# Patient Record
Sex: Female | Born: 1955 | Race: White | Hispanic: No | Marital: Single | Smoking: Current some day smoker
Health system: Southern US, Community
[De-identification: ages and names within clinical notes are randomized; demographics above are authoritative.]

## PROBLEM LIST (undated history)

## (undated) DIAGNOSIS — N289 Disorder of kidney and ureter, unspecified: Secondary | ICD-10-CM

## (undated) DIAGNOSIS — E119 Type 2 diabetes mellitus without complications: Secondary | ICD-10-CM

## (undated) DIAGNOSIS — D751 Secondary polycythemia: Secondary | ICD-10-CM

## (undated) DIAGNOSIS — I10 Essential (primary) hypertension: Secondary | ICD-10-CM

## (undated) DIAGNOSIS — E785 Hyperlipidemia, unspecified: Secondary | ICD-10-CM

## (undated) DIAGNOSIS — S37009A Unspecified injury of unspecified kidney, initial encounter: Secondary | ICD-10-CM

## (undated) HISTORY — DX: Unspecified injury of unspecified kidney, initial encounter: S37.009A

## (undated) HISTORY — PX: BREAST CYST EXCISION: SHX579

## (undated) HISTORY — PX: JOINT REPLACEMENT: SHX530

## (undated) HISTORY — DX: Disorder of kidney and ureter, unspecified: N28.9

## (undated) HISTORY — PX: BREAST CYST ASPIRATION: SHX578

---

## 2001-03-30 DIAGNOSIS — M25569 Pain in unspecified knee: Secondary | ICD-10-CM | POA: Insufficient documentation

## 2003-04-01 DIAGNOSIS — Z9114 Patient's other noncompliance with medication regimen: Secondary | ICD-10-CM | POA: Insufficient documentation

## 2004-05-30 ENCOUNTER — Emergency Department: Payer: Self-pay | Admitting: Emergency Medicine

## 2012-05-12 DIAGNOSIS — K222 Esophageal obstruction: Secondary | ICD-10-CM | POA: Insufficient documentation

## 2012-12-07 DIAGNOSIS — Z720 Tobacco use: Secondary | ICD-10-CM | POA: Insufficient documentation

## 2012-12-07 DIAGNOSIS — Z87898 Personal history of other specified conditions: Secondary | ICD-10-CM | POA: Insufficient documentation

## 2012-12-07 DIAGNOSIS — M549 Dorsalgia, unspecified: Secondary | ICD-10-CM | POA: Insufficient documentation

## 2012-12-07 DIAGNOSIS — Z0000 Encounter for general adult medical examination without abnormal findings: Secondary | ICD-10-CM | POA: Insufficient documentation

## 2012-12-07 DIAGNOSIS — F17200 Nicotine dependence, unspecified, uncomplicated: Secondary | ICD-10-CM | POA: Insufficient documentation

## 2012-12-07 DIAGNOSIS — F419 Anxiety disorder, unspecified: Secondary | ICD-10-CM | POA: Insufficient documentation

## 2012-12-07 DIAGNOSIS — I10 Essential (primary) hypertension: Secondary | ICD-10-CM | POA: Insufficient documentation

## 2013-02-05 ENCOUNTER — Emergency Department: Payer: Self-pay | Admitting: Emergency Medicine

## 2014-01-25 ENCOUNTER — Ambulatory Visit: Payer: Self-pay | Admitting: General Surgery

## 2014-02-02 ENCOUNTER — Ambulatory Visit: Payer: Self-pay | Admitting: General Surgery

## 2014-02-03 ENCOUNTER — Encounter: Payer: Self-pay | Admitting: *Deleted

## 2015-02-15 ENCOUNTER — Other Ambulatory Visit: Payer: Self-pay | Admitting: Orthopedic Surgery

## 2015-02-15 ENCOUNTER — Ambulatory Visit: Payer: Medicaid Other

## 2015-02-15 ENCOUNTER — Other Ambulatory Visit: Payer: Self-pay | Admitting: *Deleted

## 2015-02-15 DIAGNOSIS — M25571 Pain in right ankle and joints of right foot: Secondary | ICD-10-CM

## 2015-02-15 DIAGNOSIS — M25471 Effusion, right ankle: Secondary | ICD-10-CM

## 2015-02-15 DIAGNOSIS — M79671 Pain in right foot: Secondary | ICD-10-CM

## 2015-02-27 ENCOUNTER — Ambulatory Visit
Admission: RE | Admit: 2015-02-27 | Discharge: 2015-02-27 | Disposition: A | Discharge status: Home / Self Care | Payer: Medicaid Other | Source: Ambulatory Visit | Attending: Orthopedic Surgery | Admitting: Orthopedic Surgery

## 2015-02-27 DIAGNOSIS — M65871 Other synovitis and tenosynovitis, right ankle and foot: Secondary | ICD-10-CM | POA: Diagnosis not present

## 2015-02-27 DIAGNOSIS — M94271 Chondromalacia, right ankle and joints of right foot: Secondary | ICD-10-CM | POA: Insufficient documentation

## 2015-02-27 DIAGNOSIS — M25471 Effusion, right ankle: Secondary | ICD-10-CM

## 2015-02-27 DIAGNOSIS — M25571 Pain in right ankle and joints of right foot: Secondary | ICD-10-CM | POA: Diagnosis present

## 2015-10-23 ENCOUNTER — Emergency Department: Payer: Medicaid Other

## 2015-10-23 ENCOUNTER — Encounter: Payer: Self-pay | Admitting: Emergency Medicine

## 2015-10-23 ENCOUNTER — Emergency Department
Admission: EM | Admit: 2015-10-23 | Discharge: 2015-10-23 | Disposition: A | Discharge status: Home / Self Care | Payer: Medicaid Other | Attending: Emergency Medicine | Admitting: Emergency Medicine

## 2015-10-23 DIAGNOSIS — Z79899 Other long term (current) drug therapy: Secondary | ICD-10-CM | POA: Insufficient documentation

## 2015-10-23 DIAGNOSIS — I10 Essential (primary) hypertension: Secondary | ICD-10-CM | POA: Insufficient documentation

## 2015-10-23 DIAGNOSIS — N390 Urinary tract infection, site not specified: Secondary | ICD-10-CM | POA: Insufficient documentation

## 2015-10-23 DIAGNOSIS — F17210 Nicotine dependence, cigarettes, uncomplicated: Secondary | ICD-10-CM | POA: Diagnosis not present

## 2015-10-23 DIAGNOSIS — R600 Localized edema: Secondary | ICD-10-CM | POA: Diagnosis not present

## 2015-10-23 DIAGNOSIS — R2243 Localized swelling, mass and lump, lower limb, bilateral: Secondary | ICD-10-CM | POA: Diagnosis present

## 2015-10-23 HISTORY — DX: Essential (primary) hypertension: I10

## 2015-10-23 LAB — COMPREHENSIVE METABOLIC PANEL
ALT: 25 U/L (ref 14–54)
ANION GAP: 8 (ref 5–15)
AST: 36 U/L (ref 15–41)
Albumin: 3.8 g/dL (ref 3.5–5.0)
Alkaline Phosphatase: 85 U/L (ref 38–126)
BILIRUBIN TOTAL: 1.2 mg/dL (ref 0.3–1.2)
BUN: 10 mg/dL (ref 6–20)
CO2: 31 mmol/L (ref 22–32)
Calcium: 9.6 mg/dL (ref 8.9–10.3)
Chloride: 97 mmol/L — ABNORMAL LOW (ref 101–111)
Creatinine, Ser: 0.95 mg/dL (ref 0.44–1.00)
Glucose, Bld: 130 mg/dL — ABNORMAL HIGH (ref 65–99)
POTASSIUM: 4.6 mmol/L (ref 3.5–5.1)
Sodium: 136 mmol/L (ref 135–145)
TOTAL PROTEIN: 7.3 g/dL (ref 6.5–8.1)

## 2015-10-23 LAB — URINALYSIS COMPLETE WITH MICROSCOPIC (ARMC ONLY)
BILIRUBIN URINE: NEGATIVE
Bacteria, UA: NONE SEEN
Glucose, UA: NEGATIVE mg/dL
HGB URINE DIPSTICK: NEGATIVE
KETONES UR: NEGATIVE mg/dL
NITRITE: NEGATIVE
PH: 5 (ref 5.0–8.0)
PROTEIN: NEGATIVE mg/dL
Specific Gravity, Urine: 1.013 (ref 1.005–1.030)

## 2015-10-23 LAB — CBC WITH DIFFERENTIAL/PLATELET
BASOS ABS: 0 10*3/uL (ref 0–0.1)
Basophils Relative: 0 %
EOS PCT: 4 %
Eosinophils Absolute: 0.3 10*3/uL (ref 0–0.7)
HCT: 46.1 % (ref 35.0–47.0)
HEMOGLOBIN: 15.5 g/dL (ref 12.0–16.0)
LYMPHS PCT: 30 %
Lymphs Abs: 2.4 10*3/uL (ref 1.0–3.6)
MCH: 30.7 pg (ref 26.0–34.0)
MCHC: 33.7 g/dL (ref 32.0–36.0)
MCV: 91.1 fL (ref 80.0–100.0)
Monocytes Absolute: 0.9 10*3/uL (ref 0.2–0.9)
Monocytes Relative: 11 %
NEUTROS ABS: 4.5 10*3/uL (ref 1.4–6.5)
NEUTROS PCT: 55 %
PLATELETS: 202 10*3/uL (ref 150–440)
RBC: 5.06 MIL/uL (ref 3.80–5.20)
RDW: 12.8 % (ref 11.5–14.5)
WBC: 8 10*3/uL (ref 3.6–11.0)

## 2015-10-23 MED ORDER — SULFAMETHOXAZOLE-TRIMETHOPRIM 800-160 MG PO TABS: 1.0000 | ORAL_TABLET | Freq: Two times a day (BID) | ORAL | Status: DC

## 2015-10-23 NOTE — ED Provider Notes (Signed)
[facility] Emergency Department Provider Note  ____________________________________________  Time seen: Approximately 12:48 PM  I have reviewed the triage vital signs and the nursing notes.   HISTORY  Chief Complaint Leg Swelling   HPI Helen Benson is a 59 y.o. female is here with complaint of swelling to her bilateral lower extremities for several days. Patient denies any shortness of breath or chest pain. She states she currently takes a "fluid pill". She states she had an injury to her right ankle years ago which has had some swelling in it that is unusual for her left foot and ankle to be swollen. Patient admits to eating a lot of process foods and salt including "oodles of noodles".Patient continues to smoke one pack of cigarettes per day. Patient states that she is a patient at [facility] family practice but was unable to see her primary care doctor is "she was on vacation". Currently she rates her pain as a 0/10.     Past Medical History  Diagnosis Date  . Hypertension     There are no active problems to display for this patient.   Past Surgical History  Procedure Laterality Date  . Joint replacement      Current Outpatient Rx  Name  Route  Sig  Dispense  Refill  . ALPRAZolam (XANAX) 0.5 MG tablet   Oral   Take 0.5 mg by mouth 3 (three) times daily as needed for anxiety.         Marland Kitchen atenolol (TENORMIN) 25 MG tablet   Oral   Take 25 mg by mouth daily.         . enalapril (VASOTEC) 20 MG tablet   Oral   Take 20 mg by mouth 2 (two) times daily.         . hydrochlorothiazide (HYDRODIURIL) 25 MG tablet   Oral   Take 25 mg by mouth daily.         Marland Kitchen oxyCODONE (OXYCONTIN) 10 mg 12 hr tablet   Oral   Take 10 mg by mouth every 12 (twelve) hours.         . Oxycodone HCl 10 MG TABS   Oral   Take 10 mg by mouth 3 (three) times daily.         Marland Kitchen levothyroxine (SYNTHROID, LEVOTHROID) 112 MCG tablet   Oral   Take 112 mcg by  mouth daily before breakfast.         . sulfamethoxazole-trimethoprim (BACTRIM DS,SEPTRA DS) 800-160 MG tablet   Oral   Take 1 tablet by mouth 2 (two) times daily.   20 tablet   0     Allergies Review of patient's allergies indicates no known allergies.  No family history on file.  Social History Social History  Substance Use Topics  . Smoking status: Current Every Day Smoker -- 1.00 packs/day    Types: Cigarettes  . Smokeless tobacco: None  . Alcohol Use: No    Review of Systems Constitutional: No fever/chills ENT: No sore throat. Cardiovascular: Denies chest pain. Respiratory: Denies shortness of breath. Gastrointestinal: No abdominal pain.  No nausea, no vomiting.  No diarrhea.  No constipation. Genitourinary: Negative for dysuria. Musculoskeletal: Negative for back pain. Positive bilateral leg edema. Skin: Negative for rash. Neurological: Negative for headaches, focal weakness or numbness.  10-point ROS otherwise negative.  ____________________________________________   PHYSICAL EXAM:  VITAL SIGNS: ED Triage Vitals  Enc Vitals Group     BP 10/23/15 1217 145/77 mmHg  Pulse Rate 10/23/15 1217 71     Resp 10/23/15 1217 20     Temp 10/23/15 1217 98.3 F (36.8 C)     Temp Source 10/23/15 1217 Oral     SpO2 10/23/15 1217 95 %     Weight 10/23/15 1217 208 lb (94.348 kg)     Height 10/23/15 1217 5' 3.5" (1.613 m)     Head Cir --      Peak Flow --      Pain Score 10/23/15 1218 0     Pain Loc --      Pain Edu? --      Excl. in [place]? --     Constitutional: Alert and oriented. Well appearing and in no acute distress. Eyes: Conjunctivae are normal. PERRL. EOMI. Head: Atraumatic. Nose: No congestion/rhinnorhea. Mouth/Throat: Mucous membranes are moist.  Oropharynx non-erythematous. Neck: No stridor.   Cardiovascular: Normal rate, regular rhythm. Grossly normal heart sounds.  Good peripheral circulation. Respiratory: Normal respiratory effort.  No  retractions. Lungs Rare expiratory wheeze which clears with coughing. Gastrointestinal: Soft and nontender. No distention. Bowel sounds normoactive 4 quadrants. Musculoskeletal: Bilateral lower extremities with nonpitting edema present. Neurologic:  Normal speech and language. No gross focal neurologic deficits are appreciated. No gait instability. Skin:  Skin is warm, dry and intact. No rash noted. Psychiatric: Mood and affect are normal. Speech and behavior are normal.  ____________________________________________   LABS (all labs ordered are listed, but only abnormal results are displayed)  Labs Reviewed  URINALYSIS COMPLETEWITH MICROSCOPIC ([place]) - Abnormal; Notable for the following:    Color, Urine YELLOW (*)    APPearance CLEAR (*)    Leukocytes, UA 2+ (*)    Squamous Epithelial / LPF 0-5 (*)    All other components within normal limits  COMPREHENSIVE METABOLIC PANEL - Abnormal; Notable for the following:    Chloride 97 (*)    Glucose, Bld 130 (*)    All other components within normal limits  CBC WITH DIFFERENTIAL/PLATELET     RADIOLOGY  Chest x-ray per radiologist shows no active cardiopulmonary disease. ____________________________________________   PROCEDURES  Procedure(s) performed: None  Critical Care performed: No  ____________________________________________   INITIAL IMPRESSION / ASSESSMENT AND PLAN / ED COURSE  Pertinent labs & imaging results that were available during my care of the patient were reviewed by me and considered in my medical decision making (see chart for details).  Patient was given the information about her chest x-ray and lab work. Patient appears to have a urinary tract infection with 2+ leukocytes and 6-30 WBCs. Patient was placed on Bactrim twice a day for 10 days. She is to follow-up with her primary care doctor if any continued problems. She is also encouraged to drink fluids. We also discussed the decrease in heavy sodium  items that she is eating and elevation of her legs for edema. She is continue her medications as regular until seen by her primary care doctor. ____________________________________________   FINAL CLINICAL IMPRESSION(S) / ED DIAGNOSES  Final diagnoses:  Acute UTI (urinary tract infection)  Edema of extremities      NEW MEDICATIONS STARTED DURING THIS VISIT:  Discharge Medication List as of 10/23/2015  2:14 PM    START taking these medications   Details  sulfamethoxazole-trimethoprim (BACTRIM DS,SEPTRA DS) 800-160 MG tablet Take 1 tablet by mouth 2 (two) times daily., Starting 10/23/2015, Until Discontinued, Print         Note:  This document was prepared using Dragon voice recognition  software and may include unintentional dictation errors.    Johnn Hai, PA-C 10/23/15 [place], MD 10/26/15 2039

## 2015-10-23 NOTE — Discharge Instructions (Signed)
Follow-up with her primary care doctor. Call tomorrow to make an appointment. Continue regular medication. Increase fluids. Avoid food with high sodium content. Begin taking Bactrim DS twice a day for 10 days for urinary tract infection. Have this rechecked at your primary care doctor's office to make sure your infection cleared. Elevate legs often to help reduce swelling.

## 2015-10-23 NOTE — ED Notes (Signed)
Patient presents to the ED with bilateral feet swelling.  Patient denies pain and denies shortness of breath.  Patient states she takes a fluid pill daily.  When asked about recent sodium intake patient states, "does oodles of noodles have a lot of salt?".  Patient reports fixed income and eating more processed food at the end of the month.  Patient is alert and oriented x 4.  No obvious distress at this time.  Feet appear swollen but no pitting edema noted, no discoloration and no skin break down.

## 2015-10-23 NOTE — ED Notes (Signed)
Patient transported to XR. 

## 2015-10-23 NOTE — ED Notes (Signed)
States swelling to bilateral lower extremites for several days.. Denies any SOB  Or chest pain   Pt is currently taking fluid pills w/o relief

## 2016-09-24 DIAGNOSIS — B182 Chronic viral hepatitis C: Secondary | ICD-10-CM | POA: Insufficient documentation

## 2016-11-28 ENCOUNTER — Other Ambulatory Visit: Payer: Self-pay | Admitting: Student

## 2016-11-28 DIAGNOSIS — B1920 Unspecified viral hepatitis C without hepatic coma: Secondary | ICD-10-CM

## 2016-12-06 ENCOUNTER — Ambulatory Visit
Admission: RE | Admit: 2016-12-06 | Discharge: 2016-12-06 | Disposition: A | Discharge status: Home / Self Care | Payer: Medicaid Other | Source: Ambulatory Visit | Attending: Student | Admitting: Student

## 2016-12-06 DIAGNOSIS — B1920 Unspecified viral hepatitis C without hepatic coma: Secondary | ICD-10-CM | POA: Insufficient documentation

## 2017-03-03 ENCOUNTER — Inpatient Hospital Stay: Payer: Medicaid Other | Attending: Oncology | Admitting: Oncology

## 2019-04-01 ENCOUNTER — Other Ambulatory Visit: Payer: Self-pay | Admitting: Family Medicine

## 2019-04-01 DIAGNOSIS — Z1231 Encounter for screening mammogram for malignant neoplasm of breast: Secondary | ICD-10-CM

## 2019-04-15 ENCOUNTER — Inpatient Hospital Stay
Admission: RE | Admit: 2019-04-15 | Discharge: 2019-04-15 | Disposition: A | Discharge status: Home / Self Care | Payer: Self-pay | Source: Ambulatory Visit | Attending: *Deleted | Admitting: *Deleted

## 2019-04-15 ENCOUNTER — Other Ambulatory Visit: Payer: Self-pay | Admitting: *Deleted

## 2019-04-15 DIAGNOSIS — Z1231 Encounter for screening mammogram for malignant neoplasm of breast: Secondary | ICD-10-CM

## 2019-04-27 ENCOUNTER — Other Ambulatory Visit: Payer: Self-pay | Admitting: Family Medicine

## 2019-04-27 DIAGNOSIS — N6310 Unspecified lump in the right breast, unspecified quadrant: Secondary | ICD-10-CM

## 2019-04-29 ENCOUNTER — Ambulatory Visit
Admission: RE | Admit: 2019-04-29 | Discharge: 2019-04-29 | Disposition: A | Discharge status: Home / Self Care | Payer: Medicaid Other | Source: Ambulatory Visit | Attending: Family Medicine | Admitting: Family Medicine

## 2019-04-29 DIAGNOSIS — N6310 Unspecified lump in the right breast, unspecified quadrant: Secondary | ICD-10-CM

## 2019-04-29 DIAGNOSIS — N6311 Unspecified lump in the right breast, upper outer quadrant: Secondary | ICD-10-CM | POA: Insufficient documentation

## 2019-04-29 DIAGNOSIS — N6315 Unspecified lump in the right breast, overlapping quadrants: Secondary | ICD-10-CM | POA: Insufficient documentation

## 2019-09-14 ENCOUNTER — Other Ambulatory Visit: Payer: Self-pay | Admitting: Family Medicine

## 2019-09-14 ENCOUNTER — Other Ambulatory Visit (HOSPITAL_COMMUNITY): Payer: Self-pay | Admitting: Family Medicine

## 2019-09-14 DIAGNOSIS — R1011 Right upper quadrant pain: Secondary | ICD-10-CM

## 2019-09-14 DIAGNOSIS — R109 Unspecified abdominal pain: Secondary | ICD-10-CM

## 2019-09-14 DIAGNOSIS — R110 Nausea: Secondary | ICD-10-CM

## 2019-09-27 ENCOUNTER — Encounter: Admission: RE | Admit: 2019-09-27 | Payer: Medicaid Other | Source: Ambulatory Visit

## 2019-09-30 ENCOUNTER — Encounter
Admission: RE | Admit: 2019-09-30 | Discharge: 2019-09-30 | Disposition: A | Discharge status: Home / Self Care | Payer: Medicaid Other | Source: Ambulatory Visit | Attending: Family Medicine | Admitting: Family Medicine

## 2019-09-30 ENCOUNTER — Other Ambulatory Visit: Payer: Self-pay

## 2019-09-30 DIAGNOSIS — R110 Nausea: Secondary | ICD-10-CM

## 2019-09-30 DIAGNOSIS — R1011 Right upper quadrant pain: Secondary | ICD-10-CM

## 2019-09-30 DIAGNOSIS — R109 Unspecified abdominal pain: Secondary | ICD-10-CM | POA: Diagnosis not present

## 2019-09-30 MED ORDER — TECHNETIUM TC 99M MEBROFENIN IV KIT
5.0000 | PACK | Freq: Once | INTRAVENOUS | Status: AC | PRN
Start: 2019-09-30 — End: 2019-09-30
  Administered 2019-09-30: 08:00:00 5.49 via INTRAVENOUS

## 2020-02-28 ENCOUNTER — Other Ambulatory Visit: Payer: Self-pay | Admitting: Physician Assistant

## 2020-02-28 ENCOUNTER — Ambulatory Visit
Admission: RE | Admit: 2020-02-28 | Discharge: 2020-02-28 | Disposition: A | Discharge status: Home / Self Care | Payer: Medicaid Other | Source: Ambulatory Visit | Attending: Physician Assistant | Admitting: Physician Assistant

## 2020-02-28 ENCOUNTER — Ambulatory Visit
Admission: RE | Admit: 2020-02-28 | Discharge: 2020-02-28 | Disposition: A | Discharge status: Home / Self Care | Payer: Medicaid Other | Attending: Physician Assistant | Admitting: Physician Assistant

## 2020-02-28 DIAGNOSIS — M545 Low back pain, unspecified: Secondary | ICD-10-CM

## 2020-06-06 ENCOUNTER — Other Ambulatory Visit: Payer: Self-pay | Admitting: Family Medicine

## 2020-06-06 DIAGNOSIS — Z1231 Encounter for screening mammogram for malignant neoplasm of breast: Secondary | ICD-10-CM

## 2020-09-14 IMAGING — MG DIGITAL DIAGNOSTIC BILAT W/ TOMO W/ CAD
8 of 15 series · 8 of 40 positions shown · non-contrast
Comparison: Previous exam(s).

CLINICAL DATA: The patient presents with multiple palpable lumps in
both breast.

EXAM:
DIGITAL DIAGNOSTIC BILATERAL MAMMOGRAM WITH CAD AND TOMO
ULTRASOUND BILATERAL BREAST

[R CC synth-2D]
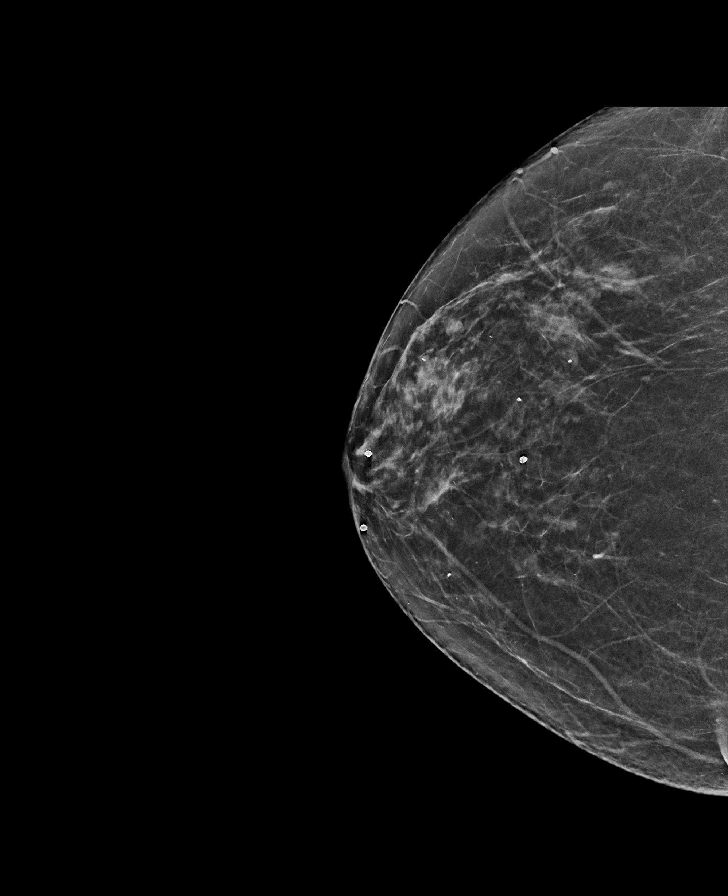

[L MLO synth-2D]
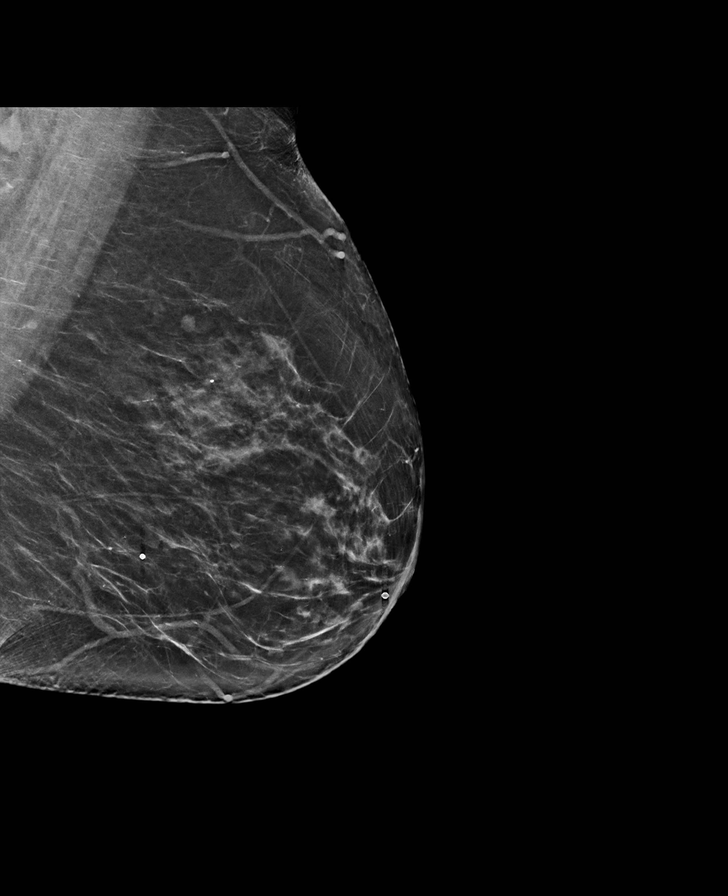

[L CC synth-2D]
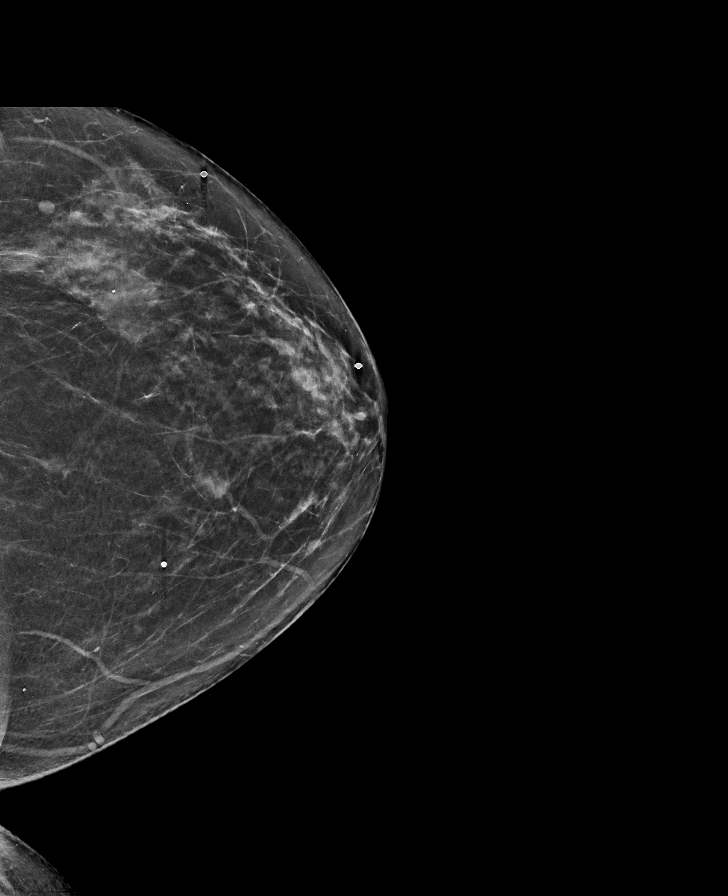

[L TAN synth-2D (1 of 2)]
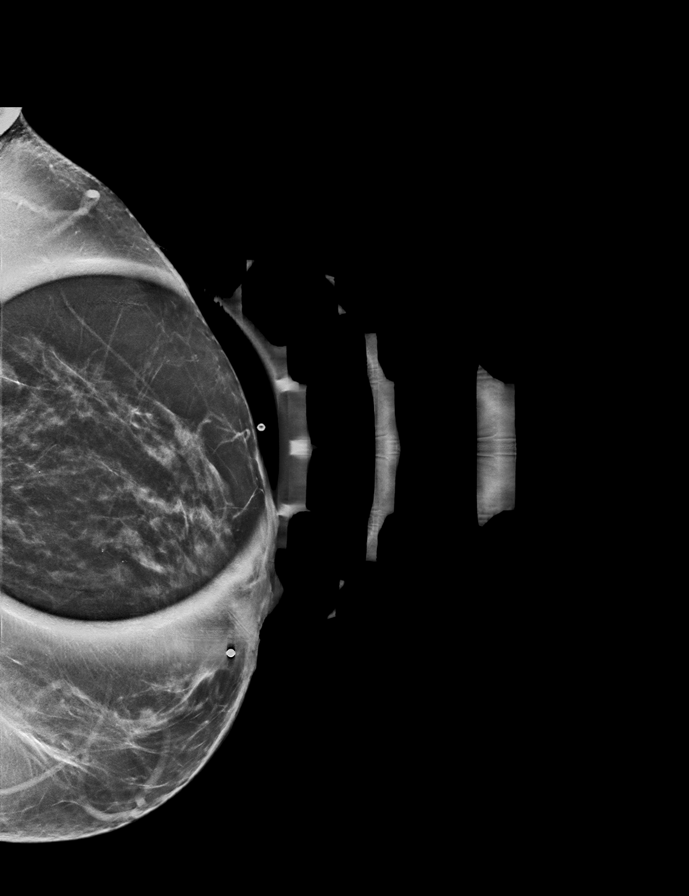

[R MLO synth-2D]
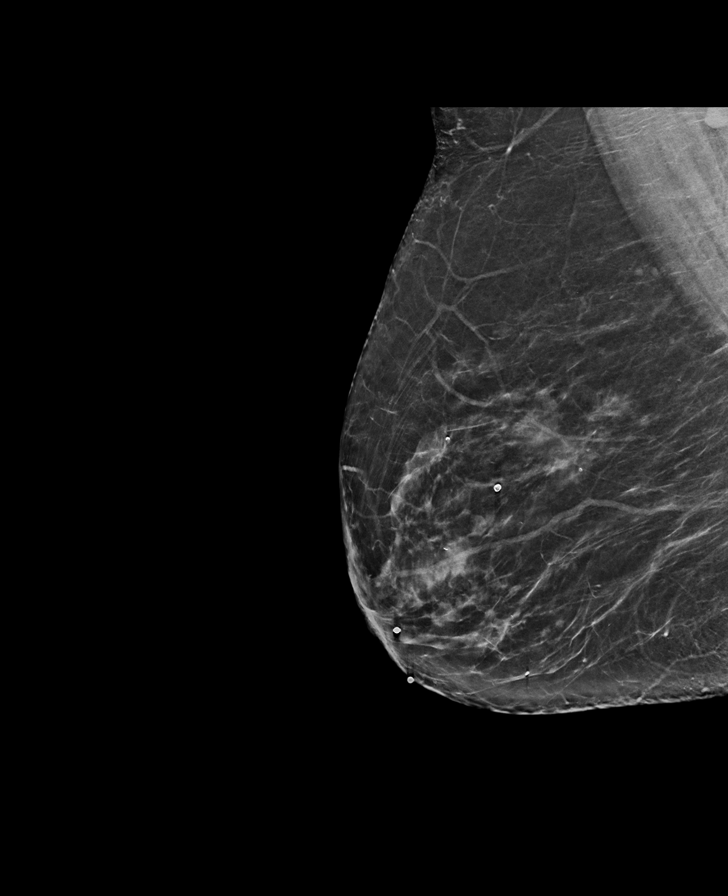

[L TAN synth-2D (2 of 2)]
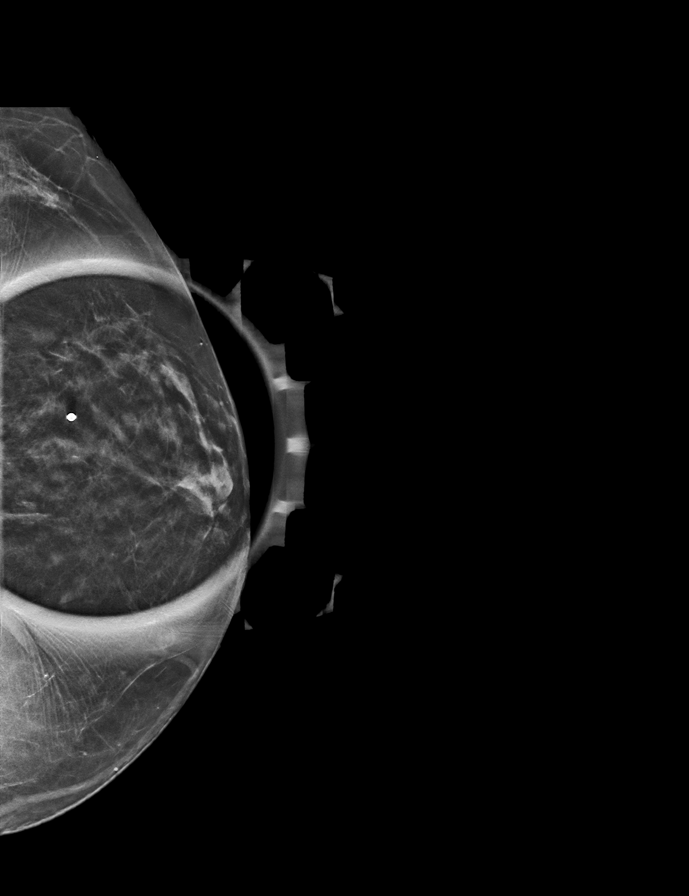

[R TAN synth-2D]
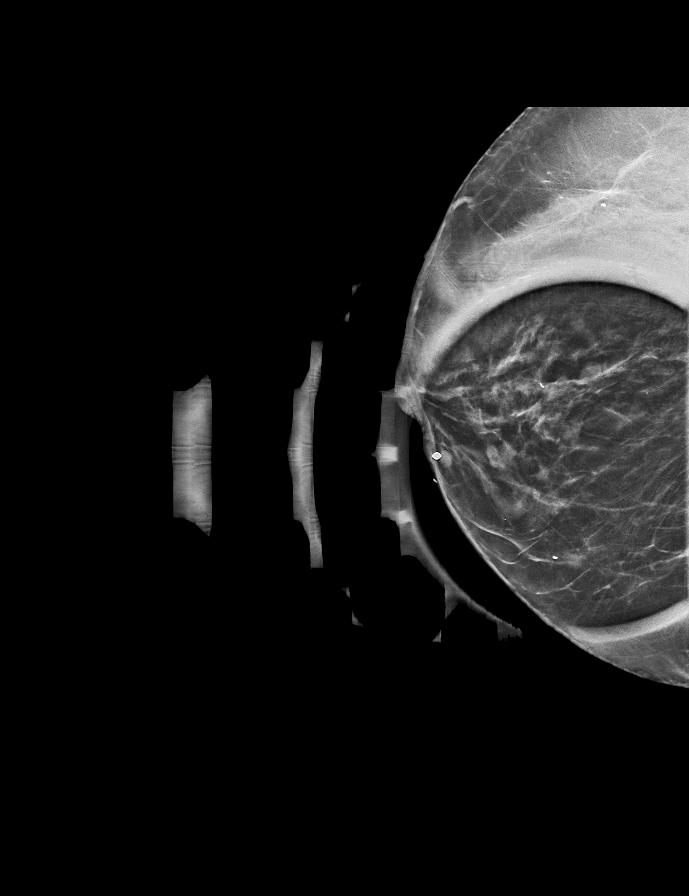

[L TAN tomo · tomo slice 28/41.0]
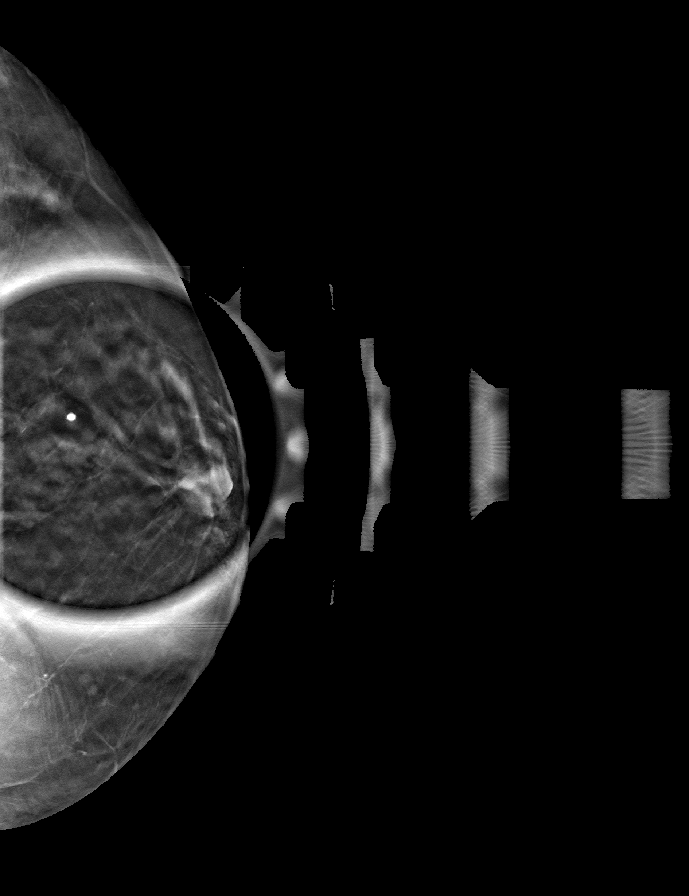

[8 of 40 positions shown; findings below may reference images not displayed]

ACR Breast Density Category c: The breast tissue is heterogeneously
dense, which may obscure small masses.
FINDINGS: There is a new mass in the lateral inferior right breast which does
not correlate with any of the palpable lumps. No other mammographic
abnormalities are identified bilaterally.

Mammographic images were processed with CAD.

On physical exam, no suspicious lumps are identified.

Targeted ultrasound is performed, showing a mildly complicated cyst
in the right breast at 8 o'clock correlating with the new mass
identified. No sonographic correlate for the patient's palpable
lumps.
IMPRESSION: Fibrocystic changes.  No evidence of malignancy.

RECOMMENDATION:
Annual screening mammography.

I have discussed the findings and recommendations with the patient.
If applicable, a reminder letter will be sent to the patient
regarding the next appointment.

BI-RADS CATEGORY  2: Benign.

## 2021-01-08 ENCOUNTER — Ambulatory Visit: Payer: Medicare Other

## 2021-01-08 ENCOUNTER — Inpatient Hospital Stay: Payer: Medicare Other | Attending: Oncology | Admitting: Oncology

## 2021-01-08 ENCOUNTER — Other Ambulatory Visit: Payer: Self-pay

## 2021-01-08 ENCOUNTER — Encounter: Payer: Self-pay | Admitting: Oncology

## 2021-01-08 ENCOUNTER — Inpatient Hospital Stay: Payer: Medicare Other | Admitting: Oncology

## 2021-01-08 ENCOUNTER — Inpatient Hospital Stay: Payer: Medicare Other

## 2021-01-08 VITALS — BP 173/78 | HR 66 | Temp 98.5°F | Resp 18 | Wt 214.0 lb

## 2021-01-08 DIAGNOSIS — I82409 Acute embolism and thrombosis of unspecified deep veins of unspecified lower extremity: Secondary | ICD-10-CM | POA: Insufficient documentation

## 2021-01-08 DIAGNOSIS — Z79899 Other long term (current) drug therapy: Secondary | ICD-10-CM | POA: Diagnosis not present

## 2021-01-08 DIAGNOSIS — Z720 Tobacco use: Secondary | ICD-10-CM

## 2021-01-08 DIAGNOSIS — D751 Secondary polycythemia: Secondary | ICD-10-CM | POA: Insufficient documentation

## 2021-01-08 DIAGNOSIS — I10 Essential (primary) hypertension: Secondary | ICD-10-CM | POA: Diagnosis not present

## 2021-01-08 DIAGNOSIS — E139 Other specified diabetes mellitus without complications: Secondary | ICD-10-CM | POA: Insufficient documentation

## 2021-01-08 DIAGNOSIS — Z803 Family history of malignant neoplasm of breast: Secondary | ICD-10-CM | POA: Insufficient documentation

## 2021-01-08 DIAGNOSIS — F17210 Nicotine dependence, cigarettes, uncomplicated: Secondary | ICD-10-CM | POA: Insufficient documentation

## 2021-01-08 LAB — COMPREHENSIVE METABOLIC PANEL
ALT: 16 U/L (ref 0–44)
AST: 14 U/L — ABNORMAL LOW (ref 15–41)
Albumin: 4 g/dL (ref 3.5–5.0)
Alkaline Phosphatase: 68 U/L (ref 38–126)
Anion gap: 11 (ref 5–15)
BUN: 22 mg/dL (ref 8–23)
CO2: 31 mmol/L (ref 22–32)
Calcium: 10 mg/dL (ref 8.9–10.3)
Chloride: 93 mmol/L — ABNORMAL LOW (ref 98–111)
Creatinine, Ser: 1.06 mg/dL — ABNORMAL HIGH (ref 0.44–1.00)
GFR, Estimated: 58 mL/min — ABNORMAL LOW (ref >60)
Glucose, Bld: 169 mg/dL — ABNORMAL HIGH (ref 70–99)
Potassium: 3 mmol/L — ABNORMAL LOW (ref 3.5–5.1)
Sodium: 135 mmol/L (ref 135–145)
Total Bilirubin: 0.4 mg/dL (ref 0.3–1.2)
Total Protein: 7.5 g/dL (ref 6.5–8.1)

## 2021-01-08 LAB — CBC WITH DIFFERENTIAL/PLATELET
Abs Immature Granulocytes: 0.02 10*3/uL (ref 0.00–0.07)
Basophils Absolute: 0.1 10*3/uL (ref 0.0–0.1)
Basophils Relative: 1 %
Eosinophils Absolute: 0.2 10*3/uL (ref 0.0–0.5)
Eosinophils Relative: 2 %
HCT: 45.5 % (ref 36.0–46.0)
Hemoglobin: 15.5 g/dL — ABNORMAL HIGH (ref 12.0–15.0)
Immature Granulocytes: 0 %
Lymphocytes Relative: 31 %
Lymphs Abs: 3.1 10*3/uL (ref 0.7–4.0)
MCH: 31.9 pg (ref 26.0–34.0)
MCHC: 34.1 g/dL (ref 30.0–36.0)
MCV: 93.6 fL (ref 80.0–100.0)
Monocytes Absolute: 0.8 10*3/uL (ref 0.1–1.0)
Monocytes Relative: 8 %
Neutro Abs: 5.7 10*3/uL (ref 1.7–7.7)
Neutrophils Relative %: 58 %
Platelets: 271 10*3/uL (ref 150–400)
RBC: 4.86 MIL/uL (ref 3.87–5.11)
RDW: 12.4 % (ref 11.5–15.5)
WBC: 10 10*3/uL (ref 4.0–10.5)
nRBC: 0 % (ref 0.0–0.2)

## 2021-01-08 NOTE — Progress Notes (Signed)
Patient here for initial oncology appointment, expresses concerns of fatigue 

## 2021-01-08 NOTE — Progress Notes (Signed)
Hematology/Oncology Consult note [facility] [phone] [phone]   Patient Care Team: Remi Haggard, FNP as PCP - General (Family Medicine)  REFERRING PROVIDER: Remi Haggard, FNP  CHIEF COMPLAINTS/REASON FOR VISIT:  Evaluation of polycytosis/erythrocytosis  HISTORY OF PRESENTING ILLNESS:  Helen Benson is a 65 y.o. female who was seen in consultation at the request of Remi Haggard, FNP for evaluation of polycytosis/erythrocytosis Reviewed patient's recent lab work which was obtain by PCP.  Labs showed elevated hemoglobin at 16s,   Denies weight loss, fever, chills, fatigue, night sweats.   Smoking history: current every day smoker, 1 pack per day.  History of blood clots: remote provoked DVT after knee surgery, anticoagulation with coumadin.   Review of Systems  Constitutional:  Negative for appetite change, chills, fatigue and fever.  HENT:   Negative for hearing loss and voice change.   Eyes:  Negative for eye problems.  Respiratory:  Negative for chest tightness and cough.   Cardiovascular:  Negative for chest pain.  Gastrointestinal:  Negative for abdominal distention, abdominal pain and blood in stool.  Endocrine: Negative for hot flashes.  Genitourinary:  Negative for difficulty urinating and frequency.   Musculoskeletal:  Negative for arthralgias.  Skin:  Negative for itching and rash.  Neurological:  Negative for extremity weakness.  Hematological:  Negative for adenopathy.  Psychiatric/Behavioral:  Negative for confusion.    MEDICAL HISTORY:  Past Medical History:  Diagnosis Date   Hypertension    Kidney damage     SURGICAL HISTORY: Past Surgical History:  Procedure Laterality Date   BREAST CYST ASPIRATION Bilateral    Bilat. many on both per pt   BREAST CYST EXCISION Left    age 34   JOINT REPLACEMENT      SOCIAL HISTORY: Social History   Socioeconomic History   Marital status: Single     Spouse name: Not on file   Number of children: Not on file   Years of education: Not on file   Highest education level: Not on file  Occupational History   Not on file  Tobacco Use   Smoking status: Every Day    Packs/day: 1.00    Types: Cigarettes   Smokeless tobacco: Not on file  Substance and Sexual Activity   Alcohol use: No   Drug use: Not on file   Sexual activity: Not on file  Other Topics Concern   Not on file  Social History Narrative   Not on file   Social Determinants of Health   Financial Resource Strain: Not on file  Food Insecurity: Not on file  Transportation Needs: Not on file  Physical Activity: Not on file  Stress: Not on file  Social Connections: Not on file  Intimate Partner Violence: Not on file    FAMILY HISTORY: Family History  Problem Relation Age of Onset   Breast cancer Sister 60   Breast cancer Other 42       ne[place]    ALLERGIES:  has No Known Allergies.  MEDICATIONS:  Current Outpatient Medications  Medication Sig Dispense Refill   ALPRAZolam (XANAX) 0.5 MG tablet Take 0.5 mg by mouth 3 (three) times daily as needed for anxiety.     atenolol (TENORMIN) 25 MG tablet Take 25 mg by mouth daily.     atorvastatin (LIPITOR) 20 MG tablet Take 20 mg by mouth daily.     chlorthalidone (HYGROTON) 50 MG tablet Take 50 mg by mouth daily.  hydrochlorothiazide (HYDRODIURIL) 25 MG tablet Take 25 mg by mouth daily.     Investigational vitamin D 600 UNITS capsule SWOG S0812 Take 600 Units by mouth daily. Take with food.     levothyroxine (SYNTHROID, LEVOTHROID) 112 MCG tablet Take 112 mcg by mouth daily before breakfast.     oxyCODONE (OXYCONTIN) 10 mg 12 hr tablet Take 10 mg by mouth every 12 (twelve) hours.     Oxycodone HCl 10 MG TABS Take 10 mg by mouth 3 (three) times daily.     enalapril (VASOTEC) 20 MG tablet Take 20 mg by mouth 2 (two) times daily. (Patient not taking: Reported on 01/08/2021)     sulfamethoxazole-trimethoprim (BACTRIM  DS,SEPTRA DS) 800-160 MG tablet Take 1 tablet by mouth 2 (two) times daily. (Patient not taking: Reported on 01/08/2021) 20 tablet 0   No current facility-administered medications for this visit.     PHYSICAL EXAMINATION: ECOG PERFORMANCE STATUS: 0 - Asymptomatic Vitals:   01/08/21 1555  BP: (!) 173/78  Pulse: 66  Resp: 18  Temp: 98.5 F (36.9 C)  SpO2: 98%   Filed Weights   01/08/21 1555  Weight: 214 lb (97.1 kg)    Physical Exam Constitutional:      General: She is not in acute distress. HENT:     Head: Normocephalic and atraumatic.  Eyes:     General: No scleral icterus. Cardiovascular:     Rate and Rhythm: Normal rate and regular rhythm.     Heart sounds: Normal heart sounds.  Pulmonary:     Effort: Pulmonary effort is normal. No respiratory distress.     Breath sounds: No wheezing.  Abdominal:     General: Bowel sounds are normal. There is no distension.     Palpations: Abdomen is soft.  Musculoskeletal:        General: No deformity. Normal range of motion.     Cervical back: Normal range of motion and neck supple.  Skin:    General: Skin is warm and dry.     Findings: No erythema or rash.  Neurological:     Mental Status: She is alert and oriented to person, place, and time. Mental status is at baseline.     Cranial Nerves: No cranial nerve deficit.     Coordination: Coordination normal.  Psychiatric:        Mood and Affect: Mood normal.    RADIOGRAPHIC STUDIES: I have personally reviewed the radiological images as listed and agreed with the findings in the report. No results found.   LABORATORY DATA:  I have reviewed the data as listed Lab Results  Component Value Date   WBC 10.0 01/08/2021   HGB 15.5 (H) 01/08/2021   HCT 45.5 01/08/2021   MCV 93.6 01/08/2021   PLT 271 01/08/2021   Recent Labs    01/08/21 1626  NA 135  K 3.0*  CL 93*  CO2 31  GLUCOSE 169*  BUN 22  CREATININE 1.06*  CALCIUM 10.0  GFRNONAA 58*  PROT 7.5  ALBUMIN 4.0   AST 14*  ALT 16  ALKPHOS 68  BILITOT 0.4   Iron/TIBC/Ferritin/ %Sat No results found for: IRON, TIBC, FERRITIN, IRONPCTSAT      ASSESSMENT & PLAN:  1. Erythrocytosis   2. Tobacco abuse   Labs are reviewed and discussed with patient. Polycythemia (erythrocytosis) is an abnormal elevation of hemoglobin (Hgb) and/or hematocrit (Hct) in peripheral blood, and this can be caused by primary etiology, ie bone marrow mutation, or secondary etiology, ie  hypoxia, chronic lung disease, sleep apnea, smoking etc.  I will obtain erythropoietin, carbo monoxide level, rule out primary etiology, JAK2 with reflex to other mutations, BCR-ABL.    Tobacco use, smoke cessation was discussed  Orders Placed This Encounter  Procedures   CBC with Differential/Platelet    Standing Status:   Future    Number of Occurrences:   1    Standing Expiration Date:   01/08/2022   Comprehensive metabolic panel    Standing Status:   Future    Number of Occurrences:   1    Standing Expiration Date:   01/08/2022   JAK2 V617F, w Reflex to CALR/E12/MPL    Standing Status:   Future    Number of Occurrences:   1    Standing Expiration Date:   01/08/2022   BCR-ABL1 FISH    Standing Status:   Future    Number of Occurrences:   1    Standing Expiration Date:   01/08/2022   Carbon monoxide, blood (performed at ref lab)    Standing Status:   Future    Number of Occurrences:   1    Standing Expiration Date:   01/08/2022   Erythropoietin    Standing Status:   Future    Number of Occurrences:   1    Standing Expiration Date:   01/08/2022    We spent sufficient time to discuss many aspect of care, questions were answered to patient's satisfaction. The patient knows to call the clinic with any problems questions or concerns.  Cc Remi Haggard, FNP  Return of visit: 2 weeks Thank you for this kind referral and the opportunity to participate in the care of this patient. A copy of today's note is routed to referring  provider    Earlie Server, MD, PhD 01/08/2021

## 2021-01-09 LAB — ERYTHROPOIETIN: Erythropoietin: 25.4 m[IU]/mL — ABNORMAL HIGH (ref 2.6–18.5)

## 2021-01-09 LAB — CARBON MONOXIDE, BLOOD (PERFORMED AT REF LAB): Carbon Monoxide, Blood: 9.7 % — ABNORMAL HIGH (ref 0.0–3.6)

## 2021-01-11 LAB — BCR-ABL1 FISH
Cells Analyzed: 200
Cells Counted: 200

## 2021-01-16 LAB — CALR + JAK2 E12-15 + MPL (REFLEXED)

## 2021-01-16 LAB — JAK2 V617F, W REFLEX TO CALR/E12/MPL

## 2021-01-22 ENCOUNTER — Inpatient Hospital Stay: Payer: Medicare Other | Admitting: Oncology

## 2021-01-24 ENCOUNTER — Inpatient Hospital Stay: Payer: Medicare Other | Admitting: Oncology

## 2021-02-13 ENCOUNTER — Encounter: Payer: Self-pay | Admitting: Oncology

## 2021-02-13 ENCOUNTER — Inpatient Hospital Stay: Payer: Medicare Other | Attending: Oncology | Admitting: Oncology

## 2021-02-13 VITALS — BP 167/84 | HR 71 | Temp 97.8°F | Resp 20 | Wt 221.5 lb

## 2021-02-13 DIAGNOSIS — Z803 Family history of malignant neoplasm of breast: Secondary | ICD-10-CM | POA: Diagnosis not present

## 2021-02-13 DIAGNOSIS — Z79899 Other long term (current) drug therapy: Secondary | ICD-10-CM | POA: Diagnosis not present

## 2021-02-13 DIAGNOSIS — I10 Essential (primary) hypertension: Secondary | ICD-10-CM | POA: Diagnosis not present

## 2021-02-13 DIAGNOSIS — D751 Secondary polycythemia: Secondary | ICD-10-CM

## 2021-02-13 DIAGNOSIS — F17210 Nicotine dependence, cigarettes, uncomplicated: Secondary | ICD-10-CM | POA: Insufficient documentation

## 2021-02-13 DIAGNOSIS — Z720 Tobacco use: Secondary | ICD-10-CM

## 2021-02-13 NOTE — Progress Notes (Signed)
Hematology/Oncology follow up note [facility] [phone] [phone]   Patient Care Team: Remi Haggard, FNP as PCP - General (Family Medicine)  REFERRING PROVIDER: Remi Haggard, FNP  CHIEF COMPLAINTS/REASON FOR VISIT:   polycytosis/erythrocytosis  HISTORY OF PRESENTING ILLNESS:  Helen Benson is a 65 y.o. female who was seen in consultation at the request of Remi Haggard, FNP for evaluation of polycytosis/erythrocytosis Reviewed patient's recent lab work which was obtain by PCP.  Labs showed elevated hemoglobin at 16s,   Denies weight loss, fever, chills, fatigue, night sweats.   Smoking history: current every day smoker, 1 pack per day.  History of blood clots: remote provoked DVT after knee surgery, anticoagulation with coumadin.  INTERVAL HISTORY Helen Benson is a 64 y.o. female who has above history reviewed by me today presents for follow up visit for erythrocytosis. She had blood work done and presents to discuss results. No new complaints.     Review of Systems  Constitutional:  Negative for appetite change, chills, fatigue and fever.  HENT:   Negative for hearing loss and voice change.   Eyes:  Negative for eye problems.  Respiratory:  Negative for chest tightness and cough.   Cardiovascular:  Negative for chest pain.  Gastrointestinal:  Negative for abdominal distention, abdominal pain and blood in stool.  Endocrine: Negative for hot flashes.  Genitourinary:  Negative for difficulty urinating and frequency.   Musculoskeletal:  Negative for arthralgias.  Skin:  Negative for itching and rash.  Neurological:  Negative for extremity weakness.  Hematological:  Negative for adenopathy.  Psychiatric/Behavioral:  Negative for confusion.    MEDICAL HISTORY:  Past Medical History:  Diagnosis Date   Hypertension    Kidney damage     SURGICAL HISTORY: Past Surgical History:  Procedure Laterality Date    BREAST CYST ASPIRATION Bilateral    Bilat. many on both per pt   BREAST CYST EXCISION Left    age 35   JOINT REPLACEMENT      SOCIAL HISTORY: Social History   Socioeconomic History   Marital status: Single    Spouse name: Not on file   Number of children: Not on file   Years of education: Not on file   Highest education level: Not on file  Occupational History   Not on file  Tobacco Use   Smoking status: Every Day    Packs/day: 1.00    Years: 40.00    Pack years: 40.00    Types: Cigarettes   Smokeless tobacco: Never  Substance and Sexual Activity   Alcohol use: No   Drug use: Not on file   Sexual activity: Not on file  Other Topics Concern   Not on file  Social History Narrative   Not on file   Social Determinants of Health   Financial Resource Strain: Not on file  Food Insecurity: Not on file  Transportation Needs: Not on file  Physical Activity: Not on file  Stress: Not on file  Social Connections: Not on file  Intimate Partner Violence: Not on file    FAMILY HISTORY: Family History  Problem Relation Age of Onset   Breast cancer Sister 59   Breast cancer Other 42       neice    ALLERGIES:  has No Known Allergies.  MEDICATIONS:  Current Outpatient Medications  Medication Sig Dispense Refill   ALPRAZolam (XANAX) 0.5 MG tablet Take 0.5 mg by mouth 3 (three) times daily as needed  for anxiety.     atenolol (TENORMIN) 25 MG tablet Take 25 mg by mouth daily.     chlorthalidone (HYGROTON) 50 MG tablet Take 50 mg by mouth daily.     Investigational vitamin D 600 UNITS capsule SWOG S0812 Take 600 Units by mouth daily. Take with food.     levothyroxine (SYNTHROID, LEVOTHROID) 112 MCG tablet Take 70 mcg by mouth daily before breakfast.     losartan (COZAAR) 50 MG tablet losartan 50 mg tablet     naloxone (NARCAN) nasal spray 4 mg/0.1 mL      oxyCODONE ER (XTAMPZA ER) 18 MG C12A Xtampza ER 18 mg capsule sprinkle     Oxycodone HCl 10 MG TABS Take 10 mg by mouth 3  (three) times daily.     atorvastatin (LIPITOR) 20 MG tablet Take 20 mg by mouth daily.     enalapril (VASOTEC) 20 MG tablet Take 20 mg by mouth 2 (two) times daily. (Patient not taking: Reported on 01/08/2021)     hydrochlorothiazide (HYDRODIURIL) 25 MG tablet Take 25 mg by mouth daily.     oxyCODONE (OXYCONTIN) 10 mg 12 hr tablet Take 10 mg by mouth every 12 (twelve) hours.     sulfamethoxazole-trimethoprim (BACTRIM DS,SEPTRA DS) 800-160 MG tablet Take 1 tablet by mouth 2 (two) times daily. (Patient not taking: Reported on 01/08/2021) 20 tablet 0   No current facility-administered medications for this visit.     PHYSICAL EXAMINATION: ECOG PERFORMANCE STATUS: 0 - Asymptomatic Vitals:   02/13/21 1409  BP: (!) 167/84  Pulse: 71  Resp: 20  Temp: 97.8 F (36.6 C)  SpO2: 100%   Filed Weights   02/13/21 1409  Weight: 221 lb 8 oz (100.5 kg)    Physical Exam Constitutional:      General: She is not in acute distress. HENT:     Head: Normocephalic and atraumatic.  Eyes:     General: No scleral icterus. Cardiovascular:     Rate and Rhythm: Normal rate and regular rhythm.     Heart sounds: Normal heart sounds.  Pulmonary:     Effort: Pulmonary effort is normal. No respiratory distress.     Breath sounds: No wheezing.  Abdominal:     General: Bowel sounds are normal. There is no distension.     Palpations: Abdomen is soft.  Musculoskeletal:        General: No deformity. Normal range of motion.     Cervical back: Normal range of motion and neck supple.  Skin:    General: Skin is warm and dry.     Findings: No erythema or rash.  Neurological:     Mental Status: She is alert and oriented to person, place, and time. Mental status is at baseline.     Cranial Nerves: No cranial nerve deficit.     Coordination: Coordination normal.  Psychiatric:        Mood and Affect: Mood normal.    RADIOGRAPHIC STUDIES: I have personally reviewed the radiological images as listed and agreed  with the findings in the report. No results found.   LABORATORY DATA:  I have reviewed the data as listed Lab Results  Component Value Date   WBC 10.0 01/08/2021   HGB 15.5 (H) 01/08/2021   HCT 45.5 01/08/2021   MCV 93.6 01/08/2021   PLT 271 01/08/2021   Recent Labs    01/08/21 1626  NA 135  K 3.0*  CL 93*  CO2 31  GLUCOSE 169*  BUN 22  CREATININE 1.06*  CALCIUM 10.0  GFRNONAA 58*  PROT 7.5  ALBUMIN 4.0  AST 14*  ALT 16  ALKPHOS 68  BILITOT 0.4    Iron/TIBC/Ferritin/ %Sat No results found for: IRON, TIBC, FERRITIN, IRONPCTSAT      ASSESSMENT & PLAN:  1. Erythrocytosis   2. Tobacco abuse    Labs are reviewed and discussed with patient. JAK2 V617F mutation negative, with reflex to other mutations CALR, MPL, JAK 2 Ex 12-15 mutations negative. 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  Smoking cessation was discussed.   Refer to lung cancer screening program.    Orders Placed This Encounter  Procedures   Ambulatory Referral for Lung Cancer Screening    Referral Priority:   Routine    Referral Type:   Consultation    Referral Reason:   Specialty Services Required    Number of Visits Requested:   1    We spent sufficient time to discuss many aspect of care, questions were answered to patient's satisfaction. The patient knows to call the clinic with any problems questions or concerns.  Cc Remi Haggard, FNP  Return of visit: 6 months   Earlie Server, MD, PhD 02/13/2021

## 2021-03-28 ENCOUNTER — Other Ambulatory Visit: Payer: Self-pay | Admitting: *Deleted

## 2021-03-28 DIAGNOSIS — F17210 Nicotine dependence, cigarettes, uncomplicated: Secondary | ICD-10-CM

## 2021-03-28 DIAGNOSIS — Z87891 Personal history of nicotine dependence: Secondary | ICD-10-CM

## 2021-04-24 ENCOUNTER — Telehealth: Payer: Medicare Other | Admitting: Acute Care

## 2021-04-24 ENCOUNTER — Ambulatory Visit: Admission: RE | Admit: 2021-04-24 | Payer: Medicare Other | Source: Ambulatory Visit

## 2021-05-27 LAB — HM DIABETES EYE EXAM

## 2021-06-21 ENCOUNTER — Other Ambulatory Visit: Payer: Self-pay | Admitting: Family Medicine

## 2021-06-21 DIAGNOSIS — Z1231 Encounter for screening mammogram for malignant neoplasm of breast: Secondary | ICD-10-CM

## 2021-07-25 ENCOUNTER — Other Ambulatory Visit: Payer: Self-pay | Admitting: Family Medicine

## 2021-07-25 DIAGNOSIS — F17200 Nicotine dependence, unspecified, uncomplicated: Secondary | ICD-10-CM

## 2021-08-06 ENCOUNTER — Other Ambulatory Visit: Payer: Self-pay | Admitting: *Deleted

## 2021-08-06 DIAGNOSIS — D751 Secondary polycythemia: Secondary | ICD-10-CM

## 2021-08-13 ENCOUNTER — Inpatient Hospital Stay: Payer: Medicare Other | Admitting: Nurse Practitioner

## 2021-08-13 ENCOUNTER — Inpatient Hospital Stay: Payer: Medicare Other

## 2021-09-03 ENCOUNTER — Encounter: Payer: Self-pay | Admitting: Oncology

## 2021-09-03 ENCOUNTER — Inpatient Hospital Stay: Payer: Medicare Other | Attending: Oncology | Admitting: Nurse Practitioner

## 2021-09-03 ENCOUNTER — Inpatient Hospital Stay: Payer: Medicare Other

## 2022-01-02 DIAGNOSIS — N1831 Chronic kidney disease, stage 3a: Secondary | ICD-10-CM | POA: Insufficient documentation

## 2022-01-02 DIAGNOSIS — E039 Hypothyroidism, unspecified: Secondary | ICD-10-CM | POA: Insufficient documentation

## 2022-01-02 DIAGNOSIS — E119 Type 2 diabetes mellitus without complications: Secondary | ICD-10-CM | POA: Insufficient documentation

## 2022-01-02 NOTE — Progress Notes (Addendum)
New Patient Office Visit  Subjective    Patient ID: Helen Benson, female    DOB: [birth year]  Age: 67 y.o. MRN: [id number]  CC:  Chief Complaint  Patient presents with   New Patient (Initial Visit)    HPI Helen Benson presents to establish care. Her previous PCP was Threasa Alpha, FNP.  She has a history of hypertension, CKD 3 a, diabetes and hypothyroidism.She She smokes 1-2 cigarettes a day. Patient has double knee replacement but still has knee pain and is follwed by the pain management at [place], [place].   Outpatient Encounter Medications as of 01/03/2022  Medication Sig   ALPRAZolam (XANAX) 0.5 MG tablet Take 0.5 mg by mouth 2 (two) times daily as needed for anxiety.   atenolol (TENORMIN) 25 MG tablet Take 25 mg by mouth daily.   atorvastatin (LIPITOR) 20 MG tablet Take 1 tablet by mouth daily.   chlorthalidone (HYGROTON) 50 MG tablet Take 25 mg by mouth daily.   FARXIGA 10 MG TABS tablet Take 10 mg by mouth daily.   Investigational vitamin D 600 UNITS capsule SWOG S0812 Take 600 Units by mouth daily. Take with food.   levothyroxine (SYNTHROID, LEVOTHROID) 112 MCG tablet Take 75 mcg by mouth daily before breakfast.   losartan (COZAAR) 50 MG tablet losartan 50 mg tablet   naloxone (NARCAN) nasal spray 4 mg/0.1 mL    oxyCODONE ER (XTAMPZA ER) 18 MG C12A Xtampza ER 18 mg capsule sprinkle   Oxycodone HCl 10 MG TABS Take 10 mg by mouth 3 (three) times daily.   [DISCONTINUED] atorvastatin (LIPITOR) 20 MG tablet Take 20 mg by mouth daily.   [DISCONTINUED] enalapril (VASOTEC) 20 MG tablet Take 20 mg by mouth 2 (two) times daily.   [DISCONTINUED] hydrochlorothiazide (HYDRODIURIL) 25 MG tablet Take 25 mg by mouth daily.   [DISCONTINUED] oxyCODONE (OXYCONTIN) 10 mg 12 hr tablet Take 10 mg by mouth every 12 (twelve) hours.   [DISCONTINUED] sulfamethoxazole-trimethoprim (BACTRIM DS,SEPTRA DS) 800-160 MG tablet Take 1 tablet by mouth 2 (two) times daily. (Patient not  taking: Reported on 01/08/2021)   No facility-administered encounter medications on file as of 01/03/2022.    Past Medical History:  Diagnosis Date   Hypertension    Kidney damage     Past Surgical History:  Procedure Laterality Date   BREAST CYST ASPIRATION Bilateral    Bilat. many on both per pt   BREAST CYST EXCISION Left    age 61   JOINT REPLACEMENT      Family History  Problem Relation Age of Onset   Breast cancer Sister 38   Breast cancer Other 58       neice    Social History   Socioeconomic History   Marital status: Single    Spouse name: Not on file   Number of children: Not on file   Years of education: Not on file   Highest education level: Not on file  Occupational History   Not on file  Tobacco Use   Smoking status: Some Days    Packs/day: 0.25    Years: 40.00    Total pack years: 10.00    Types: Cigarettes   Smokeless tobacco: Never  Substance and Sexual Activity   Alcohol use: No   Drug use: Never   Sexual activity: Not Currently  Other Topics Concern   Not on file  Social History Narrative   Not on file   Social Determinants of Health   Financial  Resource Strain: Not on file  Food Insecurity: Not on file  Transportation Needs: Not on file  Physical Activity: Not on file  Stress: Not on file  Social Connections: Not on file  Intimate Partner Violence: Not on file    Review of Systems  Constitutional:  Negative for fever and weight loss.  HENT:  Negative for ear discharge and hearing loss.   Eyes: Negative.   Respiratory:  Negative for cough, shortness of breath and wheezing.   Cardiovascular:  Negative for chest pain, orthopnea and leg swelling.  Gastrointestinal: Negative.   Genitourinary: Negative.   Musculoskeletal:  Positive for joint pain.  Skin:  Negative for rash.  Neurological:  Negative for dizziness, tingling and headaches.  Psychiatric/Behavioral:  Negative for depression, substance abuse and suicidal ideas.          Objective    BP 124/71   Pulse 65   Ht 5' 3.5" (1.613 m)   Wt 178 lb 3.2 oz (80.8 kg)   BMI 31.07 kg/m   Physical Exam Constitutional:      Appearance: She is obese.  HENT:     Head: Normocephalic and atraumatic.     Right Ear: Tympanic membrane normal.     Left Ear: Tympanic membrane normal.     Nose: Nose normal.     Mouth/Throat:     Mouth: Mucous membranes are moist.     Pharynx: Oropharynx is clear.  Eyes:     Extraocular Movements: Extraocular movements intact.     Conjunctiva/sclera: Conjunctivae normal.     Pupils: Pupils are equal, round, and reactive to light.  Cardiovascular:     Rate and Rhythm: Normal rate and regular rhythm.     Pulses: Normal pulses.     Heart sounds: Normal heart sounds.  Pulmonary:     Effort: Pulmonary effort is normal.     Breath sounds: Normal breath sounds.  Abdominal:     General: Bowel sounds are normal.     Palpations: Abdomen is soft. There is no mass.     Tenderness: There is no abdominal tenderness.     Hernia: No hernia is present.  Musculoskeletal:        General: Normal range of motion.     Cervical back: Normal range of motion.  Skin:    General: Skin is warm.     Capillary Refill: Capillary refill takes less than 2 seconds.     Findings: No bruising or erythema.  Neurological:     General: No focal deficit present.     Mental Status: She is alert and oriented to person, place, and time. Mental status is at baseline.  Psychiatric:        Mood and Affect: Mood normal.        Behavior: Behavior normal.        Thought Content: Thought content normal.        Judgment: Judgment normal.         Assessment & Plan:   Problem List Items Addressed This Visit       Cardiovascular and Mediastinum   Primary hypertension - Primary    Blood pressure 124/71 in the office today. Advised patient to continue chlorthalidone, losartan and atenolol. Advised patient to eat low-salt and heart healthy diet.       Relevant Medications   atorvastatin (LIPITOR) 20 MG tablet   Other Relevant Orders   CBC with Differential/Platelet (Completed)   COMPLETE METABOLIC PANEL WITH GFR (Completed)   Lipid  panel (Completed)     Endocrine   Diabetes ([place])    Her blood sugar 131 in the office today. Advised patient to consume low-carb diet. Continue Farxiga 10 mg. Hemoglobin A1c ordered.      Relevant Medications   atorvastatin (LIPITOR) 20 MG tablet   FARXIGA 10 MG TABS tablet   Other Relevant Orders   POCT glucose (manual entry) (Completed)   Hemoglobin A1c (Completed)   Microalbumin, urine (Completed)   Hypothyroidism    Stable. Continue levothyroxine 75 mcg daily.      Relevant Orders   TSH (Completed)   T4 (Completed)   T3 (Completed)     Genitourinary   Stage 3a chronic kidney disease ([place])    Patient is followed by the nephrologist. Labs ordered.        Other   Smoker    Smoking cessation was discussed, 5-7 minutes was spent of this topic specifically.  Patient is trying to quit and is down to 1 or 2 cigarettes/day.      Obesity (BMI 30.0-34.9)    Body mass index is 31.07 kg/m. Advised pt to lose weight. Advised patient to avoid trans fat, fatty and fried food. Follow a regular physical activity schedule. Went over the risk of chronic diseases with increased weight.            Return in about 2 months (around 03/05/2022).   Theresia Lo, NP

## 2022-01-03 ENCOUNTER — Encounter: Payer: Self-pay | Admitting: Nurse Practitioner

## 2022-01-03 ENCOUNTER — Ambulatory Visit (INDEPENDENT_AMBULATORY_CARE_PROVIDER_SITE_OTHER): Payer: Medicare Other | Admitting: Nurse Practitioner

## 2022-01-03 VITALS — BP 124/71 | HR 65 | Ht 63.5 in | Wt 178.2 lb

## 2022-01-03 DIAGNOSIS — E039 Hypothyroidism, unspecified: Secondary | ICD-10-CM

## 2022-01-03 DIAGNOSIS — E66811 Obesity, class 1: Secondary | ICD-10-CM

## 2022-01-03 DIAGNOSIS — I10 Essential (primary) hypertension: Secondary | ICD-10-CM

## 2022-01-03 DIAGNOSIS — F17200 Nicotine dependence, unspecified, uncomplicated: Secondary | ICD-10-CM

## 2022-01-03 DIAGNOSIS — N1831 Chronic kidney disease, stage 3a: Secondary | ICD-10-CM | POA: Diagnosis not present

## 2022-01-03 DIAGNOSIS — E119 Type 2 diabetes mellitus without complications: Secondary | ICD-10-CM | POA: Diagnosis not present

## 2022-01-03 DIAGNOSIS — E669 Obesity, unspecified: Secondary | ICD-10-CM

## 2022-01-03 LAB — GLUCOSE, POCT (MANUAL RESULT ENTRY): POC Glucose: 131 mg/dl — AB (ref 70–99)

## 2022-01-04 LAB — CBC WITH DIFFERENTIAL/PLATELET
Absolute Monocytes: 955 cells/uL — ABNORMAL HIGH (ref 200–950)
Basophils Absolute: 74 cells/uL (ref 0–200)
Basophils Relative: 0.6 %
Eosinophils Absolute: 174 cells/uL (ref 15–500)
Eosinophils Relative: 1.4 %
HCT: 49.2 % — ABNORMAL HIGH (ref 35.0–45.0)
Hemoglobin: 17.1 g/dL — ABNORMAL HIGH (ref 11.7–15.5)
Lymphs Abs: 2319 cells/uL (ref 850–3900)
MCH: 32.1 pg (ref 27.0–33.0)
MCHC: 34.8 g/dL (ref 32.0–36.0)
MCV: 92.3 fL (ref 80.0–100.0)
MPV: 10.8 fL (ref 7.5–12.5)
Monocytes Relative: 7.7 %
Neutro Abs: 8878 cells/uL — ABNORMAL HIGH (ref 1500–7800)
Neutrophils Relative %: 71.6 %
Platelets: 297 10*3/uL (ref 140–400)
RBC: 5.33 10*6/uL — ABNORMAL HIGH (ref 3.80–5.10)
RDW: 12.3 % (ref 11.0–15.0)
Total Lymphocyte: 18.7 %
WBC: 12.4 10*3/uL — ABNORMAL HIGH (ref 3.8–10.8)

## 2022-01-04 LAB — COMPLETE METABOLIC PANEL WITH GFR
AG Ratio: 1.6 (calc) (ref 1.0–2.5)
ALT: 17 U/L (ref 6–29)
AST: 17 U/L (ref 10–35)
Albumin: 4.4 g/dL (ref 3.6–5.1)
Alkaline phosphatase (APISO): 60 U/L (ref 37–153)
BUN/Creatinine Ratio: 30 (calc) — ABNORMAL HIGH (ref 6–22)
BUN: 31 mg/dL — ABNORMAL HIGH (ref 7–25)
CO2: 24 mmol/L (ref 20–32)
Calcium: 10.1 mg/dL (ref 8.6–10.4)
Chloride: 96 mmol/L — ABNORMAL LOW (ref 98–110)
Creat: 1.04 mg/dL (ref 0.50–1.05)
Globulin: 2.7 g/dL (calc) (ref 1.9–3.7)
Glucose, Bld: 103 mg/dL — ABNORMAL HIGH (ref 65–99)
Potassium: 3.3 mmol/L — ABNORMAL LOW (ref 3.5–5.3)
Sodium: 134 mmol/L — ABNORMAL LOW (ref 135–146)
Total Bilirubin: 0.5 mg/dL (ref 0.2–1.2)
Total Protein: 7.1 g/dL (ref 6.1–8.1)
eGFR: 59 mL/min/{1.73_m2} — ABNORMAL LOW (ref >=60)

## 2022-01-04 LAB — LIPID PANEL
Cholesterol: 169 mg/dL (ref <200)
HDL: 29 mg/dL — ABNORMAL LOW (ref >=50)
LDL Cholesterol (Calc): 110 mg/dL (calc) — ABNORMAL HIGH
Non-HDL Cholesterol (Calc): 140 mg/dL (calc) — ABNORMAL HIGH (ref <130)
Total CHOL/HDL Ratio: 5.8 (calc) — ABNORMAL HIGH (ref <5.0)
Triglycerides: 181 mg/dL — ABNORMAL HIGH (ref <150)

## 2022-01-04 LAB — MICROALBUMIN, URINE: Microalb, Ur: 0.7 mg/dL

## 2022-01-04 LAB — HEMOGLOBIN A1C
Hgb A1c MFr Bld: 6.1 % of total Hgb — ABNORMAL HIGH (ref <5.7)
Mean Plasma Glucose: 128 mg/dL
eAG (mmol/L): 7.1 mmol/L

## 2022-01-04 LAB — TSH: TSH: 2.23 mIU/L (ref 0.40–4.50)

## 2022-01-04 LAB — T3: T3, Total: 126 ng/dL (ref 76–181)

## 2022-01-04 LAB — T4: T4, Total: 11 ug/dL (ref 5.1–11.9)

## 2022-01-05 ENCOUNTER — Encounter: Payer: Self-pay | Admitting: Nurse Practitioner

## 2022-01-05 DIAGNOSIS — E669 Obesity, unspecified: Secondary | ICD-10-CM | POA: Insufficient documentation

## 2022-01-05 NOTE — Assessment & Plan Note (Signed)
Her blood sugar 131 in the office today. Advised patient to consume low-carb diet. Continue Farxiga 10 mg. Hemoglobin A1c ordered.

## 2022-01-05 NOTE — Assessment & Plan Note (Signed)
Stable. Continue levothyroxine 75 mcg daily. 

## 2022-01-05 NOTE — Assessment & Plan Note (Signed)
Body mass index is 31.07 kg/m. Advised pt to lose weight. Advised patient to avoid trans fat, fatty and fried food. Follow a regular physical activity schedule. Went over the risk of chronic diseases with increased weight.

## 2022-01-05 NOTE — Assessment & Plan Note (Signed)
Patient is followed by the nephrologist. Labs ordered.

## 2022-01-05 NOTE — Assessment & Plan Note (Signed)
Smoking cessation was discussed, 5-7 minutes was spent of this topic specifically.  Patient is trying to quit and is down to 1 or 2 cigarettes/day.

## 2022-01-05 NOTE — Assessment & Plan Note (Signed)
Blood pressure 124/71 in the office today. Advised patient to continue chlorthalidone, losartan and atenolol. Advised patient to eat low-salt and heart healthy diet.

## 2022-01-10 ENCOUNTER — Ambulatory Visit (INDEPENDENT_AMBULATORY_CARE_PROVIDER_SITE_OTHER): Payer: Medicare Other | Admitting: Nurse Practitioner

## 2022-01-10 ENCOUNTER — Encounter: Payer: Self-pay | Admitting: Nurse Practitioner

## 2022-01-10 VITALS — BP 129/79 | HR 59 | Ht 63.5 in | Wt 178.2 lb

## 2022-01-10 DIAGNOSIS — Z1211 Encounter for screening for malignant neoplasm of colon: Secondary | ICD-10-CM

## 2022-01-10 DIAGNOSIS — N1831 Chronic kidney disease, stage 3a: Secondary | ICD-10-CM | POA: Diagnosis not present

## 2022-01-10 DIAGNOSIS — E785 Hyperlipidemia, unspecified: Secondary | ICD-10-CM

## 2022-01-10 DIAGNOSIS — E876 Hypokalemia: Secondary | ICD-10-CM

## 2022-01-10 DIAGNOSIS — Z716 Tobacco abuse counseling: Secondary | ICD-10-CM

## 2022-01-10 DIAGNOSIS — R799 Abnormal finding of blood chemistry, unspecified: Secondary | ICD-10-CM

## 2022-01-10 NOTE — Progress Notes (Signed)
Established Patient Office Visit  Subjective    Patient ID: RAYNE COWDREY, female    DOB: August 23, 1955  Age: 66 y.o. MRN: [id number]  CC:  Chief Complaint  Patient presents with   lab results    HPI SUMAYYAH CUSTODIO presents to establish care. Losartan 75 mg,    Outpatient Encounter Medications as of 01/10/2022  Medication Sig   atenolol (TENORMIN) 25 MG tablet Take 25 mg by mouth daily.   chlorthalidone (HYGROTON) 50 MG tablet Take 25 mg by mouth daily.   Cholecalciferol (VITAMIN D-3 PO) Take 1 capsule by mouth daily at 2 PM.   FARXIGA 10 MG TABS tablet Take 10 mg by mouth daily.   levothyroxine (SYNTHROID) 75 MCG tablet Take 75 mcg by mouth daily before breakfast.   Loratadine (ALLERGY RELIEF 24-HR PO) Take 1 tablet by mouth daily at 2 PM.   losartan (COZAAR) 50 MG tablet losartan 50 mg tablet   naloxone (NARCAN) nasal spray 4 mg/0.1 mL    oxyCODONE ER (XTAMPZA ER) 18 MG C12A Xtampza ER 18 mg capsule sprinkle   Oxycodone HCl 10 MG TABS Take 10 mg by mouth 3 (three) times daily.   rosuvastatin (CRESTOR) 20 MG tablet Take 20 mg by mouth daily.   [DISCONTINUED] ALPRAZolam (XANAX) 0.5 MG tablet Take 0.5 mg by mouth 2 (two) times daily as needed for anxiety.   ALPRAZolam (XANAX) 0.5 MG tablet Take 1 tablet (0.5 mg total) by mouth 2 (two) times daily as needed for anxiety.   [DISCONTINUED] atorvastatin (LIPITOR) 20 MG tablet Take 1 tablet by mouth daily.   [DISCONTINUED] Investigational vitamin D 600 UNITS capsule SWOG S0812 Take 600 Units by mouth daily. Take with food.   [DISCONTINUED] levothyroxine (SYNTHROID, LEVOTHROID) 112 MCG tablet Take 75 mcg by mouth daily before breakfast.   No facility-administered encounter medications on file as of 01/10/2022.    Past Medical History:  Diagnosis Date   Hypertension    Kidney damage     Past Surgical History:  Procedure Laterality Date   BREAST CYST ASPIRATION Bilateral    Bilat.  many on both per pt   BREAST CYST EXCISION Left    age 89   JOINT REPLACEMENT      Family History  Problem Relation Age of Onset   Breast cancer Sister 42   Breast cancer Other 69       neice    Social History   Socioeconomic History   Marital status: Single    Spouse name: Not on file   Number of children: Not on file   Years of education: Not on file   Highest education level: Not on file  Occupational History   Not on file  Tobacco Use   Smoking status: Some Days    Packs/day: 0.25    Years: 40.00    Total pack years: 10.00    Types: Cigarettes   Smokeless tobacco: Never  Substance and Sexual Activity   Alcohol use: No   Drug use: Never   Sexual activity: Not Currently  Other Topics Concern   Not on file  Social History Narrative   Not on file   Social Determinants of Health   Financial  Resource Strain: Not on file  Food Insecurity: Not on file  Transportation Needs: Not on file  Physical Activity: Not on file  Stress: Not on file  Social Connections: Not on file  Intimate Partner Violence: Not on file    Review of Systems  Constitutional:  Negative for fever and weight loss.  HENT:  Negative for ear discharge and hearing loss.   Eyes: Negative.   Respiratory:  Negative for cough, shortness of breath and wheezing.   Cardiovascular:  Negative for chest pain, orthopnea and leg swelling.  Gastrointestinal: Negative.   Genitourinary: Negative.   Musculoskeletal:  Positive for joint pain.  Skin:  Negative for rash.  Neurological:  Negative for dizziness, tingling and headaches.  Psychiatric/Behavioral:  Negative for depression, substance abuse and suicidal ideas.         Objective    BP 129/79   Pulse (!) 59   Ht 5' 3.5" (1.613 m)   Wt 178 lb 3.2 oz (80.8 kg)   BMI 31.07 kg/m   Physical Exam Constitutional:      Appearance: She is obese.  HENT:     Head: Normocephalic and atraumatic.     Right Ear: Tympanic membrane normal.     Left Ear:  Tympanic membrane normal.     Nose: Nose normal.     Mouth/Throat:     Mouth: Mucous membranes are moist.     Pharynx: Oropharynx is clear.  Eyes:     Extraocular Movements: Extraocular movements intact.     Conjunctiva/sclera: Conjunctivae normal.     Pupils: Pupils are equal, round, and reactive to light.  Cardiovascular:     Rate and Rhythm: Normal rate and regular rhythm.     Pulses: Normal pulses.     Heart sounds: Normal heart sounds.  Pulmonary:     Effort: Pulmonary effort is normal.     Breath sounds: Normal breath sounds.  Abdominal:     General: Bowel sounds are normal.     Palpations: Abdomen is soft. There is no mass.     Tenderness: There is no abdominal tenderness.     Hernia: No hernia is present.  Musculoskeletal:        General: Normal range of motion.     Cervical back: Normal range of motion.  Skin:    General: Skin is warm.     Capillary Refill: Capillary refill takes less than 2 seconds.     Findings: No bruising or erythema.  Neurological:     General: No focal deficit present.     Mental Status: She is alert and oriented to person, place, and time. Mental status is at baseline.  Psychiatric:        Mood and Affect: Mood normal.        Behavior: Behavior normal.        Thought Content: Thought content normal.        Judgment: Judgment normal.         Assessment & Plan:   Problem List Items Addressed This Visit       Genitourinary   Stage 3a chronic kidney disease ([place]) - Primary    Her BUN 31 and EGFR 59 on 01/03/2022. Patient is on statin therapy for cardiovascular risk reduction. Patient is followed by nephrologist.        Other   Low serum potassium level    Potassium 3.3 on 01/03/2022. Advised patient to eat potassium rich diet including  dry fruits, spinach, broccoli, beans, potatoes,  cantaloupe, avocado, coconut water and salmon.       Dyslipidemia    Patient has elevated LDL, triglycerides and lower level of HDL. Advised  patient to consume diet high in fruits, veggies, whole grains, low-fat diary, nuts and lower intake of red meat trans fat sweets and sugary beverages. Follow regular exercise routine.      Relevant Medications   rosuvastatin (CRESTOR) 20 MG tablet   Abnormal blood cell count    Abnormal blood count.  Will repeat CBC 6 to 8 weeks.      Other Visit Diagnoses     Screening for colon cancer       Relevant Orders   Ambulatory referral to Gastroenterology      No follow-ups on file.   Theresia Lo, NP

## 2022-01-11 MED ORDER — ALPRAZOLAM 0.5 MG PO TABS
0.5000 mg | ORAL_TABLET | Freq: Two times a day (BID) | ORAL | 0 refills | Status: DC | PRN
Start: 2022-01-11 — End: 2022-02-07

## 2022-01-17 DIAGNOSIS — R799 Abnormal finding of blood chemistry, unspecified: Secondary | ICD-10-CM | POA: Insufficient documentation

## 2022-01-17 DIAGNOSIS — E876 Hypokalemia: Secondary | ICD-10-CM | POA: Insufficient documentation

## 2022-01-17 DIAGNOSIS — E785 Hyperlipidemia, unspecified: Secondary | ICD-10-CM | POA: Insufficient documentation

## 2022-01-17 NOTE — Assessment & Plan Note (Signed)
Potassium 3.3 on 01/03/2022. Advised patient to eat potassium rich diet including  dry fruits, spinach, broccoli, beans, potatoes, cantaloupe, avocado, coconut water and salmon.

## 2022-01-17 NOTE — Assessment & Plan Note (Addendum)
Her BUN 31 and EGFR 59 on 01/03/2022. Patient is on statin therapy for cardiovascular risk reduction. Patient is followed by nephrologist.

## 2022-01-17 NOTE — Progress Notes (Signed)
Established Patient Office Visit  Subjective:  Patient ID: Helen Benson, female    DOB: 11/07/1955  Age: 66 y.o. MRN: [id number]  CC:  Chief Complaint  Patient presents with   lab results     HPI  Helen Benson presents for lab review.  HPI   Past Medical History:  Diagnosis Date   Hypertension    Kidney damage     Past Surgical History:  Procedure Laterality Date   BREAST CYST ASPIRATION Bilateral    Bilat. many on both per pt   BREAST CYST EXCISION Left    age 21   JOINT REPLACEMENT      Family History  Problem Relation Age of Onset   Breast cancer Sister 66   Breast cancer Other 60       neice    Social History   Socioeconomic History   Marital status: Single    Spouse name: Not on file   Number of children: Not on file   Years of education: Not on file   Highest education level: Not on file  Occupational History   Not on file  Tobacco Use   Smoking status: Some Days    Packs/day: 0.25    Years: 40.00    Total pack years: 10.00    Types: Cigarettes   Smokeless tobacco: Never  Substance and Sexual Activity   Alcohol use: No   Drug use: Never   Sexual activity: Not Currently  Other Topics Concern   Not on file  Social History Narrative   Not on file   Social Determinants of Health   Financial Resource Strain: Not on file  Food Insecurity: Not on file  Transportation Needs: Not on file  Physical Activity: Not on file  Stress: Not on file  Social Connections: Not on file  Intimate Partner Violence: Not on file     Outpatient Medications Prior to Visit  Medication Sig Dispense Refill   atenolol (TENORMIN) 25 MG tablet Take 25 mg by mouth daily.     chlorthalidone (HYGROTON) 50 MG tablet Take 25 mg by mouth daily.     Cholecalciferol (VITAMIN D-3 PO) Take 1 capsule by mouth daily at 2 PM.     FARXIGA 10 MG TABS tablet Take 10 mg by mouth daily.     levothyroxine (SYNTHROID) 75 MCG tablet Take 75 mcg by mouth daily before  breakfast.     Loratadine (ALLERGY RELIEF 24-HR PO) Take 1 tablet by mouth daily at 2 PM.     losartan (COZAAR) 50 MG tablet losartan 50 mg tablet     naloxone (NARCAN) nasal spray 4 mg/0.1 mL      oxyCODONE ER (XTAMPZA ER) 18 MG C12A Xtampza ER 18 mg capsule sprinkle     Oxycodone HCl 10 MG TABS Take 10 mg by mouth 3 (three) times daily.     rosuvastatin (CRESTOR) 20 MG tablet Take 20 mg by mouth daily.     ALPRAZolam (XANAX) 0.5 MG tablet Take 0.5 mg by mouth 2 (two) times daily as needed for anxiety.     atorvastatin (LIPITOR) 20 MG tablet Take 1 tablet by mouth daily.     Investigational vitamin D 600 UNITS capsule SWOG S0812 Take 600 Units by mouth daily. Take with food.     levothyroxine (SYNTHROID, LEVOTHROID) 112 MCG tablet Take 75 mcg by mouth daily before breakfast.     No facility-administered medications prior to visit.    No Known Allergies  ROS  Review of Systems  Constitutional: Negative.   HENT: Negative.    Eyes: Negative.   Respiratory: Negative.    Cardiovascular: Negative.   Gastrointestinal: Negative.   Musculoskeletal:  Positive for arthralgias.  Skin: Negative.   Neurological:  Negative for dizziness, facial asymmetry and headaches.  Hematological: Negative.   Psychiatric/Behavioral:  Negative for agitation, behavioral problems and confusion.       Objective:    Physical Exam Constitutional:      Appearance: Normal appearance. She is obese.  HENT:     Head: Normocephalic.     Right Ear: Tympanic membrane normal.     Left Ear: Tympanic membrane normal.     Nose: Nose normal.     Mouth/Throat:     Mouth: Mucous membranes are moist.     Pharynx: Oropharynx is clear.  Eyes:     Extraocular Movements: Extraocular movements intact.     Conjunctiva/sclera: Conjunctivae normal.     Pupils: Pupils are equal, round, and reactive to light.  Cardiovascular:     Rate and Rhythm: Normal rate and regular rhythm.     Pulses: Normal pulses.     Heart sounds:  Normal heart sounds.  Pulmonary:     Effort: Pulmonary effort is normal. No respiratory distress.     Breath sounds: Normal breath sounds. No rhonchi.  Abdominal:     General: Bowel sounds are normal.     Palpations: Abdomen is soft. There is no mass.     Tenderness: There is no abdominal tenderness.     Hernia: No hernia is present.  Musculoskeletal:        General: Normal range of motion.     Cervical back: Neck supple. No tenderness.  Skin:    General: Skin is warm.     Capillary Refill: Capillary refill takes less than 2 seconds.  Neurological:     General: No focal deficit present.     Mental Status: She is alert and oriented to person, place, and time. Mental status is at baseline.  Psychiatric:        Mood and Affect: Mood normal.        Behavior: Behavior normal.        Thought Content: Thought content normal.        Judgment: Judgment normal.     BP 129/79   Pulse (!) 59   Ht 5' 3.5" (1.613 m)   Wt 178 lb 3.2 oz (80.8 kg)   BMI 31.07 kg/m  Wt Readings from Last 3 Encounters:  01/10/22 178 lb 3.2 oz (80.8 kg)  01/03/22 178 lb 3.2 oz (80.8 kg)  02/13/21 221 lb 8 oz (100.5 kg)     Health Maintenance  Topic Date Due   COVID-19 Vaccine (1) Never done   FOOT EXAM  Never done   COLONOSCOPY (Pts 45-20yr Insurance coverage will need to be confirmed)  Never done   Zoster Vaccines- Shingrix (1 of 2) Never done   TETANUS/TDAP  11/25/2020   MAMMOGRAM  04/28/2021   INFLUENZA VACCINE  12/25/2021   Pneumonia Vaccine 66 Years old (1 - PCV) 01/04/2023 (Originally 08/19/1961)   OPHTHALMOLOGY EXAM  05/27/2022   HEMOGLOBIN A1C  07/06/2022   DEXA SCAN  Completed   Hepatitis C Screening  Completed   HPV VACCINES  Aged Out    There are no preventive care reminders to display for this patient.  Lab Results  Component Value Date   TSH 2.23 01/03/2022   Lab Results  Component  Value Date   WBC 12.4 (H) 01/03/2022   HGB 17.1 (H) 01/03/2022   HCT 49.2 (H) 01/03/2022    MCV 92.3 01/03/2022   PLT 297 01/03/2022   Lab Results  Component Value Date   NA 134 (L) 01/03/2022   K 3.3 (L) 01/03/2022   CO2 24 01/03/2022   GLUCOSE 103 (H) 01/03/2022   BUN 31 (H) 01/03/2022   CREATININE 1.04 01/03/2022   BILITOT 0.5 01/03/2022   ALKPHOS 68 01/08/2021   AST 17 01/03/2022   ALT 17 01/03/2022   PROT 7.1 01/03/2022   ALBUMIN 4.0 01/08/2021   CALCIUM 10.1 01/03/2022   ANIONGAP 11 01/08/2021   EGFR 59 (L) 01/03/2022   Lab Results  Component Value Date   CHOL 169 01/03/2022   Lab Results  Component Value Date   HDL 29 (L) 01/03/2022   Lab Results  Component Value Date   LDLCALC 110 (H) 01/03/2022   Lab Results  Component Value Date   TRIG 181 (H) 01/03/2022   Lab Results  Component Value Date   CHOLHDL 5.8 (H) 01/03/2022   Lab Results  Component Value Date   HGBA1C 6.1 (H) 01/03/2022      Assessment & Plan:   Problem List Items Addressed This Visit       Genitourinary   Stage 3a chronic kidney disease ([place]) - Primary    Her BUN 31 and EGFR 59 on 01/03/2022. Patient is on statin therapy for cardiovascular risk reduction. Patient is followed by nephrologist.        Other   Low serum potassium level    Potassium 3.3 on 01/03/2022. Advised patient to eat potassium rich diet including  dry fruits, spinach, broccoli, beans, potatoes, cantaloupe, avocado, coconut water and salmon.       Dyslipidemia    Patient has elevated LDL, triglycerides and lower level of HDL. Advised patient to consume diet high in fruits, veggies, whole grains, low-fat diary, nuts and lower intake of red meat trans fat sweets and sugary beverages. Follow regular exercise routine.      Relevant Medications   rosuvastatin (CRESTOR) 20 MG tablet   Abnormal blood cell count    Abnormal blood count.  Will repeat CBC 6 to 8 weeks.      Other Visit Diagnoses     Screening for colon cancer       Relevant Orders   Ambulatory referral to Gastroenterology         Meds ordered this encounter  Medications   ALPRAZolam (XANAX) 0.5 MG tablet    Sig: Take 1 tablet (0.5 mg total) by mouth 2 (two) times daily as needed for anxiety.    Dispense:  60 tablet    Refill:  0     Follow-up: No follow-ups on file.    Theresia Lo, NP

## 2022-01-23 NOTE — Assessment & Plan Note (Signed)
Abnormal blood count.  Will repeat CBC 6 to 8 weeks.

## 2022-01-23 NOTE — Assessment & Plan Note (Signed)
Patient has elevated LDL, triglycerides and lower level of HDL. Advised patient to consume diet high in fruits, veggies, whole grains, low-fat diary, nuts and lower intake of red meat trans fat sweets and sugary beverages. Follow regular exercise routine.

## 2022-02-04 ENCOUNTER — Other Ambulatory Visit: Payer: Self-pay | Admitting: *Deleted

## 2022-02-04 MED ORDER — ATENOLOL 25 MG PO TABS: 25.0000 mg | ORAL_TABLET | Freq: Every day | ORAL | 1 refills | Status: DC

## 2022-02-04 MED ORDER — LOSARTAN POTASSIUM 50 MG PO TABS: ORAL_TABLET | ORAL | 1 refills | Status: DC

## 2022-02-04 MED ORDER — LEVOTHYROXINE SODIUM 75 MCG PO TABS: 75.0000 ug | ORAL_TABLET | Freq: Every day | ORAL | 1 refills | Status: DC

## 2022-02-07 ENCOUNTER — Other Ambulatory Visit: Payer: Self-pay | Admitting: *Deleted

## 2022-02-11 ENCOUNTER — Other Ambulatory Visit: Payer: Self-pay | Admitting: Nurse Practitioner

## 2022-02-12 MED ORDER — ALPRAZOLAM 0.5 MG PO TABS: 0.5000 mg | ORAL_TABLET | Freq: Two times a day (BID) | ORAL | 0 refills | Status: DC | PRN

## 2022-02-18 ENCOUNTER — Ambulatory Visit: Payer: Medicare Other | Admitting: Internal Medicine

## 2022-02-18 NOTE — Progress Notes (Deleted)
Yeah!!!!

## 2022-02-26 ENCOUNTER — Emergency Department: Payer: Medicare Other

## 2022-02-26 ENCOUNTER — Encounter: Payer: Self-pay | Admitting: Internal Medicine

## 2022-02-26 ENCOUNTER — Inpatient Hospital Stay
Admission: EM | Admit: 2022-02-26 | Discharge: 2022-03-05 | DRG: 189 | Disposition: A | Discharge status: Home Health | Payer: Medicare Other | Attending: Internal Medicine | Admitting: Internal Medicine

## 2022-02-26 ENCOUNTER — Other Ambulatory Visit: Payer: Self-pay

## 2022-02-26 DIAGNOSIS — Z716 Tobacco abuse counseling: Secondary | ICD-10-CM | POA: Diagnosis not present

## 2022-02-26 DIAGNOSIS — E039 Hypothyroidism, unspecified: Secondary | ICD-10-CM | POA: Diagnosis present

## 2022-02-26 DIAGNOSIS — I2510 Atherosclerotic heart disease of native coronary artery without angina pectoris: Secondary | ICD-10-CM | POA: Diagnosis present

## 2022-02-26 DIAGNOSIS — Z79890 Hormone replacement therapy (postmenopausal): Secondary | ICD-10-CM

## 2022-02-26 DIAGNOSIS — F419 Anxiety disorder, unspecified: Secondary | ICD-10-CM | POA: Diagnosis present

## 2022-02-26 DIAGNOSIS — J206 Acute bronchitis due to rhinovirus: Secondary | ICD-10-CM | POA: Diagnosis present

## 2022-02-26 DIAGNOSIS — I129 Hypertensive chronic kidney disease with stage 1 through stage 4 chronic kidney disease, or unspecified chronic kidney disease: Secondary | ICD-10-CM | POA: Diagnosis present

## 2022-02-26 DIAGNOSIS — J9601 Acute respiratory failure with hypoxia: Principal | ICD-10-CM

## 2022-02-26 DIAGNOSIS — E139 Other specified diabetes mellitus without complications: Secondary | ICD-10-CM | POA: Diagnosis not present

## 2022-02-26 DIAGNOSIS — Z6828 Body mass index (BMI) 28.0-28.9, adult: Secondary | ICD-10-CM

## 2022-02-26 DIAGNOSIS — E785 Hyperlipidemia, unspecified: Secondary | ICD-10-CM | POA: Diagnosis present

## 2022-02-26 DIAGNOSIS — Z635 Disruption of family by separation and divorce: Secondary | ICD-10-CM

## 2022-02-26 DIAGNOSIS — Z7984 Long term (current) use of oral hypoglycemic drugs: Secondary | ICD-10-CM

## 2022-02-26 DIAGNOSIS — F17200 Nicotine dependence, unspecified, uncomplicated: Secondary | ICD-10-CM | POA: Diagnosis not present

## 2022-02-26 DIAGNOSIS — I2489 Other forms of acute ischemic heart disease: Secondary | ICD-10-CM | POA: Diagnosis present

## 2022-02-26 DIAGNOSIS — E1365 Other specified diabetes mellitus with hyperglycemia: Secondary | ICD-10-CM | POA: Diagnosis present

## 2022-02-26 DIAGNOSIS — R778 Other specified abnormalities of plasma proteins: Secondary | ICD-10-CM | POA: Diagnosis not present

## 2022-02-26 DIAGNOSIS — R7989 Other specified abnormal findings of blood chemistry: Secondary | ICD-10-CM

## 2022-02-26 DIAGNOSIS — E1169 Type 2 diabetes mellitus with other specified complication: Secondary | ICD-10-CM

## 2022-02-26 DIAGNOSIS — Z79899 Other long term (current) drug therapy: Secondary | ICD-10-CM

## 2022-02-26 DIAGNOSIS — N1831 Chronic kidney disease, stage 3a: Secondary | ICD-10-CM | POA: Diagnosis present

## 2022-02-26 DIAGNOSIS — E1322 Other specified diabetes mellitus with diabetic chronic kidney disease: Secondary | ICD-10-CM | POA: Diagnosis present

## 2022-02-26 DIAGNOSIS — E876 Hypokalemia: Secondary | ICD-10-CM | POA: Diagnosis present

## 2022-02-26 DIAGNOSIS — E669 Obesity, unspecified: Secondary | ICD-10-CM | POA: Diagnosis present

## 2022-02-26 DIAGNOSIS — Z1152 Encounter for screening for COVID-19: Secondary | ICD-10-CM | POA: Diagnosis not present

## 2022-02-26 DIAGNOSIS — J208 Acute bronchitis due to other specified organisms: Secondary | ICD-10-CM | POA: Diagnosis present

## 2022-02-26 DIAGNOSIS — B348 Other viral infections of unspecified site: Secondary | ICD-10-CM | POA: Diagnosis not present

## 2022-02-26 DIAGNOSIS — F17210 Nicotine dependence, cigarettes, uncomplicated: Secondary | ICD-10-CM | POA: Diagnosis present

## 2022-02-26 DIAGNOSIS — I10 Essential (primary) hypertension: Secondary | ICD-10-CM | POA: Diagnosis present

## 2022-02-26 DIAGNOSIS — D751 Secondary polycythemia: Secondary | ICD-10-CM | POA: Diagnosis not present

## 2022-02-26 HISTORY — DX: Hyperlipidemia, unspecified: E78.5

## 2022-02-26 HISTORY — DX: Secondary polycythemia: D75.1

## 2022-02-26 HISTORY — DX: Type 2 diabetes mellitus without complications: E11.9

## 2022-02-26 LAB — RESP PANEL BY RT-PCR (FLU A&B, COVID) ARPGX2
Influenza A by PCR: NEGATIVE
Influenza B by PCR: NEGATIVE
SARS Coronavirus 2 by RT PCR: NEGATIVE

## 2022-02-26 LAB — COMPREHENSIVE METABOLIC PANEL
ALT: 19 U/L (ref 0–44)
AST: 30 U/L (ref 15–41)
Albumin: 4.2 g/dL (ref 3.5–5.0)
Alkaline Phosphatase: 57 U/L (ref 38–126)
Anion gap: 12 (ref 5–15)
BUN: 33 mg/dL — ABNORMAL HIGH (ref 8–23)
CO2: 28 mmol/L (ref 22–32)
Calcium: 9.7 mg/dL (ref 8.9–10.3)
Chloride: 96 mmol/L — ABNORMAL LOW (ref 98–111)
Creatinine, Ser: 1.4 mg/dL — ABNORMAL HIGH (ref 0.44–1.00)
GFR, Estimated: 41 mL/min — ABNORMAL LOW (ref >60)
Glucose, Bld: 140 mg/dL — ABNORMAL HIGH (ref 70–99)
Potassium: 3.5 mmol/L (ref 3.5–5.1)
Sodium: 136 mmol/L (ref 135–145)
Total Bilirubin: 1.3 mg/dL — ABNORMAL HIGH (ref 0.3–1.2)
Total Protein: 7.8 g/dL (ref 6.5–8.1)

## 2022-02-26 LAB — CBC WITH DIFFERENTIAL/PLATELET
Abs Immature Granulocytes: 0.06 10*3/uL (ref 0.00–0.07)
Basophils Absolute: 0.1 10*3/uL (ref 0.0–0.1)
Basophils Relative: 1 %
Eosinophils Absolute: 0.2 10*3/uL (ref 0.0–0.5)
Eosinophils Relative: 1 %
HCT: 47.8 % — ABNORMAL HIGH (ref 36.0–46.0)
Hemoglobin: 15.4 g/dL — ABNORMAL HIGH (ref 12.0–15.0)
Immature Granulocytes: 0 %
Lymphocytes Relative: 15 %
Lymphs Abs: 2.2 10*3/uL (ref 0.7–4.0)
MCH: 30.4 pg (ref 26.0–34.0)
MCHC: 32.2 g/dL (ref 30.0–36.0)
MCV: 94.5 fL (ref 80.0–100.0)
Monocytes Absolute: 1.7 10*3/uL — ABNORMAL HIGH (ref 0.1–1.0)
Monocytes Relative: 11 %
Neutro Abs: 10.5 10*3/uL — ABNORMAL HIGH (ref 1.7–7.7)
Neutrophils Relative %: 72 %
Platelets: 246 10*3/uL (ref 150–400)
RBC: 5.06 MIL/uL (ref 3.87–5.11)
RDW: 13.1 % (ref 11.5–15.5)
WBC: 14.7 10*3/uL — ABNORMAL HIGH (ref 4.0–10.5)
nRBC: 0 % (ref 0.0–0.2)

## 2022-02-26 LAB — TROPONIN I (HIGH SENSITIVITY)
Troponin I (High Sensitivity): 68 ng/L — ABNORMAL HIGH (ref <18)
Troponin I (High Sensitivity): 114 ng/L (ref <18)
Troponin I (High Sensitivity): 59 ng/L — ABNORMAL HIGH (ref <18)

## 2022-02-26 LAB — APTT: aPTT: 35 seconds (ref 24–36)

## 2022-02-26 LAB — TSH: TSH: 1.115 u[IU]/mL (ref 0.350–4.500)

## 2022-02-26 LAB — PROTIME-INR
INR: 1.1 (ref 0.8–1.2)
Prothrombin Time: 14.2 seconds (ref 11.4–15.2)

## 2022-02-26 LAB — LACTIC ACID, PLASMA
Lactic Acid, Venous: 2 mmol/L (ref 0.5–1.9)
Lactic Acid, Venous: 2.8 mmol/L (ref 0.5–1.9)
Lactic Acid, Venous: 2 mmol/L (ref 0.5–1.9)

## 2022-02-26 LAB — GLUCOSE, CAPILLARY: Glucose-Capillary: 228 mg/dL — ABNORMAL HIGH (ref 70–99)

## 2022-02-26 LAB — CBG MONITORING, ED: Glucose-Capillary: 188 mg/dL — ABNORMAL HIGH (ref 70–99)

## 2022-02-26 LAB — BRAIN NATRIURETIC PEPTIDE: B Natriuretic Peptide: 255.2 pg/mL — ABNORMAL HIGH (ref 0.0–100.0)

## 2022-02-26 MED ORDER — CHLORTHALIDONE 25 MG PO TABS
25.0000 mg | ORAL_TABLET | Freq: Every day | ORAL | Status: DC
  Administered 2022-02-27 – 2022-03-02 (×4): 25 mg via ORAL
  Filled 2022-02-26 (×4): qty 1

## 2022-02-26 MED ORDER — SODIUM CHLORIDE 0.9 % IV SOLN
1.0000 g | Freq: Once | INTRAVENOUS | Status: AC
  Administered 2022-02-26: 1 g via INTRAVENOUS
  Filled 2022-02-26: qty 10

## 2022-02-26 MED ORDER — ALPRAZOLAM 0.5 MG PO TABS
0.5000 mg | ORAL_TABLET | Freq: Two times a day (BID) | ORAL | Status: DC | PRN
  Administered 2022-02-26 – 2022-03-04 (×8): 0.5 mg via ORAL
  Filled 2022-02-26 (×8): qty 1

## 2022-02-26 MED ORDER — ROSUVASTATIN CALCIUM 10 MG PO TABS
20.0000 mg | ORAL_TABLET | Freq: Every day | ORAL | Status: DC
  Administered 2022-02-26 – 2022-03-04 (×7): 20 mg via ORAL
  Filled 2022-02-26 (×2): qty 2
  Filled 2022-02-26: qty 1
  Filled 2022-02-26 (×2): qty 2
  Filled 2022-02-26: qty 1
  Filled 2022-02-26: qty 2

## 2022-02-26 MED ORDER — ONDANSETRON HCL 4 MG/2ML IJ SOLN: 4.0000 mg | Freq: Four times a day (QID) | INTRAMUSCULAR | Status: AC | PRN

## 2022-02-26 MED ORDER — IPRATROPIUM-ALBUTEROL 0.5-2.5 (3) MG/3ML IN SOLN
3.0000 mL | Freq: Three times a day (TID) | RESPIRATORY_TRACT | Status: AC
  Administered 2022-02-26 – 2022-02-27 (×3): 3 mL via RESPIRATORY_TRACT
  Filled 2022-02-26 (×3): qty 3

## 2022-02-26 MED ORDER — LEVOTHYROXINE SODIUM 50 MCG PO TABS
75.0000 ug | ORAL_TABLET | Freq: Every day | ORAL | Status: DC
  Administered 2022-02-27 – 2022-03-05 (×7): 75 ug via ORAL
  Filled 2022-02-26 (×3): qty 1
  Filled 2022-02-26: qty 2
  Filled 2022-02-26 (×3): qty 1

## 2022-02-26 MED ORDER — SODIUM CHLORIDE 0.9 % IV SOLN
500.0000 mg | INTRAVENOUS | Status: DC
  Administered 2022-02-26 – 2022-03-02 (×5): 500 mg via INTRAVENOUS
  Filled 2022-02-26: qty 5
  Filled 2022-02-26: qty 500
  Filled 2022-02-26 (×5): qty 5

## 2022-02-26 MED ORDER — ASPIRIN 81 MG PO CHEW
324.0000 mg | CHEWABLE_TABLET | Freq: Once | ORAL | Status: AC
  Administered 2022-02-26: 324 mg via ORAL
  Filled 2022-02-26: qty 4

## 2022-02-26 MED ORDER — METHYLPREDNISOLONE SODIUM SUCC 125 MG IJ SOLR
125.0000 mg | Freq: Once | INTRAMUSCULAR | Status: AC
  Administered 2022-02-26: 125 mg via INTRAVENOUS
  Filled 2022-02-26: qty 2

## 2022-02-26 MED ORDER — METHYLPREDNISOLONE SODIUM SUCC 40 MG IJ SOLR
40.0000 mg | Freq: Two times a day (BID) | INTRAMUSCULAR | Status: AC
  Administered 2022-02-26 – 2022-02-27 (×2): 40 mg via INTRAVENOUS
  Filled 2022-02-26 (×2): qty 1

## 2022-02-26 MED ORDER — ATENOLOL 25 MG PO TABS
25.0000 mg | ORAL_TABLET | Freq: Every day | ORAL | Status: DC
  Administered 2022-02-27 – 2022-03-01 (×3): 25 mg via ORAL
  Filled 2022-02-26 (×3): qty 1

## 2022-02-26 MED ORDER — OXYCODONE HCL 5 MG PO TABS
10.0000 mg | ORAL_TABLET | Freq: Three times a day (TID) | ORAL | Status: DC
  Administered 2022-02-27 – 2022-03-05 (×19): 10 mg via ORAL
  Filled 2022-02-26 (×19): qty 2

## 2022-02-26 MED ORDER — IOHEXOL 350 MG/ML SOLN
60.0000 mL | Freq: Once | INTRAVENOUS | Status: AC | PRN
  Administered 2022-02-26: 60 mL via INTRAVENOUS

## 2022-02-26 MED ORDER — HEPARIN (PORCINE) 25000 UT/250ML-% IV SOLN
1000.0000 [IU]/h | INTRAVENOUS | Status: AC
  Administered 2022-02-26: 800 [IU]/h via INTRAVENOUS
  Administered 2022-02-27: 1000 [IU]/h via INTRAVENOUS
  Filled 2022-02-26 (×3): qty 250

## 2022-02-26 MED ORDER — ACETAMINOPHEN 325 MG PO TABS
650.0000 mg | ORAL_TABLET | Freq: Four times a day (QID) | ORAL | Status: AC | PRN
  Administered 2022-02-27: 650 mg via ORAL
  Filled 2022-02-26 (×2): qty 2

## 2022-02-26 MED ORDER — LORAZEPAM 2 MG/ML IJ SOLN: 0.5000 mg | Freq: Four times a day (QID) | INTRAMUSCULAR | Status: DC | PRN

## 2022-02-26 MED ORDER — SENNOSIDES-DOCUSATE SODIUM 8.6-50 MG PO TABS: 1.0000 | ORAL_TABLET | Freq: Every evening | ORAL | Status: DC | PRN

## 2022-02-26 MED ORDER — IPRATROPIUM-ALBUTEROL 0.5-2.5 (3) MG/3ML IN SOLN
3.0000 mL | Freq: Once | RESPIRATORY_TRACT | Status: AC
  Administered 2022-02-26: 3 mL via RESPIRATORY_TRACT
  Filled 2022-02-26 (×2): qty 9

## 2022-02-26 MED ORDER — HEPARIN BOLUS VIA INFUSION
4000.0000 [IU] | Freq: Once | INTRAVENOUS | Status: AC
  Administered 2022-02-26: 4000 [IU] via INTRAVENOUS
  Filled 2022-02-26: qty 4000

## 2022-02-26 MED ORDER — INSULIN ASPART 100 UNIT/ML IJ SOLN: 0.0000 [IU] | Freq: Every day | INTRAMUSCULAR | Status: DC

## 2022-02-26 MED ORDER — INSULIN ASPART 100 UNIT/ML IJ SOLN
0.0000 [IU] | Freq: Three times a day (TID) | INTRAMUSCULAR | Status: DC
  Administered 2022-02-27: 2 [IU] via SUBCUTANEOUS
  Administered 2022-02-27 – 2022-02-28 (×3): 3 [IU] via SUBCUTANEOUS
  Administered 2022-02-28: 2 [IU] via SUBCUTANEOUS
  Administered 2022-02-28: 5 [IU] via SUBCUTANEOUS
  Administered 2022-03-01: 2 [IU] via SUBCUTANEOUS
  Administered 2022-03-01: 3 [IU] via SUBCUTANEOUS
  Administered 2022-03-01: 2 [IU] via SUBCUTANEOUS
  Administered 2022-03-02 – 2022-03-04 (×6): 3 [IU] via SUBCUTANEOUS
  Administered 2022-03-04: 2 [IU] via SUBCUTANEOUS
  Administered 2022-03-04: 3 [IU] via SUBCUTANEOUS
  Filled 2022-02-26 (×18): qty 1

## 2022-02-26 MED ORDER — LOSARTAN POTASSIUM 50 MG PO TABS
50.0000 mg | ORAL_TABLET | Freq: Every day | ORAL | Status: DC
  Administered 2022-02-27 – 2022-02-28 (×2): 50 mg via ORAL
  Filled 2022-02-26 (×2): qty 1

## 2022-02-26 MED ORDER — NICOTINE 21 MG/24HR TD PT24: 21.0000 mg | MEDICATED_PATCH | Freq: Every day | TRANSDERMAL | Status: DC | PRN

## 2022-02-26 MED ORDER — ACETAMINOPHEN 650 MG RE SUPP: 650.0000 mg | Freq: Four times a day (QID) | RECTAL | Status: AC | PRN

## 2022-02-26 MED ORDER — ONDANSETRON HCL 4 MG PO TABS: 4.0000 mg | ORAL_TABLET | Freq: Four times a day (QID) | ORAL | Status: AC | PRN

## 2022-02-26 MED ORDER — DAPAGLIFLOZIN PROPANEDIOL 10 MG PO TABS
10.0000 mg | ORAL_TABLET | Freq: Every day | ORAL | Status: DC
  Administered 2022-02-28 – 2022-03-05 (×6): 10 mg via ORAL
  Filled 2022-02-26 (×6): qty 1

## 2022-02-26 NOTE — Hospital Course (Addendum)
[name] is a 66 year old female with history of hypertension, hyperlipidemia, erythrocytosis, anxiety, hypothyroid, CKD stage IIIa, obesity, tobacco use, non-insulin-dependent diabetes mellitus, who presents to the emergency department for chief concerns of shortness of breath.  Of note, patient was reported to have SPO2 of 62% on room air.  Initial vitals in the emergency department showed temperature of 98.9, respiration rate 21, heart rate of 82, blood pressure 123/70, SPO2 of 100% on 15 L nonrebreather.  Serum sodium is 136, potassium 2.5, chloride 96, bicarb 28, BUN of 33, serum creatinine of 1.40, GFR 41, nonfasting blood glucose 140, WBC 14.7, hemoglobin 15.4, platelets of 246.  Lactic acid was 2.0 and increased to 2.8.  High sensitive troponin was 68 and increased to 114.  BNP was elevated at 255.2.  COVID/influenza A/influenza B PCR were negative.  ED treatment: Aspirin 324 mg p.o. one-time dose, heparin bolus and gtt., Solu-Medrol 125 mg IV, DuoNebs one-time dose, ceftriaxone 1 g IV.

## 2022-02-26 NOTE — ED Notes (Signed)
Dessie RN aware of assigned bed

## 2022-02-26 NOTE — Assessment & Plan Note (Addendum)
-   Per documentation on 01/08/2021, patient was prescribed paroxetine 10 mg daily as patient was recently separated from her husband and now living with her sister and daughter-in-law - Resumed home alprazolam 0.5 mg p.o. twice daily as needed for anxiety

## 2022-02-26 NOTE — Consult Note (Signed)
[place] for IV Heparin Indication: chest pain/ACS  No Known Allergies  Patient Measurements: Height: 5\' 3"  (160 cm) Weight: 72.6 kg (160 lb) IBW/kg (Calculated) : 52.4 Heparin Dosing Weight: 67.6 kg  Vital Signs: Temp: 98.9 F (37.2 C) (10/03 1221) BP: 107/59 (10/03 1500) Pulse Rate: 90 (10/03 1500)  Labs: Recent Labs    02/26/22 1225 02/26/22 1423  HGB 15.4*  --   HCT 47.8*  --   PLT 246  --   CREATININE 1.40*  --   TROPONINIHS 68* 114*    Estimated Creatinine Clearance: 37.8 mL/min (A) (by C-G formula based on SCr of 1.4 mg/dL (H)).   Medical History: Past Medical History:  Diagnosis Date   Hypertension    Kidney damage     Medications:  No anticoagulants per pharmacist review  Assessment: 66yo female presents with respiratory distress. Elevated troponin in ED.  Pharmacy consulted to initiate and manage heparin infusion.  Baseline aPTT and INR are pending.  Baseline CBC acceptable.  Goal of Therapy:  Heparin level 0.3-0.7 units/ml Monitor platelets by anticoagulation protocol: Yes   Plan:  Give heparin 4000 units IV x 1 Bolus Start heparin drip a 800 units/hr Check heparin level 6 hours after initiation of drip Check daily CBC  Lorin Picket 02/26/2022,3:29 PM

## 2022-02-26 NOTE — Assessment & Plan Note (Addendum)
Hyperglycemia - Insulin SSI with at bedtime coverage ordered - Goal inpatient blood glucose levels 140-180

## 2022-02-26 NOTE — Assessment & Plan Note (Addendum)
-   Continue heparin GGT - Differentials for elevated troponin includes demand ischemia in setting of hypoxia - Echo has been ordered, trend high sensitive troponin - Cardiologist has been consulted by EDP, we appreciate further recommendation

## 2022-02-26 NOTE — ED Triage Notes (Signed)
Pt arrives in resp distress. Pt with cyanotic nailbeds, ra pox of 62%. Pt placed on oxygen at 15lpm nrb with rebound pox after approx 3 minutes to mid 90s. Pt arrives not able to speak in full sentences that has improved with oxygen. Pt states has had a fever and some diarrhea.

## 2022-02-26 NOTE — ED Provider Notes (Signed)
[facility] Provider Note    Event Date/Time   First MD Initiated Contact with Patient 02/26/22 1232     (approximate)   History   Respiratory Distress   HPI  Helen Benson is a 66 y.o. female with past medical history significant for tobacco use, who presents to the emergency department with shortness of breath.  Patient endorses a 2-day history of shortness of breath with cough and congestion.  Does endorse sputum production.  States that today she had significantly worsening shortness of breath.  When EMS arrived patient was hypoxic and cyanotic in the 60s.  No prior hospitalization.  No prior need for albuterol inhalers in the past.  Denies history of PE or DVT.  Denies any chest pain.      Physical Exam   Triage Vital Signs: ED Triage Vitals  Enc Vitals Group     BP 02/26/22 1221 (!) 171/69     Pulse Rate 02/26/22 1221 87     Resp 02/26/22 1221 (!) 24     Temp 02/26/22 1221 98.9 F (37.2 C)     Temp src --      SpO2 02/26/22 1221 100 %     Weight 02/26/22 1222 160 lb (72.6 kg)     Height 02/26/22 1222 5\' 3"  (1.6 m)     Head Circumference --      Peak Flow --      Pain Score --      Pain Loc --      Pain Edu? --      Excl. in [place]? --     Most recent vital signs: Vitals:   02/26/22 1400 02/26/22 1500  BP:  (!) 107/59  Pulse: 95 90  Resp:  20  Temp:    SpO2:  94%     General: Awake, daughter at bedside CV:  Good peripheral perfusion.  No lower extremity edema Resp:  Hypoxic with diffuse inspiratory and expiratory wheezing throughout all lung fields.  Hypoxic in the 70s requiring 6 L nasal cannula Abd:  No distention.  Nontender to palpation Other:  No JVD   ED Results / Procedures / Treatments   Labs (all labs ordered are listed, but only abnormal results are displayed) Labs Reviewed  LACTIC ACID, PLASMA - Abnormal; Notable for the following components:      Result Value   Lactic Acid, Venous 2.0 (*)    All other  components within normal limits  LACTIC ACID, PLASMA - Abnormal; Notable for the following components:   Lactic Acid, Venous 2.8 (*)    All other components within normal limits  COMPREHENSIVE METABOLIC PANEL - Abnormal; Notable for the following components:   Chloride 96 (*)    Glucose, Bld 140 (*)    BUN 33 (*)    Creatinine, Ser 1.40 (*)    Total Bilirubin 1.3 (*)    GFR, Estimated 41 (*)    All other components within normal limits  CBC WITH DIFFERENTIAL/PLATELET - Abnormal; Notable for the following components:   WBC 14.7 (*)    Hemoglobin 15.4 (*)    HCT 47.8 (*)    Neutro Abs 10.5 (*)    Monocytes Absolute 1.7 (*)    All other components within normal limits  BRAIN NATRIURETIC PEPTIDE - Abnormal; Notable for the following components:   B Natriuretic Peptide 255.2 (*)    All other components within normal limits  TROPONIN I (HIGH SENSITIVITY) - Abnormal; Notable for the following  components:   Troponin I (High Sensitivity) 68 (*)    All other components within normal limits  TROPONIN I (HIGH SENSITIVITY) - Abnormal; Notable for the following components:   Troponin I (High Sensitivity) 114 (*)    All other components within normal limits  RESP PANEL BY RT-PCR (FLU A&B, COVID) ARPGX2  RESPIRATORY PANEL BY PCR  APTT  PROTIME-INR  HEPARIN LEVEL (UNFRACTIONATED)  HIV ANTIBODY (ROUTINE TESTING W REFLEX)  TSH  HIV ANTIBODY (ROUTINE TESTING W REFLEX)  TSH     EKG  My interpretation of the EKG -sinus tachycardia.  PR 225.  QTc 435.  Mild ST depression to the lateral leads.  No significant ST elevation.  No signs of acute ischemia or dysrhythmia.  No tachycardic or bradycardic dysrhythmias while on cardiac telemetry.   RADIOLOGY Chest x-ray with no focal findings consistent with pneumonia.  CT angiography with mild interstitial edema.  No findings consistent with pulmonary embolism.  No focal findings consistent with pneumonia.  CT scan was read as no acute pulmonary  embolism.  Pulmonary nodules.  Discussed incidental findings with the patient and her daughter at bedside.    PROCEDURES:  Critical Care performed: Yes, see critical care procedure note(s)  Procedures CRITICAL CARE Performed by: Nathaniel Man   Total critical care time: 65 minutes  Critical care time was exclusive of separately billable procedures and treating other patients.  Critical care was necessary to treat or prevent imminent or life-threatening deterioration.  Critical care was time spent personally by me on the following activities: development of treatment plan with patient and/or surrogate as well as nursing, discussions with consultants, evaluation of patient's response to treatment, examination of patient, obtaining history from patient or surrogate, ordering and performing treatments and interventions, ordering and review of laboratory studies, ordering and review of radiographic studies, pulse oximetry and re-evaluation of patient's condition.    MEDICATIONS ORDERED IN ED: Medications  heparin bolus via infusion 4,000 Units (4,000 Units Intravenous Bolus from Bag 02/26/22 1628)    Followed by  heparin ADULT infusion 100 units/mL (25000 units/260mL) (800 Units/hr Intravenous New Bag/Given 02/26/22 1628)  levothyroxine (SYNTHROID) tablet 75 mcg (has no administration in time range)  acetaminophen (TYLENOL) tablet 650 mg (has no administration in time range)    Or  acetaminophen (TYLENOL) suppository 650 mg (has no administration in time range)  ondansetron (ZOFRAN) tablet 4 mg (has no administration in time range)    Or  ondansetron (ZOFRAN) injection 4 mg (has no administration in time range)  senna-docusate (Senokot-S) tablet 1 tablet (has no administration in time range)  LORazepam (ATIVAN) injection 0.5 mg (has no administration in time range)  insulin aspart (novoLOG) injection 0-15 Units (has no administration in time range)  insulin aspart (novoLOG) injection  0-5 Units (has no administration in time range)  ipratropium-albuterol (DUONEB) 0.5-2.5 (3) MG/3ML nebulizer solution 3 mL (3 mLs Nebulization Given 02/26/22 1255)  methylPREDNISolone sodium succinate (SOLU-MEDROL) 125 mg/2 mL injection 125 mg (125 mg Intravenous Given 02/26/22 1259)  aspirin chewable tablet 324 mg (324 mg Oral Given 02/26/22 1427)  cefTRIAXone (ROCEPHIN) 1 g in sodium chloride 0.9 % 100 mL IVPB (0 g Intravenous Stopped 02/26/22 1506)  iohexol (OMNIPAQUE) 350 MG/ML injection 60 mL (60 mLs Intravenous Contrast Given 02/26/22 1524)     IMPRESSION / MDM / ASSESSMENT AND PLAN / ED COURSE  I reviewed the triage vital signs and the nursing notes.   Differential diagnosis includes, but is not limited to, COPD exacerbation, CHF,  ACS, pulmonary embolism, dissection  Chest x-ray without focal findings consistent with pneumonia.  Patient given treatment for concern of a COPD exacerbation with DuoNebs, IV Solu-Medrol.  Lab work concerning for an elevated troponin likely Mandt ischemia in the setting of severe hypoxia.  However repeat troponin continued to be elevated in the 100s.  Consulted cardiology and discussed with Dr. Saunders Revel, recommended gentle diuresis and heparin infusion.  After being evaluated in the emergency department by Dr. Saunders Revel also recommended a viral panel.  Patient continues to be chest pain-free.  Consulted the hospitalist and admitted the patient for acute hypoxic respiratory failure  Patient's presentation is most consistent with acute presentation with potential threat to life or bodily function.       FINAL CLINICAL IMPRESSION(S) / ED DIAGNOSES   Final diagnoses:  Acute respiratory failure with hypoxia (HCC)  Elevated troponin     Rx / DC Orders   ED Discharge Orders     None        Note:  This document was prepared using Dragon voice recognition software and may include unintentional dictation errors.   Nathaniel Man, MD 02/26/22 1646

## 2022-02-26 NOTE — Consult Note (Signed)
Cardiology Consultation   Patient ID: Helen Benson MRN: [id number]; DOB: 06-04-1955  Admit date: 02/26/2022 Date of Consult: 02/26/2022  PCP:  Helen Lo, NP   Helen Benson Providers Cardiologist:  New - Helen Benson     Patient Profile:   Helen Benson is a 66 y.o. 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 Jori Moll.  History of Present Illness:   [name] began having cold symptoms 3 days ago.  She initially had loose stools and subsequently developed a cough and sore throat.  This morning, she became severely short of breath and wheezy.  It was reminiscent of a prior infection around 2019 or 2020 that she attributes to COVID-19, though she was not tested at that time.  She presented to the emergency department, where he was found to be quite hypoxic and is currently on 6 L of supplemental oxygen via nasal cannula with oxygen saturations remaining in the upper 80s and low 90s.  She denies chest pain, palpitations, lightheadedness, and edema.  Her shortness of breath does not worsen when she lays flat.  She notes that a friend was recently sick with URI symptoms.   Past Medical History:  Diagnosis Date   Erythrocytosis    Hyperlipidemia    Hypertension    Kidney damage    Type 2 diabetes mellitus ([place])     Past Surgical History:  Procedure Laterality Date   BREAST CYST ASPIRATION Bilateral    Bilat. many on both per pt   BREAST CYST EXCISION Left    age 79   JOINT REPLACEMENT       Home Medications:  Prior to Admission medications   Medication Sig Start Date Helen Benson Date Taking? Authorizing Provider  ALPRAZolam Duanne Moron) 0.5 MG tablet Take 1 tablet (0.5 mg total) by mouth 2 (two) times daily as needed for anxiety. 02/12/22  Yes  Masoud, Viann Shove, MD  atenolol (TENORMIN) 25 MG tablet Take 1 tablet (25 mg total) by mouth daily. 02/04/22  Yes Masoud, Viann Shove, MD  chlorthalidone (HYGROTON) 50 MG tablet Take 25 mg by mouth daily. 10/24/20  Yes [provider]  Cholecalciferol (VITAMIN D-3 PO) Take 1 capsule by mouth daily at 2 PM.   Yes [provider]  FARXIGA 10 MG TABS tablet Take 10 mg by mouth daily. 12/26/21  Yes [provider]  levothyroxine (SYNTHROID) 75 MCG tablet Take 1 tablet (75 mcg total) by mouth daily before breakfast. 02/04/22  Yes Masoud, Viann Shove, MD  losartan (COZAAR) 25 MG tablet Take 25 mg by mouth daily. 02/19/22  Yes [provider]  losartan (COZAAR) 50 MG tablet losartan 50 mg tablet 02/04/22  Yes Masoud, Viann Shove, MD  oxyCODONE ER (XTAMPZA ER) 18 MG C12A Xtampza ER 18 mg capsule sprinkle 12/04/20  Yes [provider]  Oxycodone HCl 10 MG TABS Take 10 mg by mouth 3 (three) times daily.   Yes [provider]  rosuvastatin (CRESTOR) 20 MG tablet Take 20 mg by mouth daily.   Yes [provider]  Loratadine (ALLERGY RELIEF 24-HR PO) Take 1 tablet by mouth daily at 2 PM.    [provider]  naloxone [facility]) nasal spray 4 mg/0.1 mL  05/18/20   [provider]    Inpatient Medications: Scheduled Meds:  insulin aspart  0-15  Units Subcutaneous TID WC   insulin aspart  0-5 Units Subcutaneous QHS   [START ON 02/27/2022] levothyroxine  75 mcg Oral QAC breakfast   Continuous Infusions:  heparin 800 Units/hr (02/26/22 1628)   PRN Meds: acetaminophen **OR** acetaminophen, LORazepam, nicotine, ondansetron **OR** ondansetron (ZOFRAN) IV, senna-docusate  Allergies:   No Known Allergies  Social History:   Social History   Socioeconomic History   Marital status: Single    Spouse name: Not on file   Number of children: Not on file   Years of education: Not on file   Highest education level: Not on file  Occupational History   Not on file   Tobacco Use   Smoking status: Every Day    Packs/day: 1.50    Years: 35.00    Total pack years: 52.50    Types: Cigarettes   Smokeless tobacco: Never  Substance and Sexual Activity   Alcohol use: No   Drug use: Never   Sexual activity: Not Currently  Other Topics Concern   Not on file  Social History Narrative   Not on file   Social Determinants of Health   Financial Resource Strain: Not on file  Food Insecurity: Not on file  Transportation Needs: Not on file  Physical Activity: Not on file  Stress: Not on file  Social Connections: Not on file  Intimate Partner Violence: Not on file    Family History:   Family History  Problem Relation Age of Onset   Breast cancer Sister 47   Breast cancer Other 42       neice     ROS:  Please see the history of present illness.   All other ROS reviewed and negative.     Physical Exam/Data:   Vitals:   02/26/22 1330 02/26/22 1400 02/26/22 1500 02/26/22 1630  BP: 120/61  (!) 107/59 (!) 115/50  Pulse:  95 90 81  Resp: 15  20 20   Temp:    98.8 F (37.1 C)  TempSrc:    Oral  SpO2: 95%  94% 92%  Weight:      Height:       No intake or output data in the 24 hours ending 02/26/22 1651    02/26/2022   12:22 PM 01/10/2022   11:26 AM 01/03/2022    9:29 AM  Last 3 Weights  Weight (lbs) 160 lb 178 lb 3.2 oz 178 lb 3.2 oz  Weight (kg) 72.576 kg 80.831 kg 80.831 kg     Body mass index is 28.34 kg/m.  General:  Well nourished, well developed, in no acute distress.  She is accompanied by her daughter. HEENT: normal Neck: no JVD Vascular: No carotid bruits; Distal pulses 2+ bilaterally Cardiac: Heart sounds obscured by rhonchi and wheezes.  Regular rate and rhythm without murmurs. Lungs: Diffuse rhonchi and wheezes with moderately diminished air movement.  No crackles. Abd: soft, nontender, no hepatomegaly  Ext: no edema Musculoskeletal:  No deformities, BUE and BLE strength normal and equal Skin: warm and dry  Neuro:  CNs 2-12  intact, no focal abnormalities noted Psych:  Normal affect   EKG:  The EKG was personally reviewed and demonstrates: Normal sinus rhythm with first-degree AV block, lateral infarct, anterior infarct, and inferolateral ST/T changes.  No prior tracing available for comparison. Telemetry:  Telemetry was personally reviewed and demonstrates: Normal sinus rhythm.  Relevant CV Studies: None  Laboratory Data:  High Sensitivity Troponin:   Recent Labs  Lab 02/26/22 1225 02/26/22 1423  TROPONINIHS 68* 114*     Chemistry Recent Labs  Lab 02/26/22 1225  NA 136  K 3.5  CL 96*  CO2 28  GLUCOSE 140*  BUN 33*  CREATININE 1.40*  CALCIUM 9.7  GFRNONAA 41*  ANIONGAP 12    Recent Labs  Lab 02/26/22 1225  PROT 7.8  ALBUMIN 4.2  AST 30  ALT 19  ALKPHOS 57  BILITOT 1.3*   Lipids No results for input(s): "CHOL", "TRIG", "HDL", "LABVLDL", "LDLCALC", "CHOLHDL" in the last 168 hours.  Hematology Recent Labs  Lab 02/26/22 1225  WBC 14.7*  RBC 5.06  HGB 15.4*  HCT 47.8*  MCV 94.5  MCH 30.4  MCHC 32.2  RDW 13.1  PLT 246   Thyroid No results for input(s): "TSH", "FREET4" in the last 168 hours.  BNP Recent Labs  Lab 02/26/22 1225  BNP 255.2*    DDimer No results for input(s): "DDIMER" in the last 168 hours.   Radiology/Studies:  CT Angio Chest Pulmonary Embolism (PE) W or WO Contrast  Result Date: 02/26/2022 CLINICAL DATA:  Respiratory distress. EXAM: CT ANGIOGRAPHY CHEST WITH CONTRAST TECHNIQUE: Multidetector CT imaging of the chest was performed using the standard protocol during bolus administration of intravenous contrast. Multiplanar CT image reconstructions and MIPs were obtained to evaluate the vascular anatomy. RADIATION DOSE REDUCTION: This exam was performed according to the departmental dose-optimization program which includes automated exposure control, adjustment of the mA and/or kV according to patient size and/or use of iterative reconstruction technique.  CONTRAST:  41mL OMNIPAQUE IOHEXOL 350 MG/ML SOLN COMPARISON:  None Available. FINDINGS: Cardiovascular: Satisfactory opacification of the pulmonary arteries to the segmental level. No evidence of pulmonary embolism. Normal heart size. No pericardial effusion. Mediastinum/Nodes: No enlarged mediastinal, hilar, or axillary lymph nodes. Thyroid gland, trachea, and esophagus demonstrate no significant findings. Lungs/Pleura: No pneumothorax or pleural effusion is noted. 3 mm nodule is noted anteriorly in right upper lobe best seen on image number 31 of series 6. 6 mm subpleural nodule is noted posteriorly in the left lower lobe best seen on image number 52 of series 6. Mild left basilar subsegmental atelectasis or scarring is noted. Upper Abdomen: No acute abnormality. Musculoskeletal: No chest wall abnormality. No acute or significant osseous findings. Review of the MIP images confirms the above findings. IMPRESSION: No definite evidence of pulmonary embolus. Bilateral pulmonary nodules are noted, the largest measuring 6 mm in the left lower lobe. Non-contrast chest CT at 3-6 months is recommended. If the nodules are stable at time of repeat CT, then future CT at 18-24 months (from today's scan) is considered optional for low-risk patients, but is recommended for high-risk patients. This recommendation follows the consensus statement: Guidelines for Management of Incidental Pulmonary Nodules Detected on CT Images: From the Fleischner Society 2017; Radiology 2017; 284:228-243. Electronically Signed   By: Marijo Conception M.D.   On: 02/26/2022 15:43   DG Chest 1 View  Result Date: 02/26/2022 CLINICAL DATA:  Shortness of breath EXAM: CHEST  1 VIEW COMPARISON:  10/23/2015 FINDINGS: Cardiac size is within normal limits. Lung fields are clear of any infiltrates or pulmonary edema. There is no pleural effusion or pneumothorax. Degenerative changes are noted in both shoulders. IMPRESSION: No active disease. Electronically  Signed   By: Elmer Picker M.D.   On: 02/26/2022 12:52     Assessment and Plan:   Elevated troponin: The patient's constellation of symptoms are most consistent with again acute respiratory infection leading to acute hypoxic respiratory failure and  supply-demand mismatch.  Acute heart failure is a consideration, though the patient does not appear significantly volume overloaded on exam.  However, her BNP was elevated.  CTA of the chest also shows possible interstitial edema. -Continue heparin infusion and aspirin. -Trend high-sensitivity troponin I until it has peaked, then stop. -Obtain echocardiogram. -Based on work-up, will need to consider cardiac catheterization versus noninvasive ischemia testing. -Continue rosuvastatin. -Check lipid panel with morning labs.  Acute respiratory failure with hypoxia: Degree of hypoxia is quite profound.  Based on her 3, exam findings, and lab abnormalities that include leukocytosis, I am most concerned for a respiratory infection.  CTA chest did not demonstrate obvious consolidation though faint nodular and groundglass opacities are evident and may represent a developing viral pneumonitis.  Of note, influenza and COVID-19 assays were negative.  However, I am concerned that some other viral pathogen may be contributing. -Continue supplemental oxygen and supportive care per IM.  Low threshold for high flow nasal cannula or BiPAP. -Obtain complete viral respiratory panel. -Gentle diuresis.  I begin with furosemide 20 mg IV x1 with close monitoring of urine output in order to avoid overdiuresis in the setting of chronic kidney disease and contrast administration for CTA.  Hypertension: Blood pressure labile. -Hold losartan and chlorthalidone for the time being.  Type 2 diabetes mellitus: -Per internal medicine.  Hyperlipidemia: -Check lipid panel with morning labs. -Continue rosuvastatin 20 mg daily.  For questions or updates, please contact [place] Please consult [email or web address] for contact info under [facility] Cardiology.  Signed, Nelva Bush, MD  02/26/2022 4:51 PM

## 2022-02-26 NOTE — Assessment & Plan Note (Signed)
-   Rosuvastatin 20 mg nightly resumed 

## 2022-02-26 NOTE — Assessment & Plan Note (Signed)
-   Atenolol 25 mg daily, chlorthalidone 25 mg daily, losartan 50 mg daily resumed

## 2022-02-26 NOTE — Assessment & Plan Note (Signed)
At baseline 

## 2022-02-26 NOTE — Assessment & Plan Note (Addendum)
-   Patient endorses readiness to stop tobacco use. - Greater than 5 minutes spent counseling patient and her daughter at bedside regarding tobacco cessation - Patient and daughter at bedside endorses understanding and states they will try the below - Tobacco cessation counseling:  1. Week one after discharge, smoke 19 cigarettes per day. Week two, smoke 18 cigarettes per day. Week three, smoke 17 cigarettes per day, continue until smoking half the amount of cigarettes per day, continue this until she is down to approximately 10 cigarettes/day 2. Discuss with PCP for pharmacologic assistance with smoking cessation 3. Clean all indoor clothing, sheets, blankets, and freshen textile furniture to rid the smell of cigarettes 4. Only smoke outside and wear outer covering 5. Leave cigarettes and lighters outside in separate places 6. During in-between cigarettes, if you feel the urge to smoke, use the following: stress squeezing devices/phone a trusted friend to talk you through the urge 7. Avoid prolong interactions with individuals actively smoking cigarettes 8. If you smoke in social setting, avoid social setting where cigarette smoking is expected or considered acceptable  9. If missing the feeling of holding a cigarette, cut a sipping straw to the length of a cigarette and hold it between your fingers 10. Call [facility] if in need of nicotine patches to help with cessation

## 2022-02-26 NOTE — H&P (Addendum)
History and Physical   Helen Benson XKG:[id number] DOB: [birth year] DOA: 02/26/2022  PCP: Theresia Lo, NP  Outpatient Specialists: Dr. Tasia Catchings, hematology Patient coming from: Home  I have personally briefly reviewed patient's old medical records in [place].  Chief Concern: Shortness of breath  HPI: [name] is a 66 year old female with history of hypertension, hyperlipidemia, erythrocytosis, anxiety, hypothyroid, CKD stage IIIa, obesity, tobacco use, non-insulin-dependent diabetes mellitus, who presents to the emergency department for chief concerns of shortness of breath.  Of note, patient was reported to have SPO2 of 62% on room air.  Initial vitals in the emergency department showed temperature of 98.9, respiration rate 21, heart rate of 82, blood pressure 123/70, SPO2 of 100% on 15 L nonrebreather.  Serum sodium is 136, potassium 2.5, chloride 96, bicarb 28, BUN of 33, serum creatinine of 1.40, GFR 41, nonfasting blood glucose 140, WBC 14.7, hemoglobin 15.4, platelets of 246.  Lactic acid was 2.0 and increased to 2.8.  High sensitive troponin was 68 and increased to 114.  BNP was elevated at 255.2.  COVID/influenza A/influenza B PCR were negative.  ED treatment: Aspirin 324 mg p.o. one-time dose, heparin bolus and gtt., Solu-Medrol 125 mg IV, DuoNebs one-time dose, ceftriaxone 1 g IV. -------------------------------------- At bedside, she is able to tell me her name, age, current calendar year, and the current location.  There is a sensory respiratory muscle use.  High flow nasal cannula at 15 L in place.  She reports she has been feeling short of breath since Sunday. She endorses sick contacts as one of her girlfriends was sick with influenza recently. She endorses a cough, that is productive of light yellow sputum.   She denies trauma to her person. She denies nausea, vomiting. She endorses diarrhea on Sunday, 2-3x times, brown, loose. She denies recent  antibiotics use. She denies recent hospitalization. She denies diarrhea yesterday and today.  She denies fever, chest pain, abdominal pain. She denies dysuria, hematuria.   Social history: She lives by herself. She smokes, 1.5 ppd, she started at age 3. She denies etoh and recreational drug use. She is retired and worked in a [employer].  ROS: Constitutional: no weight change, no fever ENT/Mouth: no sore throat, no rhinorrhea Eyes: no eye pain, no vision changes Cardiovascular: no chest pain, + dyspnea,  no edema, no palpitations Respiratory: + cough, no sputum, no wheezing Gastrointestinal: no nausea, no vomiting, no diarrhea, no constipation Genitourinary: no urinary incontinence, no dysuria, no hematuria Musculoskeletal: no arthralgias, no myalgias Skin: no skin lesions, no pruritus, Neuro: + weakness, no loss of consciousness, no syncope Psych: no anxiety, no depression, + decrease appetite Heme/Lymph: no bruising, no bleeding  ED Course: Discussed with emergency medicine provider, patient requiring hospitalization for chief concerns of acute hypoxic respiratory failure and elevated high sensitive troponin  Assessment/Plan  Principal Problem:   Acute hypoxemic respiratory failure (HCC) Active Problems:   Anxiety   Diabetes 1.5, managed as type 2 (HCC)   Primary hypertension   Smoker   Hypothyroidism   Stage 3a chronic kidney disease (HCC)   Dyslipidemia   Elevated troponin   Assessment and Plan:  * Acute hypoxemic respiratory failure (HCC) - Patient was documented to be hypoxic, at 62% on room air with no prior history of oxygen supplementation requirement - Continue high flow nasal cannula to maintain SPO2 greater than 92%, admit to stepdown - Etiology work-up in progress - I suspect this is secondary to COPD exacerbation versus  acute bronchitis with likely upper respiratory/viral infection in a patient who smokes approximately 1.5 packs/day and she has  been smoking since her 52s - Patient reports no prior diagnosis of COPD - Check procalcitonin, if elevated we will initiate antibiotics - Azithromycin 500 mg IV for anti-inflammatory benefits - 20 pathogen respiratory panel by PCR ordered - DuoNebs 3 times daily, 3 additional doses ordered - Status post Solu-Medrol 125 mg IV per EDP - I have ordered Solu-Medrol 40 mg IV twice daily, 2 doses ordered  Elevated troponin - Continue heparin GGT - Differentials for elevated troponin includes demand ischemia in setting of hypoxia - Echo has been ordered, trend high sensitive troponin - Cardiologist has been consulted by EDP, we appreciate further recommendation  Dyslipidemia - Rosuvastatin 20 mg nightly resumed  Stage 3a chronic kidney disease ([place]) - At baseline  Hypothyroidism - Levothyroxine 75 mcg daily before breakfast  Smoker - Patient endorses readiness to stop tobacco use. - Greater than 5 minutes spent counseling patient and her daughter at bedside regarding tobacco cessation - Patient and daughter at bedside endorses understanding and states they will try the below - Tobacco cessation counseling:  Week one after discharge, smoke 19 cigarettes per day. Week two, smoke 18 cigarettes per day. Week three, smoke 17 cigarettes per day, continue until smoking half the amount of cigarettes per day, continue this until she is down to approximately 10 cigarettes/day Discuss with PCP for pharmacologic assistance with smoking cessation Clean all indoor clothing, sheets, blankets, and freshen textile furniture to rid the smell of cigarettes Only smoke outside and wear outer covering Leave cigarettes and lighters outside in separate places During in-between cigarettes, if you feel the urge to smoke, use the following: stress squeezing devices/phone a trusted friend to talk you through the urge Avoid prolong interactions with individuals actively smoking cigarettes If you smoke in social  setting, avoid social setting where cigarette smoking is expected or considered acceptable  If missing the feeling of holding a cigarette, cut a sipping straw to the length of a cigarette and hold it between your fingers Call [facility] if in need of nicotine patches to help with cessation  Primary hypertension - Atenolol 25 mg daily, chlorthalidone 25 mg daily, losartan 50 mg daily resumed  Diabetes 1.5, managed as type 2 ([place]) Hyperglycemia - Insulin SSI with at bedtime coverage ordered - Goal inpatient blood glucose levels 140-180  Anxiety - Per documentation on 01/08/2021, patient was prescribed paroxetine 10 mg daily as patient was recently separated from her husband and now living with her sister and daughter-in-law - Resumed home alprazolam 0.5 mg p.o. twice daily as needed for anxiety  Chart reviewed.   DVT prophylaxis: Heparin GGT Code Status: Full code Diet: Heart healthy Family Communication: Updated daughter at bedside with patient's permission Disposition Plan: Pending clinical course Consults called: Cardiology Admission status: Inpatient, stepdown  Past Medical History:  Diagnosis Date   Erythrocytosis    Hyperlipidemia    Hypertension    Kidney damage    Type 2 diabetes mellitus ([place])    Past Surgical History:  Procedure Laterality Date   BREAST CYST ASPIRATION Bilateral    Bilat. many on both per pt   BREAST CYST EXCISION Left    age 56   JOINT REPLACEMENT     Social History:  reports that she has been smoking cigarettes. She has a 52.50 pack-year smoking history. She has never used smokeless tobacco. She reports that she does not drink alcohol  and does not use drugs.  No Known Allergies Family History  Problem Relation Age of Onset   Breast cancer Sister 29   Breast cancer Other 67       [place]   Family history: Family history reviewed and not pertinent.  Prior to Admission medications   Medication Sig Start Date End Date Taking? Authorizing  Provider  ALPRAZolam Duanne Moron) 0.5 MG tablet Take 1 tablet (0.5 mg total) by mouth 2 (two) times daily as needed for anxiety. 02/12/22   Cletis Athens, MD  atenolol (TENORMIN) 25 MG tablet Take 1 tablet (25 mg total) by mouth daily. 02/04/22   Cletis Athens, MD  chlorthalidone (HYGROTON) 50 MG tablet Take 25 mg by mouth daily. 10/24/20   [provider]  Cholecalciferol (VITAMIN D-3 PO) Take 1 capsule by mouth daily at 2 PM.    [provider]  FARXIGA 10 MG TABS tablet Take 10 mg by mouth daily. 12/26/21   [provider]  levothyroxine (SYNTHROID) 75 MCG tablet Take 1 tablet (75 mcg total) by mouth daily before breakfast. 02/04/22   Cletis Athens, MD  Loratadine (ALLERGY RELIEF 24-HR PO) Take 1 tablet by mouth daily at 2 PM.    [provider]  losartan (COZAAR) 50 MG tablet losartan 50 mg tablet 02/04/22   Cletis Athens, MD  naloxone [facility]) nasal spray 4 mg/0.1 mL  05/18/20   [provider]  oxyCODONE ER (XTAMPZA ER) 18 MG C12A Xtampza ER 18 mg capsule sprinkle 12/04/20   [provider]  Oxycodone HCl 10 MG TABS Take 10 mg by mouth 3 (three) times daily.    [provider]  rosuvastatin (CRESTOR) 20 MG tablet Take 20 mg by mouth daily.    [provider]    Physical Exam: Vitals:   02/26/22 1330 02/26/22 1400 02/26/22 1500 02/26/22 1630  BP: 120/61  (!) 107/59 (!) 115/50  Pulse:  95 90 81  Resp: 15  20 20   Temp:    98.8 F (37.1 C)  TempSrc:    Oral  SpO2: 95%  94% 92%  Weight:      Height:       Constitutional: appears age-appropriate, frail, NAD, calm, comfortable Eyes: PERRL, lids and conjunctivae normal ENMT: Mucous membranes are moist. Posterior pharynx clear of any exudate or lesions. Age-appropriate dentition. Hearing appropriate Neck: normal, supple, no masses, no thyromegaly Respiratory: Diffuse expiratory wheezing on auscultation, no crackles.  Increased respiratory effort.  Increased accessory muscle use.   Cardiovascular: Regular rate and rhythm, no murmurs / rubs / gallops. No extremity edema. 2+ pedal pulses. No carotid bruits.  Abdomen: no tenderness, no masses palpated, no hepatosplenomegaly. Bowel sounds positive.  Musculoskeletal: no clubbing / cyanosis. No joint deformity upper and lower extremities. Good ROM, no contractures, no atrophy. Normal muscle tone.  Skin: no rashes, lesions, ulcers. No induration Neurologic: Sensation intact. Strength 5/5 in all 4.  Psychiatric: Normal judgment and insight. Alert and oriented x 3. Normal mood.   EKG: independently reviewed, showing sinus tachycardia with rate of 101, QTc 435  Chest x-ray on Admission: I personally reviewed and I agree with radiologist reading as below.  CT Angio Chest Pulmonary Embolism (PE) W or WO Contrast  Result Date: 02/26/2022 CLINICAL DATA:  Respiratory distress. EXAM: CT ANGIOGRAPHY CHEST WITH CONTRAST TECHNIQUE: Multidetector CT imaging of the chest was performed using the standard protocol during bolus administration of intravenous contrast. Multiplanar CT image reconstructions and MIPs were obtained to evaluate the vascular anatomy.  RADIATION DOSE REDUCTION: This exam was performed according to the departmental dose-optimization program which includes automated exposure control, adjustment of the mA and/or kV according to patient size and/or use of iterative reconstruction technique. CONTRAST:  46mL OMNIPAQUE IOHEXOL 350 MG/ML SOLN COMPARISON:  None Available. FINDINGS: Cardiovascular: Satisfactory opacification of the pulmonary arteries to the segmental level. No evidence of pulmonary embolism. Normal heart size. No pericardial effusion. Mediastinum/Nodes: No enlarged mediastinal, hilar, or axillary lymph nodes. Thyroid gland, trachea, and esophagus demonstrate no significant findings. Lungs/Pleura: No pneumothorax or pleural effusion is noted. 3 mm nodule is noted anteriorly in right upper lobe best seen on image number 31  of series 6. 6 mm subpleural nodule is noted posteriorly in the left lower lobe best seen on image number 52 of series 6. Mild left basilar subsegmental atelectasis or scarring is noted. Upper Abdomen: No acute abnormality. Musculoskeletal: No chest wall abnormality. No acute or significant osseous findings. Review of the MIP images confirms the above findings. IMPRESSION: No definite evidence of pulmonary embolus. Bilateral pulmonary nodules are noted, the largest measuring 6 mm in the left lower lobe. Non-contrast chest CT at 3-6 months is recommended. If the nodules are stable at time of repeat CT, then future CT at 18-24 months (from today's scan) is considered optional for low-risk patients, but is recommended for high-risk patients. This recommendation follows the consensus statement: Guidelines for Management of Incidental Pulmonary Nodules Detected on CT Images: From the Fleischner Society 2017; Radiology 2017; 284:228-243. Electronically Signed   By: Marijo Conception M.D.   On: 02/26/2022 15:43   DG Chest 1 View  Result Date: 02/26/2022 CLINICAL DATA:  Shortness of breath EXAM: CHEST  1 VIEW COMPARISON:  10/23/2015 FINDINGS: Cardiac size is within normal limits. Lung fields are clear of any infiltrates or pulmonary edema. There is no pleural effusion or pneumothorax. Degenerative changes are noted in both shoulders. IMPRESSION: No active disease. Electronically Signed   By: Elmer Picker M.D.   On: 02/26/2022 12:52    Labs on Admission: I have personally reviewed following labs CBC: Recent Labs  Lab 02/26/22 1225  WBC 14.7*  NEUTROABS 10.5*  HGB 15.4*  HCT 47.8*  MCV 94.5  PLT 124   Basic Metabolic Panel: Recent Labs  Lab 02/26/22 1225  NA 136  K 3.5  CL 96*  CO2 28  GLUCOSE 140*  BUN 33*  CREATININE 1.40*  CALCIUM 9.7   GFR: Estimated Creatinine Clearance: 37.8 mL/min (A) (by C-G formula based on SCr of 1.4 mg/dL (H)).  Liver Function Tests: Recent Labs  Lab  02/26/22 1225  AST 30  ALT 19  ALKPHOS 57  BILITOT 1.3*  PROT 7.8  ALBUMIN 4.2   Urine analysis:    Component Value Date/Time   COLORURINE YELLOW (A) 10/23/2015 1302   APPEARANCEUR CLEAR (A) 10/23/2015 1302   LABSPEC 1.013 10/23/2015 1302   PHURINE 5.0 10/23/2015 1302   GLUCOSEU NEGATIVE 10/23/2015 1302   HGBUR NEGATIVE 10/23/2015 1302   BILIRUBINUR NEGATIVE 10/23/2015 1302   KETONESUR NEGATIVE 10/23/2015 1302   PROTEINUR NEGATIVE 10/23/2015 1302   NITRITE NEGATIVE 10/23/2015 1302   LEUKOCYTESUR 2+ (A) 10/23/2015 1302   CRITICAL CARE Performed by: Dr. Tobie Poet  Total critical care time: 35 minutes  Critical care time was exclusive of separately billable procedures and treating other patients.  Critical care was necessary to treat or prevent imminent or life-threatening deterioration.  Critical care was time spent personally by me on the following activities: development of  treatment plan with patient and/or surrogate as well as nursing, discussions with consultants, evaluation of patient's response to treatment, examination of patient, obtaining history from patient or surrogate, ordering and performing treatments and interventions, ordering and review of laboratory studies, ordering and review of radiographic studies, pulse oximetry and re-evaluation of patient's condition.  Dr. Tobie Poet [facility]  If 7PM-7AM, please contact overnight-coverage provider If 7AM-7PM, please contact day coverage provider [email or web address]  02/26/2022, 7:03 PM

## 2022-02-26 NOTE — Assessment & Plan Note (Signed)
-   Levothyroxine 75 mcg daily before breakfast 

## 2022-02-26 NOTE — Assessment & Plan Note (Addendum)
-   Patient was documented to be hypoxic, at 62% on room air with no prior history of oxygen supplementation requirement - Continue high flow nasal cannula to maintain SPO2 greater than 92%, admit to stepdown - Etiology work-up in progress - I suspect this is secondary to COPD exacerbation versus acute bronchitis with likely upper respiratory/viral infection in a patient who smokes approximately 1.5 packs/day and she has been smoking since her 61s - Patient reports no prior diagnosis of COPD - Check procalcitonin, if elevated we will initiate antibiotics - Azithromycin 500 mg IV for anti-inflammatory benefits - 20 pathogen respiratory panel by PCR ordered - DuoNebs 3 times daily, 3 additional doses ordered - Status post Solu-Medrol 125 mg IV per EDP - I have ordered Solu-Medrol 40 mg IV twice daily, 2 doses ordered

## 2022-02-27 ENCOUNTER — Inpatient Hospital Stay (HOSPITAL_COMMUNITY)
Admit: 2022-02-27 | Discharge: 2022-02-27 | Disposition: A | Discharge status: Home / Self Care | Payer: Medicare Other | Attending: Internal Medicine | Admitting: Internal Medicine

## 2022-02-27 DIAGNOSIS — R778 Other specified abnormalities of plasma proteins: Secondary | ICD-10-CM

## 2022-02-27 DIAGNOSIS — R7989 Other specified abnormal findings of blood chemistry: Secondary | ICD-10-CM | POA: Diagnosis not present

## 2022-02-27 DIAGNOSIS — J9601 Acute respiratory failure with hypoxia: Secondary | ICD-10-CM | POA: Diagnosis not present

## 2022-02-27 DIAGNOSIS — E876 Hypokalemia: Secondary | ICD-10-CM | POA: Diagnosis not present

## 2022-02-27 DIAGNOSIS — F17200 Nicotine dependence, unspecified, uncomplicated: Secondary | ICD-10-CM | POA: Diagnosis not present

## 2022-02-27 DIAGNOSIS — I10 Essential (primary) hypertension: Secondary | ICD-10-CM | POA: Diagnosis not present

## 2022-02-27 LAB — HIV ANTIBODY (ROUTINE TESTING W REFLEX): HIV Screen 4th Generation wRfx: NONREACTIVE

## 2022-02-27 LAB — ECHOCARDIOGRAM COMPLETE
AR max vel: 1.98 cm2
AV Area VTI: 2.63 cm2
AV Area mean vel: 1.89 cm2
AV Mean grad: 8.5 mmHg
AV Peak grad: 15.8 mmHg
Ao pk vel: 1.99 m/s
Area-P 1/2: 2.93 cm2
Height: 63 in
MV VTI: 2.09 cm2
S' Lateral: 2 cm
Weight: 2560 oz

## 2022-02-27 LAB — RESPIRATORY PANEL BY PCR

## 2022-02-27 LAB — BASIC METABOLIC PANEL
Anion gap: 6 (ref 5–15)
BUN: 32 mg/dL — ABNORMAL HIGH (ref 8–23)
CO2: 30 mmol/L (ref 22–32)
Calcium: 9.3 mg/dL (ref 8.9–10.3)
Chloride: 104 mmol/L (ref 98–111)
Creatinine, Ser: 1.04 mg/dL — ABNORMAL HIGH (ref 0.44–1.00)
GFR, Estimated: 59 mL/min — ABNORMAL LOW (ref >60)
Glucose, Bld: 177 mg/dL — ABNORMAL HIGH (ref 70–99)
Potassium: 2.8 mmol/L — ABNORMAL LOW (ref 3.5–5.1)
Sodium: 140 mmol/L (ref 135–145)

## 2022-02-27 LAB — GLUCOSE, CAPILLARY
Glucose-Capillary: 152 mg/dL — ABNORMAL HIGH (ref 70–99)
Glucose-Capillary: 183 mg/dL — ABNORMAL HIGH (ref 70–99)
Glucose-Capillary: 134 mg/dL — ABNORMAL HIGH (ref 70–99)
Glucose-Capillary: 155 mg/dL — ABNORMAL HIGH (ref 70–99)

## 2022-02-27 LAB — LACTIC ACID, PLASMA: Lactic Acid, Venous: 2 mmol/L (ref 0.5–1.9)

## 2022-02-27 LAB — HEPARIN LEVEL (UNFRACTIONATED)
Heparin Unfractionated: <0.1 IU/mL — ABNORMAL LOW (ref 0.30–0.70)
Heparin Unfractionated: 0.45 IU/mL (ref 0.30–0.70)
Heparin Unfractionated: 0.39 IU/mL (ref 0.30–0.70)

## 2022-02-27 LAB — LIPID PANEL
Cholesterol: 82 mg/dL (ref 0–200)
HDL: 29 mg/dL — ABNORMAL LOW (ref >40)
LDL Cholesterol: 39 mg/dL (ref 0–99)
Total CHOL/HDL Ratio: 2.8 RATIO
Triglycerides: 69 mg/dL (ref <150)
VLDL: 14 mg/dL (ref 0–40)

## 2022-02-27 LAB — CBC
HCT: 43.4 % (ref 36.0–46.0)
Hemoglobin: 14.4 g/dL (ref 12.0–15.0)
MCH: 31 pg (ref 26.0–34.0)
MCHC: 33.2 g/dL (ref 30.0–36.0)
MCV: 93.3 fL (ref 80.0–100.0)
Platelets: 189 10*3/uL (ref 150–400)
RBC: 4.65 MIL/uL (ref 3.87–5.11)
RDW: 13 % (ref 11.5–15.5)
WBC: 7.9 10*3/uL (ref 4.0–10.5)
nRBC: 0 % (ref 0.0–0.2)

## 2022-02-27 LAB — TROPONIN I (HIGH SENSITIVITY): Troponin I (High Sensitivity): 43 ng/L — ABNORMAL HIGH (ref <18)

## 2022-02-27 MED ORDER — ASPIRIN 81 MG PO TBEC
81.0000 mg | DELAYED_RELEASE_TABLET | Freq: Every day | ORAL | Status: DC
  Administered 2022-02-28 – 2022-03-05 (×6): 81 mg via ORAL
  Filled 2022-02-27 (×6): qty 1

## 2022-02-27 MED ORDER — HEPARIN BOLUS VIA INFUSION
2000.0000 [IU] | Freq: Once | INTRAVENOUS | Status: AC
  Administered 2022-02-27: 2000 [IU] via INTRAVENOUS
  Filled 2022-02-27: qty 2000

## 2022-02-27 MED ORDER — ORAL CARE MOUTH RINSE: 15.0000 mL | OROMUCOSAL | Status: DC | PRN

## 2022-02-27 MED ORDER — LEVALBUTEROL HCL 0.63 MG/3ML IN NEBU
0.6300 mg | INHALATION_SOLUTION | Freq: Four times a day (QID) | RESPIRATORY_TRACT | Status: DC | PRN
  Administered 2022-02-27 – 2022-02-28 (×3): 0.63 mg via RESPIRATORY_TRACT
  Filled 2022-02-27 (×3): qty 3

## 2022-02-27 MED ORDER — POTASSIUM CHLORIDE CRYS ER 20 MEQ PO TBCR
80.0000 meq | EXTENDED_RELEASE_TABLET | Freq: Once | ORAL | Status: AC
  Administered 2022-02-27: 80 meq via ORAL
  Filled 2022-02-27: qty 4

## 2022-02-27 MED ORDER — IPRATROPIUM-ALBUTEROL 0.5-2.5 (3) MG/3ML IN SOLN: 3.0000 mL | RESPIRATORY_TRACT | Status: DC | PRN

## 2022-02-27 MED ORDER — CHLORHEXIDINE GLUCONATE CLOTH 2 % EX PADS: 6.0000 | MEDICATED_PAD | Freq: Every day | CUTANEOUS | Status: DC

## 2022-02-27 NOTE — Progress Notes (Signed)
Report called to RN Jess on 1c, pt and family aware of the transfer.

## 2022-02-27 NOTE — Progress Notes (Signed)
PROGRESS NOTE    Helen Benson  WUG:[id number] DOB: Oct 11, 1955 DOA: 02/26/2022 PCP: Theresia Lo, NP   Assessment & Plan:   Principal Problem:   Acute hypoxemic respiratory failure ([place]) Active Problems:   Anxiety   Diabetes 1.5, managed as type 2 ([place])   Primary hypertension   Smoker   Hypothyroidism   Stage 3a chronic kidney disease ([place])   Dyslipidemia   Elevated troponin  Assessment and Plan: Acute hypoxic respiratory failure: etiology unclear. 62% on RA. Continue on supplemental oxygen and wean as tolerated, currently on 4L [place]. CXR shows no active disease. CTA chest neg for PE but b/l pulmonary nodules noted. Will need repeat CT in 3-6 months. Continue on azithromycin for anti-inflammatory benefits. Viral respiratory panel ordered. Continue on bronchodilators & encourage incentive spirometry  Elevated troponins: likely secondary to demand ischemia. Continue on IV heparin x 48 hrs as per cardio. Cardio recs apprec   Hypokalemia: potassium given   HLD: continue on statin  CKDIIIa: Cr is at baseline. Avoid nephrotoxic meds   Hypothyroidism: continue on levothyroxine  Smoker: received smoking cessation counseling > 5 mins as per admitting physician. Pt declines nicotine patch   HTN: continue on atenolol, chlorthalidone, losartan  DM2: likely poorly controlled. Continue on SSI w/ accuchecks  Anxiety: severity unknown. Continue on home dose of paroxetine & xanax          DVT prophylaxis: IV heparin  Code Status: full  Family Communication:  Disposition Plan: likely d/c back home   Level of care: Stepdown  Status is: Inpatient Remains inpatient appropriate because: severity of illness    Consultants:  cardio  Procedures:   Antimicrobials: azithromycin    Subjective: Pt c/o fatigue   Objective: Vitals:   02/27/22 0600 02/27/22 0700 02/27/22 0733 02/27/22 0800  BP: (!) 107/57 126/61  (!) 147/119  Pulse: 61 62  82  Resp: 17 18  17   Temp:     98.3 F (36.8 C)  TempSrc:    Oral  SpO2: 94% 96% 95% 96%  Weight:      Height:       No intake or output data in the 24 hours ending 02/27/22 0931 Filed Weights   02/26/22 1222  Weight: 72.6 kg    Examination:  General exam: Appears calm and comfortable  Respiratory system: diminished breath sounds b/l Cardiovascular system: S1 & S2 +. No rubs, gallops or clicks.  Gastrointestinal system: Abdomen is nondistended, soft and nontender.  Normal bowel sounds heard. Central nervous system: Alert and oriented. Moves all extremities  Psychiatry: Judgement and insight appear normal. Mood & affect appropriate.     Data Reviewed: I have personally reviewed following labs and imaging studies  CBC: Recent Labs  Lab 02/26/22 1225 02/27/22 0432  WBC 14.7* 7.9  NEUTROABS 10.5*  --   HGB 15.4* 14.4  HCT 47.8* 43.4  MCV 94.5 93.3  PLT 246 888   Basic Metabolic Panel: Recent Labs  Lab 02/26/22 1225 02/27/22 0432  NA 136 140  K 3.5 2.8*  CL 96* 104  CO2 28 30  GLUCOSE 140* 177*  BUN 33* 32*  CREATININE 1.40* 1.04*  CALCIUM 9.7 9.3   GFR: Estimated Creatinine Clearance: 50.8 mL/min (A) (by C-G formula based on SCr of 1.04 mg/dL (H)). Liver Function Tests: Recent Labs  Lab 02/26/22 1225  AST 30  ALT 19  ALKPHOS 57  BILITOT 1.3*  PROT 7.8  ALBUMIN 4.2   No results for input(s): "LIPASE", "AMYLASE" in  the last 168 hours. No results for input(s): "AMMONIA" in the last 168 hours. Coagulation Profile: Recent Labs  Lab 02/26/22 1630  INR 1.1   Cardiac Enzymes: No results for input(s): "CKTOTAL", "CKMB", "CKMBINDEX", "TROPONINI" in the last 168 hours. BNP (last 3 results) No results for input(s): "PROBNP" in the last 8760 hours. HbA1C: No results for input(s): "HGBA1C" in the last 72 hours. CBG: Recent Labs  Lab 02/26/22 1800 02/26/22 2323 02/27/22 0717  GLUCAP 188* 228* 152*   Lipid Profile: Recent Labs    02/27/22 0432  CHOL 82  HDL 29*  LDLCALC 39   TRIG 69  CHOLHDL 2.8   Thyroid Function Tests: Recent Labs    02/26/22 1420  TSH 1.115   Anemia Panel: No results for input(s): "VITAMINB12", "FOLATE", "FERRITIN", "TIBC", "IRON", "RETICCTPCT" in the last 72 hours. Sepsis Labs: Recent Labs  Lab 02/26/22 1225 02/26/22 1425 02/26/22 2255 02/27/22 0002  LATICACIDVEN 2.0* 2.8* 2.0* 2.0*    Recent Results (from the past 240 hour(s))  Resp Panel by RT-PCR (Flu A&B, Covid) Anterior Nasal Swab     Status: None   Collection Time: 02/26/22 12:25 PM   Specimen: Anterior Nasal Swab  Result Value Ref Range Status   SARS Coronavirus 2 by RT PCR NEGATIVE NEGATIVE Final    Comment: (NOTE) SARS-CoV-2 target nucleic acids are NOT DETECTED.  The SARS-CoV-2 RNA is generally detectable in upper respiratory specimens during the acute phase of infection. The lowest concentration of SARS-CoV-2 viral copies this assay can detect is 138 copies/mL. A negative result does not preclude SARS-Cov-2 infection and should not be used as the sole basis for treatment or other patient management decisions. A negative result may occur with  improper specimen collection/handling, submission of specimen other than nasopharyngeal swab, presence of viral mutation(s) within the areas targeted by this assay, and inadequate number of viral copies(<138 copies/mL). A negative result must be combined with clinical observations, patient history, and epidemiological information. The expected result is Negative.  Fact Sheet for Patients:  EntrepreneurPulse.com.au  Fact Sheet for Healthcare Providers:  IncredibleEmployment.be  This test is no t yet approved or cleared by the [place] FDA and  has been authorized for detection and/or diagnosis of SARS-CoV-2 by FDA under an Emergency Use Authorization (EUA). This EUA will remain  in effect (meaning this test can be used) for the duration of the COVID-19 declaration under  Section 564(b)(1) of the Act, 21 U.S.C.section 360bbb-3(b)(1), unless the authorization is terminated  or revoked sooner.       Influenza A by PCR NEGATIVE NEGATIVE Final   Influenza B by PCR NEGATIVE NEGATIVE Final    Comment: (NOTE) The Xpert Xpress SARS-CoV-2/FLU/RSV plus assay is intended as an aid in the diagnosis of influenza from Nasopharyngeal swab specimens and should not be used as a sole basis for treatment. Nasal washings and aspirates are unacceptable for Xpert Xpress SARS-CoV-2/FLU/RSV testing.  Fact Sheet for Patients: EntrepreneurPulse.com.au  Fact Sheet for Healthcare Providers: IncredibleEmployment.be  This test is not yet approved or cleared by the [place] FDA and has been authorized for detection and/or diagnosis of SARS-CoV-2 by FDA under an Emergency Use Authorization (EUA). This EUA will remain in effect (meaning this test can be used) for the duration of the COVID-19 declaration under Section 564(b)(1) of the Act, 21 U.S.C. section 360bbb-3(b)(1), unless the authorization is terminated or revoked.  Performed at [facility], [address], [place], [place] [address]  Radiology Studies: CT Angio Chest Pulmonary Embolism (PE) W or WO Contrast  Result Date: 02/26/2022 CLINICAL DATA:  Respiratory distress. EXAM: CT ANGIOGRAPHY CHEST WITH CONTRAST TECHNIQUE: Multidetector CT imaging of the chest was performed using the standard protocol during bolus administration of intravenous contrast. Multiplanar CT image reconstructions and MIPs were obtained to evaluate the vascular anatomy. RADIATION DOSE REDUCTION: This exam was performed according to the departmental dose-optimization program which includes automated exposure control, adjustment of the mA and/or kV according to patient size and/or use of iterative reconstruction technique. CONTRAST:  32mL OMNIPAQUE IOHEXOL 350 MG/ML SOLN COMPARISON:  None  Available. FINDINGS: Cardiovascular: Satisfactory opacification of the pulmonary arteries to the segmental level. No evidence of pulmonary embolism. Normal heart size. No pericardial effusion. Mediastinum/Nodes: No enlarged mediastinal, hilar, or axillary lymph nodes. Thyroid gland, trachea, and esophagus demonstrate no significant findings. Lungs/Pleura: No pneumothorax or pleural effusion is noted. 3 mm nodule is noted anteriorly in right upper lobe best seen on image number 31 of series 6. 6 mm subpleural nodule is noted posteriorly in the left lower lobe best seen on image number 52 of series 6. Mild left basilar subsegmental atelectasis or scarring is noted. Upper Abdomen: No acute abnormality. Musculoskeletal: No chest wall abnormality. No acute or significant osseous findings. Review of the MIP images confirms the above findings. IMPRESSION: No definite evidence of pulmonary embolus. Bilateral pulmonary nodules are noted, the largest measuring 6 mm in the left lower lobe. Non-contrast chest CT at 3-6 months is recommended. If the nodules are stable at time of repeat CT, then future CT at 18-24 months (from today's scan) is considered optional for low-risk patients, but is recommended for high-risk patients. This recommendation follows the consensus statement: Guidelines for Management of Incidental Pulmonary Nodules Detected on CT Images: From the Fleischner Society 2017; Radiology 2017; 284:228-243. Electronically Signed   By: Marijo Conception M.D.   On: 02/26/2022 15:43   DG Chest 1 View  Result Date: 02/26/2022 CLINICAL DATA:  Shortness of breath EXAM: CHEST  1 VIEW COMPARISON:  10/23/2015 FINDINGS: Cardiac size is within normal limits. Lung fields are clear of any infiltrates or pulmonary edema. There is no pleural effusion or pneumothorax. Degenerative changes are noted in both shoulders. IMPRESSION: No active disease. Electronically Signed   By: Elmer Picker M.D.   On: 02/26/2022 12:52         Scheduled Meds:  atenolol  25 mg Oral Daily   Chlorhexidine Gluconate Cloth  6 each Topical Daily   chlorthalidone  25 mg Oral Daily   [START ON 02/28/2022] dapagliflozin propanediol  10 mg Oral Daily   insulin aspart  0-15 Units Subcutaneous TID WC   insulin aspart  0-5 Units Subcutaneous QHS   ipratropium-albuterol  3 mL Nebulization TID   levothyroxine  75 mcg Oral QAC breakfast   losartan  50 mg Oral Daily   oxyCODONE  10 mg Oral TID   potassium chloride  80 mEq Oral Once   rosuvastatin  20 mg Oral QHS   Continuous Infusions:  azithromycin Stopped (02/26/22 2209)   heparin 1,000 Units/hr (02/27/22 0149)     LOS: 1 day    Time spent: 35 mins    Wyvonnia Dusky, MD [facility] Pager [phone]  If 7PM-7AM, please contact night-coverage [email or web address] 02/27/2022, 9:31 AM

## 2022-02-27 NOTE — Progress Notes (Addendum)
       CROSS COVER NOTE  NAME: Helen Benson MRN: [id number] DOB : Dec 26, 1955    Date of Service   02/27/2022   HPI/Events of Note   Message received from nursing reporting M(r)s Benson has inspiratory and expiratory wheezes on assessment. Nebs expired today, will reorder.  0400: Notified by nursing patient experiencing dyspnea with shallow respirations requiring increase in supplemental O2 from 4L to 6L for SPO2 of 92%. BP  177/77.  On bedside evaluation M(r)s Benson has wheezing on auscultation of lungs with shallow breathing, she is visibly anxious. Patient reports breathing has improved since nebulizer and PRN Xanax and she no longer feels dyspneic. She does have some objective increased work of breathing.  BP 177/77 HR 77 RR 24 SPO2 99% on 6L [place]   0655: Notified of elevated BP 202/78 after hydralazine and no change in respiratory status since Solu-Medrol  Interventions   Assessment/Plan:  Acute Hypoxic Respiratory Failure PRN Xopenex, patient does not like jittery feeling from Duoneb Solu-Medrol 40 mg IV CXR --> concerning for acute bronchitis Magnesium for ongoing wheezing Bipap  Hypertension Hydralazine 5 mg--> BP 202/78 after hydralazine Nitro paste  Anxiety Continue Paroxetine and PRN Xanax      This document was prepared using Dragon voice recognition software and may include unintentional dictation errors.  Neomia Glass DNP, MHA, FNP-BC Nurse Practitioner [facility] Pager [phone]

## 2022-02-27 NOTE — Plan of Care (Signed)
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
## 2022-02-27 NOTE — Consult Note (Signed)
[facility] for IV Heparin Indication: chest pain/ACS  No Known Allergies  Patient Measurements: Height: 5\' 3"  (160 cm) Weight: 72.6 kg (160 lb) IBW/kg (Calculated) : 52.4 Heparin Dosing Weight: 67.6 kg  Vital Signs: Temp: 97.9 F (36.6 C) (10/03 2341) Temp Source: Oral (10/03 2341) BP: 102/52 (10/04 0100) Pulse Rate: 69 (10/04 0100)  Labs: Recent Labs    02/26/22 1225 02/26/22 1423 02/26/22 1630 02/26/22 2255  HGB 15.4*  --   --   --   HCT 47.8*  --   --   --   PLT 246  --   --   --   APTT  --   --  35  --   LABPROT  --   --  14.2  --   INR  --   --  1.1  --   HEPARINUNFRC  --   --   --  <0.10*  CREATININE 1.40*  --   --   --   TROPONINIHS 68* 114*  --  59*     Estimated Creatinine Clearance: 37.8 mL/min (A) (by C-G formula based on SCr of 1.4 mg/dL (H)).   Medical History: Past Medical History:  Diagnosis Date   Erythrocytosis    Hyperlipidemia    Hypertension    Kidney damage    Type 2 diabetes mellitus (HCC)     Medications:  No anticoagulants per pharmacist review  Assessment: 66yo female presents with respiratory distress. Elevated troponin in ED.  Pharmacy consulted to initiate and manage heparin infusion.  Baseline aPTT and INR are pending.  Baseline CBC acceptable.  Goal of Therapy:  Heparin level 0.3-0.7 units/ml Monitor platelets by anticoagulation protocol: Yes   10/3 2255 HL <0.10, subtherapeutic  Plan:  Give heparin 2000 units bolus x 1 Increase heparin drip to 1000 units/hr Recheck HL 6 hours after rate change Check daily CBC  Renda Rolls, PharmD, MBA 02/27/2022 1:40 AM

## 2022-02-27 NOTE — Progress Notes (Signed)
Rounding Note    Patient Name: Helen Benson Date of Encounter: 02/27/2022  [facility] Cardiologist: None   Subjective   Patient requiring 4L O2. She denies chest pain, breathing is slightly better.   Inpatient Medications    Scheduled Meds:  atenolol  25 mg Oral Daily   Chlorhexidine Gluconate Cloth  6 each Topical Daily   chlorthalidone  25 mg Oral Daily   [START ON 02/28/2022] dapagliflozin propanediol  10 mg Oral Daily   insulin aspart  0-15 Units Subcutaneous TID WC   insulin aspart  0-5 Units Subcutaneous QHS   ipratropium-albuterol  3 mL Nebulization TID   levothyroxine  75 mcg Oral QAC breakfast   losartan  50 mg Oral Daily   oxyCODONE  10 mg Oral TID   rosuvastatin  20 mg Oral QHS   Continuous Infusions:  azithromycin Stopped (02/26/22 2209)   heparin 1,000 Units/hr (02/27/22 0149)   PRN Meds: acetaminophen **OR** acetaminophen, ALPRAZolam, nicotine, ondansetron **OR** ondansetron (ZOFRAN) IV, senna-docusate   Vital Signs    Vitals:   02/27/22 0400 02/27/22 0500 02/27/22 0600 02/27/22 0733  BP: 117/60 (!) 105/51 (!) 107/57   Pulse: 68 64 61   Resp: 17 15 17    Temp: 98.5 F (36.9 C)     TempSrc:      SpO2: 94% 93% 94% 95%  Weight:      Height:       No intake or output data in the 24 hours ending 02/27/22 0754    02/26/2022   12:22 PM 01/10/2022   11:26 AM 01/03/2022    9:29 AM  Last 3 Weights  Weight (lbs) 160 lb 178 lb 3.2 oz 178 lb 3.2 oz  Weight (kg) 72.576 kg 80.831 kg 80.831 kg      Telemetry    NSR PVCs, HR 60s - Personally Reviewed  ECG    No new - Personally Reviewed  Physical Exam   GEN: No acute distress.   Neck: No JVD Cardiac: RRR, no murmurs, rubs, or gallops.  Respiratory: diffuse wheezing. GI: Soft, nontender, non-distended  MS: No edema; No deformity. Neuro:  Nonfocal  Psych: Normal affect   Labs    High Sensitivity Troponin:   Recent Labs  Lab 02/26/22 1225 02/26/22 1423 02/26/22 2255  02/27/22 0432  TROPONINIHS 68* 114* 59* 43*     Chemistry Recent Labs  Lab 02/26/22 1225 02/27/22 0432  NA 136 140  K 3.5 2.8*  CL 96* 104  CO2 28 30  GLUCOSE 140* 177*  BUN 33* 32*  CREATININE 1.40* 1.04*  CALCIUM 9.7 9.3  PROT 7.8  --   ALBUMIN 4.2  --   AST 30  --   ALT 19  --   ALKPHOS 57  --   BILITOT 1.3*  --   GFRNONAA 41* 59*  ANIONGAP 12 6    Lipids  Recent Labs  Lab 02/27/22 0432  CHOL 82  TRIG 69  HDL 29*  LDLCALC 39  CHOLHDL 2.8    Hematology Recent Labs  Lab 02/26/22 1225 02/27/22 0432  WBC 14.7* 7.9  RBC 5.06 4.65  HGB 15.4* 14.4  HCT 47.8* 43.4  MCV 94.5 93.3  MCH 30.4 31.0  MCHC 32.2 33.2  RDW 13.1 13.0  PLT 246 189   Thyroid  Recent Labs  Lab 02/26/22 1420  TSH 1.115    BNP Recent Labs  Lab 02/26/22 1225  BNP 255.2*    DDimer No results for input(s): "  DDIMER" in the last 168 hours.   Radiology    CT Angio Chest Pulmonary Embolism (PE) W or WO Contrast  Result Date: 02/26/2022 CLINICAL DATA:  Respiratory distress. EXAM: CT ANGIOGRAPHY CHEST WITH CONTRAST TECHNIQUE: Multidetector CT imaging of the chest was performed using the standard protocol during bolus administration of intravenous contrast. Multiplanar CT image reconstructions and MIPs were obtained to evaluate the vascular anatomy. RADIATION DOSE REDUCTION: This exam was performed according to the departmental dose-optimization program which includes automated exposure control, adjustment of the mA and/or kV according to patient size and/or use of iterative reconstruction technique. CONTRAST:  74mL OMNIPAQUE IOHEXOL 350 MG/ML SOLN COMPARISON:  None Available. FINDINGS: Cardiovascular: Satisfactory opacification of the pulmonary arteries to the segmental level. No evidence of pulmonary embolism. Normal heart size. No pericardial effusion. Mediastinum/Nodes: No enlarged mediastinal, hilar, or axillary lymph nodes. Thyroid gland, trachea, and esophagus demonstrate no significant  findings. Lungs/Pleura: No pneumothorax or pleural effusion is noted. 3 mm nodule is noted anteriorly in right upper lobe best seen on image number 31 of series 6. 6 mm subpleural nodule is noted posteriorly in the left lower lobe best seen on image number 52 of series 6. Mild left basilar subsegmental atelectasis or scarring is noted. Upper Abdomen: No acute abnormality. Musculoskeletal: No chest wall abnormality. No acute or significant osseous findings. Review of the MIP images confirms the above findings. IMPRESSION: No definite evidence of pulmonary embolus. Bilateral pulmonary nodules are noted, the largest measuring 6 mm in the left lower lobe. Non-contrast chest CT at 3-6 months is recommended. If the nodules are stable at time of repeat CT, then future CT at 18-24 months (from today's scan) is considered optional for low-risk patients, but is recommended for high-risk patients. This recommendation follows the consensus statement: Guidelines for Management of Incidental Pulmonary Nodules Detected on CT Images: From the Fleischner Society 2017; Radiology 2017; 284:228-243. Electronically Signed   By: Marijo Conception M.D.   On: 02/26/2022 15:43   DG Chest 1 View  Result Date: 02/26/2022 CLINICAL DATA:  Shortness of breath EXAM: CHEST  1 VIEW COMPARISON:  10/23/2015 FINDINGS: Cardiac size is within normal limits. Lung fields are clear of any infiltrates or pulmonary edema. There is no pleural effusion or pneumothorax. Degenerative changes are noted in both shoulders. IMPRESSION: No active disease. Electronically Signed   By: Elmer Picker M.D.   On: 02/26/2022 12:52    Cardiac Studies   Echo ordered  Patient Profile     66 y.o. 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  Assessment & Plan    Elevated troponin - patient presented with SOB/acute hypoxic respiratory failure - no chest pain reported - HS troponin elevated 68>114>59 - BNP 255 - CTA chest showed interstitial edema - started on IV heparin - echo ordered - continue Aspirin, statin and BB - further work-up pending echo  Acute respiratory failure with hypoxia Tobacco use - COVID and Influenza were negative - she reported she had a cold PTA - CTA chest negative for PE - requiring 4L supplemental O2 - steroids and duonebs and abx per IM  HTN - PTA atenolol, Losartan and chlorthalidone continued  - BP good  Hypokalemia - K 2.4 - supplement  DM2 - per IM  HLD - LDL 39 - continue Crestor 20mg  daily  For questions or updates, please contact [place] [place] Please consult [email or web address] for contact info under        Signed, Caryl Manas Ninfa Meeker, PA-C  02/27/2022, 7:54 AM

## 2022-02-27 NOTE — Consult Note (Signed)
[place] for IV Heparin Indication: chest pain/ACS  No Known Allergies  Patient Measurements: Height: 5\' 3"  (160 cm) Weight: 72.6 kg (160 lb) IBW/kg (Calculated) : 52.4 Heparin Dosing Weight: 67.6 kg  Vital Signs: Temp: 98.5 F (36.9 C) (10/04 0400) Temp Source: Oral (10/03 2341) BP: 107/57 (10/04 0600) Pulse Rate: 61 (10/04 0600)  Labs: Recent Labs    02/26/22 1225 02/26/22 1423 02/26/22 1630 02/26/22 2255 02/27/22 0432  HGB 15.4*  --   --   --  14.4  HCT 47.8*  --   --   --  43.4  PLT 246  --   --   --  189  APTT  --   --  35  --   --   LABPROT  --   --  14.2  --   --   INR  --   --  1.1  --   --   HEPARINUNFRC  --   --   --  <0.10*  --   CREATININE 1.40*  --   --   --  1.04*  TROPONINIHS 68* 114*  --  59* 43*     Estimated Creatinine Clearance: 50.8 mL/min (A) (by C-G formula based on SCr of 1.04 mg/dL (H)).   Medical History: Past Medical History:  Diagnosis Date   Erythrocytosis    Hyperlipidemia    Hypertension    Kidney damage    Type 2 diabetes mellitus (HCC)    Assessment: 66yo female presents with respiratory distress. Elevated troponin in ED.  Pharmacy consulted to initiate and monitor heparin infusion.No chronic anticoagulation identifiable on medication review  Baseline aPTT and INR are wnl.  Baseline CBC acceptable.  Goal of Therapy:  Heparin level 0.3-0.7 units/ml Monitor platelets by anticoagulation protocol: Yes   Plan: heparin level therapeutic following recent rate change Continue heparin infusion at 1000 units/hr Recheck heparin level in 6 hours to confirm Check daily CBC  Vallery Sa, PharmD, BCPS 02/27/2022 7:10 AM

## 2022-02-27 NOTE — Consult Note (Signed)
ANTICOAGULATION CONSULT NOTE  Pharmacy Consult for IV Heparin Indication: chest pain/ACS  No Known Allergies  Patient Measurements: Height: 5\' 3"  (160 cm) Weight: 72.6 kg (160 lb) IBW/kg (Calculated) : 52.4 Heparin Dosing Weight: 67.6 kg  Vital Signs: Temp: 98.4 F (36.9 C) (10/04 1300) Temp Source: Oral (10/04 1300) BP: 107/75 (10/04 1100) Pulse Rate: 71 (10/04 1432)  Labs: Recent Labs    02/26/22 1225 02/26/22 1423 02/26/22 1630 02/26/22 2255 02/27/22 0432 02/27/22 0801 02/27/22 1815  HGB 15.4*  --   --   --  14.4  --   --   HCT 47.8*  --   --   --  43.4  --   --   PLT 246  --   --   --  189  --   --   APTT  --   --  35  --   --   --   --   LABPROT  --   --  14.2  --   --   --   --   INR  --   --  1.1  --   --   --   --   HEPARINUNFRC  --   --   --  <0.10*  --  0.45 0.39  CREATININE 1.40*  --   --   --  1.04*  --   --   TROPONINIHS 68* 114*  --  59* 43*  --   --      Estimated Creatinine Clearance: 50.8 mL/min (A) (by C-G formula based on SCr of 1.04 mg/dL (H)).   Medical History: Past Medical History:  Diagnosis Date   Erythrocytosis    Hyperlipidemia    Hypertension    Kidney damage    Type 2 diabetes mellitus (HCC)    Assessment: 66yo female presents with respiratory distress. Elevated troponin in ED.  Pharmacy consulted to initiate and monitor heparin infusion.No chronic anticoagulation identifiable on medication review  Baseline aPTT and INR are wnl.  Baseline CBC acceptable.  10/4 @1815  HL= 0.39  therapeuticx2  Goal of Therapy:  Heparin level 0.3-0.7 units/ml Monitor platelets by anticoagulation protocol: Yes   Plan:  10/4 @1815  HL= 0.39  therapeuticx2 Continue heparin infusion at 1000 units/hr F/u heparin level with am labs Check daily CBC  Chinita Greenland PharmD Clinical Pharmacist 02/27/2022

## 2022-02-27 NOTE — Progress Notes (Signed)
*  PRELIMINARY RESULTS* Echocardiogram 2D Echocardiogram has been performed.  Sherrie Sport 02/27/2022, 9:46 AM

## 2022-02-28 ENCOUNTER — Inpatient Hospital Stay: Payer: Medicare Other

## 2022-02-28 DIAGNOSIS — B348 Other viral infections of unspecified site: Secondary | ICD-10-CM | POA: Diagnosis not present

## 2022-02-28 DIAGNOSIS — E139 Other specified diabetes mellitus without complications: Secondary | ICD-10-CM | POA: Diagnosis not present

## 2022-02-28 DIAGNOSIS — E785 Hyperlipidemia, unspecified: Secondary | ICD-10-CM | POA: Diagnosis not present

## 2022-02-28 DIAGNOSIS — J206 Acute bronchitis due to rhinovirus: Secondary | ICD-10-CM

## 2022-02-28 DIAGNOSIS — J9601 Acute respiratory failure with hypoxia: Secondary | ICD-10-CM | POA: Diagnosis not present

## 2022-02-28 DIAGNOSIS — F17200 Nicotine dependence, unspecified, uncomplicated: Secondary | ICD-10-CM | POA: Diagnosis not present

## 2022-02-28 DIAGNOSIS — R7989 Other specified abnormal findings of blood chemistry: Secondary | ICD-10-CM | POA: Diagnosis not present

## 2022-02-28 DIAGNOSIS — N1831 Chronic kidney disease, stage 3a: Secondary | ICD-10-CM

## 2022-02-28 LAB — CBC
HCT: 47.2 % — ABNORMAL HIGH (ref 36.0–46.0)
Hemoglobin: 15.1 g/dL — ABNORMAL HIGH (ref 12.0–15.0)
MCH: 30.5 pg (ref 26.0–34.0)
MCHC: 32 g/dL (ref 30.0–36.0)
MCV: 95.4 fL (ref 80.0–100.0)
Platelets: 243 10*3/uL (ref 150–400)
RBC: 4.95 MIL/uL (ref 3.87–5.11)
RDW: 13.2 % (ref 11.5–15.5)
WBC: 18.8 10*3/uL — ABNORMAL HIGH (ref 4.0–10.5)
nRBC: 0 % (ref 0.0–0.2)

## 2022-02-28 LAB — GLUCOSE, CAPILLARY
Glucose-Capillary: 202 mg/dL — ABNORMAL HIGH (ref 70–99)
Glucose-Capillary: 144 mg/dL — ABNORMAL HIGH (ref 70–99)
Glucose-Capillary: 162 mg/dL — ABNORMAL HIGH (ref 70–99)
Glucose-Capillary: 159 mg/dL — ABNORMAL HIGH (ref 70–99)
Glucose-Capillary: 115 mg/dL — ABNORMAL HIGH (ref 70–99)

## 2022-02-28 LAB — BASIC METABOLIC PANEL
Anion gap: 7 (ref 5–15)
BUN: 33 mg/dL — ABNORMAL HIGH (ref 8–23)
CO2: 32 mmol/L (ref 22–32)
Calcium: 9.5 mg/dL (ref 8.9–10.3)
Chloride: 105 mmol/L (ref 98–111)
Creatinine, Ser: 1.07 mg/dL — ABNORMAL HIGH (ref 0.44–1.00)
GFR, Estimated: 57 mL/min — ABNORMAL LOW (ref >60)
Glucose, Bld: 129 mg/dL — ABNORMAL HIGH (ref 70–99)
Potassium: 3.6 mmol/L (ref 3.5–5.1)
Sodium: 144 mmol/L (ref 135–145)

## 2022-02-28 LAB — MAGNESIUM: Magnesium: 2 mg/dL (ref 1.7–2.4)

## 2022-02-28 LAB — HEPARIN LEVEL (UNFRACTIONATED): Heparin Unfractionated: 0.41 IU/mL (ref 0.30–0.70)

## 2022-02-28 MED ORDER — MAGNESIUM SULFATE 2 GM/50ML IV SOLN
2.0000 g | Freq: Once | INTRAVENOUS | Status: AC
  Administered 2022-02-28: 2 g via INTRAVENOUS
  Filled 2022-02-28: qty 50

## 2022-02-28 MED ORDER — LOSARTAN POTASSIUM 50 MG PO TABS
50.0000 mg | ORAL_TABLET | Freq: Once | ORAL | Status: AC
  Administered 2022-02-28: 50 mg via ORAL
  Filled 2022-02-28: qty 1

## 2022-02-28 MED ORDER — IPRATROPIUM-ALBUTEROL 0.5-2.5 (3) MG/3ML IN SOLN
3.0000 mL | RESPIRATORY_TRACT | Status: DC
  Administered 2022-02-28 (×3): 3 mL via RESPIRATORY_TRACT
  Filled 2022-02-28 (×3): qty 3

## 2022-02-28 MED ORDER — IPRATROPIUM-ALBUTEROL 0.5-2.5 (3) MG/3ML IN SOLN
3.0000 mL | Freq: Four times a day (QID) | RESPIRATORY_TRACT | Status: DC
  Administered 2022-03-01 (×2): 3 mL via RESPIRATORY_TRACT
  Filled 2022-02-28 (×3): qty 3

## 2022-02-28 MED ORDER — METHYLPREDNISOLONE SODIUM SUCC 40 MG IJ SOLR
40.0000 mg | Freq: Once | INTRAMUSCULAR | Status: AC
  Administered 2022-02-28: 40 mg via INTRAVENOUS
  Filled 2022-02-28: qty 1

## 2022-02-28 MED ORDER — FUROSEMIDE 10 MG/ML IJ SOLN: 40.0000 mg | Freq: Once | INTRAMUSCULAR | Status: DC

## 2022-02-28 MED ORDER — LOSARTAN POTASSIUM 50 MG PO TABS
100.0000 mg | ORAL_TABLET | Freq: Every day | ORAL | Status: DC
  Administered 2022-03-01 – 2022-03-05 (×5): 100 mg via ORAL
  Filled 2022-02-28 (×5): qty 2

## 2022-02-28 MED ORDER — HYDRALAZINE HCL 20 MG/ML IJ SOLN
5.0000 mg | Freq: Once | INTRAMUSCULAR | Status: AC
  Administered 2022-02-28: 5 mg via INTRAVENOUS
  Filled 2022-02-28: qty 1

## 2022-02-28 MED ORDER — NITROGLYCERIN 2 % TD OINT
0.5000 [in_us] | TOPICAL_OINTMENT | Freq: Once | TRANSDERMAL | Status: AC
  Administered 2022-02-28: 0.5 [in_us] via TOPICAL
  Filled 2022-02-28: qty 1

## 2022-02-28 MED ORDER — METHYLPREDNISOLONE SODIUM SUCC 125 MG IJ SOLR
80.0000 mg | INTRAMUSCULAR | Status: DC
  Administered 2022-02-28 – 2022-03-04 (×5): 80 mg via INTRAVENOUS
  Filled 2022-02-28 (×5): qty 2

## 2022-02-28 MED ORDER — FUROSEMIDE 10 MG/ML IJ SOLN
40.0000 mg | INTRAMUSCULAR | Status: AC
  Administered 2022-02-28: 40 mg via INTRAVENOUS
  Filled 2022-02-28: qty 4

## 2022-02-28 MED ORDER — OXYCODONE HCL 5 MG PO TABS: 5.0000 mg | ORAL_TABLET | Freq: Four times a day (QID) | ORAL | Status: DC | PRN

## 2022-02-28 MED ORDER — HYDRALAZINE HCL 20 MG/ML IJ SOLN
20.0000 mg | Freq: Four times a day (QID) | INTRAMUSCULAR | Status: DC | PRN
  Administered 2022-03-01 – 2022-03-04 (×8): 20 mg via INTRAVENOUS
  Filled 2022-02-28 (×9): qty 1

## 2022-02-28 MED ORDER — MORPHINE SULFATE (PF) 2 MG/ML IV SOLN
1.0000 mg | INTRAVENOUS | Status: DC | PRN
  Administered 2022-02-28: 1 mg via INTRAVENOUS
  Filled 2022-02-28: qty 1

## 2022-02-28 NOTE — Progress Notes (Signed)
   02/28/22 0650  Assess: MEWS Score  Temp 98.5 F (36.9 C)  BP (!) 202/78  Pulse Rate 78  Resp (!) 30  Level of Consciousness Alert  SpO2 98 %  O2 Device Nasal Cannula  O2 Flow Rate (L/min) 6 L/min  Assess: MEWS Score  MEWS Temp 0  MEWS Systolic 2  MEWS Pulse 0  MEWS RR 2  MEWS LOC 0  MEWS Score 4  MEWS Score Color Red  Assess: if the MEWS score is Yellow or Red  Were vital signs taken at a resting state? Yes  Focused Assessment No change from prior assessment  Does the patient meet 2 or more of the SIRS criteria? Yes  Does the patient have a confirmed or suspected source of infection? Yes  Provider and Rapid Response Notified? Yes  MEWS guidelines implemented *See Row Information* Yes (previously yellow, increase VS since RED MEWS)  Treat  MEWS Interventions Escalated (See documentation below) (notified hospitalist on call)  Take Vital Signs  Increase Vital Sign Frequency  Red: Q 1hr X 4 then Q 4hr X 4, if remains red, continue Q 4hrs  Escalate  MEWS: Escalate Red: discuss with charge nurse/RN and provider, consider discussing with RRT  Notify: Charge Nurse/RN  Name of Charge Nurse/RN Notified Lora Paula, RN  Date Charge Nurse/RN Notified 02/28/22  Time Charge Nurse/RN Notified [id number]  Notify: Provider  Provider Name/Title Neomia Glass, NP  Date Provider Notified 02/28/22  Time Provider Notified [phone]  Method of Notification Page  Notification Reason Change in status  Assess: SIRS CRITERIA  SIRS Temperature  0  SIRS Pulse 0  SIRS Respirations  1  SIRS WBC 1  SIRS Score Sum  2

## 2022-02-28 NOTE — Progress Notes (Signed)
PHARMACIST - PHYSICIAN COMMUNICATION   CONCERNING: Methylprednisolone IV    Current order: Methylprednisolone IV 40 mg q12h     DESCRIPTION: Per [place] Protocol:   IV methylprednisolone will be converted to either a q12h or q24h frequency with the same total daily dose (TDD).  Ordered Dose: 1 to 125 mg TDD; convert to: TDD q24h.  Ordered Dose: 126 to 250 mg TDD; convert to: TDD div q12h.  Ordered Dose: >250 mg TDD; DAW.  Order has been adjusted to: Methylprednisolone IV 80mg  q24h   Noralee Space , PharmD Clinical Pharmacist  02/28/2022 7:48 AM

## 2022-02-28 NOTE — Progress Notes (Signed)
   02/28/22 0356  Assess: MEWS Score  Temp 97.8 F (36.6 C)  BP (!) 195/55  MAP (mmHg) 97  Pulse Rate 71  Resp (!) 32  Level of Consciousness Alert  SpO2 96 %  O2 Device Nasal Cannula  O2 Flow Rate (L/min) 6 L/min  Assess: MEWS Score  MEWS Temp 0  MEWS Systolic 0  MEWS Pulse 0  MEWS RR 2  MEWS LOC 0  MEWS Score 2  MEWS Score Color Yellow  Assess: if the MEWS score is Yellow or Red  Were vital signs taken at a resting state? Yes  Focused Assessment Change from prior assessment (see assessment flowsheet)  Does the patient meet 2 or more of the SIRS criteria? Yes  Does the patient have a confirmed or suspected source of infection? Yes  Provider and Rapid Response Notified? (S)  No (PROVIDER ONLY)  MEWS guidelines implemented *See Row Information* Yes  Treat  MEWS Interventions Administered prn meds/treatments;Escalated (See documentation below)  Pain Scale 0-10  Pain Score 0  Take Vital Signs  Increase Vital Sign Frequency  Yellow: Q 2hr X 2 then Q 4hr X 2, if remains yellow, continue Q 4hrs  Escalate  MEWS: Escalate Yellow: discuss with charge nurse/RN and consider discussing with provider and RRT  Notify: Charge Nurse/RN  Name of Charge Nurse/RN Notified Lora Paula, RN  Date Charge Nurse/RN Notified 02/28/22  Time Charge Nurse/RN Notified 2951  Notify: Provider  Provider Name/Title Neomia Glass, NP  Date Provider Notified 02/28/22  Time Provider Notified 0401  Method of Notification Page  Notification Reason Change in status  Provider response No new orders (PRN TX GIVEN)  Date of Provider Response 02/28/22  Time of Provider Response 0403  Assess: SIRS CRITERIA  SIRS Temperature  0  SIRS Pulse 0  SIRS Respirations  1  SIRS WBC 1  SIRS Score Sum  2

## 2022-02-28 NOTE — Consult Note (Signed)
[place] for IV Heparin Indication: chest pain/ACS  No Known Allergies  Patient Measurements: Height: 5\' 3"  (160 cm) Weight: 72.6 kg (160 lb) IBW/kg (Calculated) : 52.4 Heparin Dosing Weight: 67.6 kg  Vital Signs: Temp: 97.8 F (36.6 C) (10/05 0356) Temp Source: Oral (10/05 0356) BP: 177/77 (10/05 0505) Pulse Rate: 77 (10/05 0505)  Labs: Recent Labs    02/26/22 1225 02/26/22 1225 02/26/22 1423 02/26/22 1630 02/26/22 2255 02/27/22 0432 02/27/22 0801 02/27/22 1815 02/28/22 0456  HGB 15.4*  --   --   --   --  14.4  --   --  15.1*  HCT 47.8*  --   --   --   --  43.4  --   --  47.2*  PLT 246  --   --   --   --  189  --   --  243  APTT  --   --   --  35  --   --   --   --   --   LABPROT  --   --   --  14.2  --   --   --   --   --   INR  --   --   --  1.1  --   --   --   --   --   HEPARINUNFRC  --    < >  --   --  <0.10*  --  0.45 0.39 0.41  CREATININE 1.40*  --   --   --   --  1.04*  --   --  1.07*  TROPONINIHS 68*  --  114*  --  59* 43*  --   --   --    < > = values in this interval not displayed.     Estimated Creatinine Clearance: 49.4 mL/min (A) (by C-G formula based on SCr of 1.07 mg/dL (H)).   Medical History: Past Medical History:  Diagnosis Date   Erythrocytosis    Hyperlipidemia    Hypertension    Kidney damage    Type 2 diabetes mellitus (HCC)    Assessment: 66yo female presents with respiratory distress. Elevated troponin in ED.  Pharmacy consulted to initiate and monitor heparin infusion.No chronic anticoagulation identifiable on medication review  Baseline aPTT and INR are wnl.  Baseline CBC acceptable.  10/4 @1815  HL= 0.39  therapeuticx2 10/5 0456 HL 0.41, therapeutic x 3  Goal of Therapy:  Heparin level 0.3-0.7 units/ml Monitor platelets by anticoagulation protocol: Yes  Plan:  Continue heparin infusion at 1000 units/hr Recheck HL daily w/ AM labs while therapeutic Check daily CBC  Renda Rolls, PharmD, [facility] 02/28/2022 6:14 AM

## 2022-02-28 NOTE — Care Management Important Message (Signed)
Important Message  Patient Details  Name: Helen Benson MRN: [id number] Date of Birth: 01/02/1956   Medicare Important Message Given:  N/A - LOS <3 / Initial given by admissions     Dannette Barbara 02/28/2022, 12:55 PM

## 2022-02-28 NOTE — Progress Notes (Signed)
Patient's PRN nebulizer complete. Patient placed back on 6L O2 via nasal cannula. Patient states that she "feels better" but requesting something for anxiety. Hospitalist contacted for one time order for anxiolytic.

## 2022-02-28 NOTE — Progress Notes (Addendum)
Pt c/o "feeling like I can't breathe." This RN into patient's room at this time. Patient seen sitting upright in bed, shallow respirations 32 per minute. Patient currently on 4L O2 via nasal cannula, sats 92%. Increased to 6L O2 via nasal cannula, sats at 96%. Patient also with accessory muscle use with respirations. Administered PRN Xopenex nebulizer and hospitalist made aware.

## 2022-02-28 NOTE — Progress Notes (Signed)
Progress Note  Patient Name: Helen Benson Date of Encounter: 02/28/2022  Primary Cardiologist: New - Consult by End  Subjective   No chest pain.  Dyspnea improving.  Continues to require significant supplemental oxygen, currently at 7 L.  Echo with preserved LV systolic function and normal wall motion.  Inpatient Medications    Scheduled Meds:  aspirin EC  81 mg Oral Daily   atenolol  25 mg Oral Daily   chlorthalidone  25 mg Oral Daily   dapagliflozin propanediol  10 mg Oral Daily   insulin aspart  0-15 Units Subcutaneous TID WC   insulin aspart  0-5 Units Subcutaneous QHS   ipratropium-albuterol  3 mL Nebulization Q4H   levothyroxine  75 mcg Oral QAC breakfast   losartan  50 mg Oral Daily   methylPREDNISolone (SOLU-MEDROL) injection  80 mg Intravenous Q24H   oxyCODONE  10 mg Oral TID   rosuvastatin  20 mg Oral QHS   Continuous Infusions:  azithromycin 500 mg (02/27/22 2114)   heparin 1,000 Units/hr (02/27/22 1800)   PRN Meds: acetaminophen **OR** acetaminophen, ALPRAZolam, hydrALAZINE, levalbuterol, nicotine, ondansetron **OR** ondansetron (ZOFRAN) IV, mouth rinse, senna-docusate   Vital Signs    Vitals:   02/28/22 0854 02/28/22 0935 02/28/22 0953 02/28/22 1023  BP: (!) 156/85  (!) 151/69   Pulse: 75  (!) 59   Resp: (!) 25  20   Temp: 97.8 F (36.6 C)  97.8 F (36.6 C)   TempSrc: Oral     SpO2: 96% 97% 98% 95%  Weight:      Height:        Intake/Output Summary (Last 24 hours) at 02/28/2022 1056 Last data filed at 02/27/2022 1800 Gross per 24 hour  Intake 971.82 ml  Output 850 ml  Net 121.82 ml   Filed Weights   02/26/22 1222  Weight: 72.6 kg    Telemetry    SR - Personally Reviewed  ECG    No new tracings - Personally Reviewed  Physical Exam   GEN: No acute distress.   Neck: No JVD. Cardiac: RRR, no murmurs, rubs, or gallops.  Respiratory: Diminished breath sounds bilaterally bilaterally.  GI: Soft, nontender, non-distended.   MS: No  edema; No deformity. Neuro:  Alert and oriented x 3; Nonfocal.  Psych: Normal affect.  Labs    Chemistry Recent Labs  Lab 02/26/22 1225 02/27/22 0432 02/28/22 0456  NA 136 140 144  K 3.5 2.8* 3.6  CL 96* 104 105  CO2 28 30 32  GLUCOSE 140* 177* 129*  BUN 33* 32* 33*  CREATININE 1.40* 1.04* 1.07*  CALCIUM 9.7 9.3 9.5  PROT 7.8  --   --   ALBUMIN 4.2  --   --   AST 30  --   --   ALT 19  --   --   ALKPHOS 57  --   --   BILITOT 1.3*  --   --   GFRNONAA 41* 59* 57*  ANIONGAP 12 6 7      Hematology Recent Labs  Lab 02/26/22 1225 02/27/22 0432 02/28/22 0456  WBC 14.7* 7.9 18.8*  RBC 5.06 4.65 4.95  HGB 15.4* 14.4 15.1*  HCT 47.8* 43.4 47.2*  MCV 94.5 93.3 95.4  MCH 30.4 31.0 30.5  MCHC 32.2 33.2 32.0  RDW 13.1 13.0 13.2  PLT 246 189 243    Cardiac EnzymesNo results for input(s): "TROPONINI" in the last 168 hours. No results for input(s): "TROPIPOC" in the last 168 hours.  BNP Recent Labs  Lab 02/26/22 1225  BNP 255.2*     DDimer No results for input(s): "DDIMER" in the last 168 hours.   Radiology    DG Chest 1 View  Result Date: 02/28/2022 IMPRESSION: 1. Diffuse interstitial prominence and peribronchial cuffing, increased slightly compared to the prior study, concerning for an acute bronchitis. 2. Aortic atherosclerosis. Electronically Signed   By: Vinnie Langton M.D.   On: 02/28/2022 06:05    CT Angio Chest Pulmonary Embolism (PE) W or WO Contrast  Result Date: 02/26/2022 IMPRESSION: No definite evidence of pulmonary embolus. Bilateral pulmonary nodules are noted, the largest measuring 6 mm in the left lower lobe. Non-contrast chest CT at 3-6 months is recommended. If the nodules are stable at time of repeat CT, then future CT at 18-24 months (from today's scan) is considered optional for low-risk patients, but is recommended for high-risk patients. This recommendation follows the consensus statement: Guidelines for Management of Incidental Pulmonary  Nodules Detected on CT Images: From the Fleischner Society 2017; Radiology 2017; 284:228-243. Electronically Signed   By: Marijo Conception M.D.   On: 02/26/2022 15:43   DG Chest 1 View  Result Date: 02/26/2022 IMPRESSION: No active disease. Electronically Signed   By: Elmer Picker M.D.   On: 02/26/2022 12:52    Cardiac Studies   2D echo 02/27/2022: 1. Left ventricular ejection fraction, by estimation, is 70 to 75%. The  left ventricle has hyperdynamic function. The left ventricle has no  regional wall motion abnormalities. Left ventricular diastolic parameters  are consistent with Grade II diastolic  dysfunction (pseudonormalization).   2. Right ventricular systolic function is normal. The right ventricular  size is normal. There is normal pulmonary artery systolic pressure.   3. The mitral valve is degenerative. No evidence of mitral valve  regurgitation. Severe mitral annular calcification.   4. The aortic valve was not well visualized. Aortic valve regurgitation  is not visualized.   5. The inferior vena cava is normal in size with <50% respiratory  variability, suggesting right atrial pressure of 8 mmHg.  Patient Profile     66 y.o. female with history of DM2, HTN, HLD, and tobacco use who we are evaluating for elevated high-sensitivity troponin.  Assessment & Plan    1.  Acute hypoxic respiratory failure: -Secondary to acute bronchitis with rhinovirus/enterovirus -Management per primary service  2.  Coronary calcification with elevated high-sensitivity troponin: -Mildly elevated, peaking at 114 -Consistent with supply/demand ischemia in the setting of acute hypoxic respiratory failure and underlying CKD -Echo with preserved LV systolic function and normal wall motion -Consider outpatient ischemic evaluation once respiratory status has improved, no plans for inpatient ischemic evaluation -Can discontinue IV heparin once she has completed 48 hours (this afternoon) -ASA,  rosuvastatin -LDL 39  3.  AKI: -Renal function improving  4.  Hypokalemia: -Improving  5.  HTN: -Blood pressure remains suboptimally controlled -Could transition atenolol to carvedilol for added BP effect -Titrate losartan to 100 mg daily -She remains on chlorthalidone      For questions or updates, please contact [place] Please consult [email or web address] for contact info under Cardiology/STEMI.    Signed, Christell Faith, PA-C [place] Pager: [phone] 02/28/2022, 10:56 AM

## 2022-02-28 NOTE — Progress Notes (Signed)
PROGRESS NOTE    Helen Benson  OXB:[id number] DOB: 12-01-1955 DOA: 02/26/2022 PCP: Theresia Lo, NP   Assessment & Plan:   Principal Problem:   Acute hypoxemic respiratory failure ([place]) Active Problems:   Anxiety   Diabetes 1.5, managed as type 2 ([place])   Primary hypertension   Smoker   Hypothyroidism   Stage 3a chronic kidney disease ([place])   Dyslipidemia   Elevated troponin  Assessment and Plan: Acute hypoxic respiratory failure: likely secondary to acute bronchitis found on repeat CXR & rhinovirus/enterovirus. Started on steroids overnight. 62% on RA on admission. Continue on supplemental oxygen and wean as tolerated.  CTA chest neg for PE but b/l pulmonary nodules noted. Will need repeat CT in 3-6 months. Continue on bronchodilators & encourage incentive spirometry   Elevated troponins: likely secondary to demand ischemia. D/c IV heparin this afternoon when 48 hrs is complete as per cardio   Hypokalemia: WNL today   HLD: continue on statin   CKDIIIa: Cr is labile. Avoid nephrotoxic meds  Hypothyroidism: continue on levothyroxine   Smoker: received smoking cessation counseling > 5 mins as per admitting physician. Pt declines nicotine patch   HTN: continue on losartan, atenolol, chlorthalidone.   DM2: likely poorly controlled. Continue on SSI w/ accuchecks  Anxiety: severity unknown. Continue on paroxetine, xanax         DVT prophylaxis: IV heparin  Code Status: full  Family Communication:  Disposition Plan: likely d/c back home   Level of care: Stepdown  Status is: Inpatient Remains inpatient appropriate because: severity of illness    Consultants:  cardio  Procedures:   Antimicrobials: azithromycin    Subjective: Pt c/o shortness of breath & cough  Objective: Vitals:   02/28/22 0356 02/28/22 0505 02/28/22 0645 02/28/22 0650  BP: (!) 195/55 (!) 177/77 (!) 187/63 (!) 202/78  Pulse: 71 77 80 78  Resp: (!) 32 (!) 24 (!) 30 (!) 30   Temp: 97.8 F (36.6 C)  98.5 F (36.9 C) 98.5 F (36.9 C)  TempSrc: Oral  Oral   SpO2: 96% 99% 96% 98%  Weight:      Height:        Intake/Output Summary (Last 24 hours) at 02/28/2022 0737 Last data filed at 02/27/2022 1800 Gross per 24 hour  Intake 1211.82 ml  Output 850 ml  Net 361.82 ml   Filed Weights   02/26/22 1222  Weight: 72.6 kg    Examination:  General exam: Appears calm but uncomfortable Respiratory system: course breath sounds b/l. Intermittent wheezes b/l  Cardiovascular system: S1/S2+. No rubs or clicks  Gastrointestinal system: Abd is soft, NT, obese & normal bowel sounds Central nervous system: Alert and oriented. Moves all extremities  Psychiatry: judgement and insight appears at baseline. Flat mood and affect     Data Reviewed: I have personally reviewed following labs and imaging studies  CBC: Recent Labs  Lab 02/26/22 1225 02/27/22 0432 02/28/22 0456  WBC 14.7* 7.9 18.8*  NEUTROABS 10.5*  --   --   HGB 15.4* 14.4 15.1*  HCT 47.8* 43.4 47.2*  MCV 94.5 93.3 95.4  PLT 246 189 683   Basic Metabolic Panel: Recent Labs  Lab 02/26/22 1225 02/27/22 0432 02/28/22 0456  NA 136 140 144  K 3.5 2.8* 3.6  CL 96* 104 105  CO2 28 30 32  GLUCOSE 140* 177* 129*  BUN 33* 32* 33*  CREATININE 1.40* 1.04* 1.07*  CALCIUM 9.7 9.3 9.5  MG  --   --  2.0   GFR: Estimated Creatinine Clearance: 49.4 mL/min (A) (by C-G formula based on SCr of 1.07 mg/dL (H)). Liver Function Tests: Recent Labs  Lab 02/26/22 1225  AST 30  ALT 19  ALKPHOS 57  BILITOT 1.3*  PROT 7.8  ALBUMIN 4.2   No results for input(s): "LIPASE", "AMYLASE" in the last 168 hours. No results for input(s): "AMMONIA" in the last 168 hours. Coagulation Profile: Recent Labs  Lab 02/26/22 1630  INR 1.1   Cardiac Enzymes: No results for input(s): "CKTOTAL", "CKMB", "CKMBINDEX", "TROPONINI" in the last 168 hours. BNP (last 3 results) No results for input(s): "PROBNP" in the last 8760  hours. HbA1C: No results for input(s): "HGBA1C" in the last 72 hours. CBG: Recent Labs  Lab 02/26/22 2323 02/27/22 0717 02/27/22 1157 02/27/22 1614 02/27/22 2015  GLUCAP 228* 152* 183* 134* 155*   Lipid Profile: Recent Labs    02/27/22 0432  CHOL 82  HDL 29*  LDLCALC 39  TRIG 69  CHOLHDL 2.8   Thyroid Function Tests: Recent Labs    02/26/22 1420  TSH 1.115   Anemia Panel: No results for input(s): "VITAMINB12", "FOLATE", "FERRITIN", "TIBC", "IRON", "RETICCTPCT" in the last 72 hours. Sepsis Labs: Recent Labs  Lab 02/26/22 1225 02/26/22 1425 02/26/22 2255 02/27/22 0002  LATICACIDVEN 2.0* 2.8* 2.0* 2.0*    Recent Results (from the past 240 hour(s))  Resp Panel by RT-PCR (Flu A&B, Covid) Anterior Nasal Swab     Status: None   Collection Time: 02/26/22 12:25 PM   Specimen: Anterior Nasal Swab  Result Value Ref Range Status   SARS Coronavirus 2 by RT PCR NEGATIVE NEGATIVE Final    Comment: (NOTE) SARS-CoV-2 target nucleic acids are NOT DETECTED.  The SARS-CoV-2 RNA is generally detectable in upper respiratory specimens during the acute phase of infection. The lowest concentration of SARS-CoV-2 viral copies this assay can detect is 138 copies/mL. A negative result does not preclude SARS-Cov-2 infection and should not be used as the sole basis for treatment or other patient management decisions. A negative result may occur with  improper specimen collection/handling, submission of specimen other than nasopharyngeal swab, presence of viral mutation(s) within the areas targeted by this assay, and inadequate number of viral copies(<138 copies/mL). A negative result must be combined with clinical observations, patient history, and epidemiological information. The expected result is Negative.  Fact Sheet for Patients:  EntrepreneurPulse.com.au  Fact Sheet for Healthcare Providers:  IncredibleEmployment.be  This test is no t yet  approved or cleared by the [place] FDA and  has been authorized for detection and/or diagnosis of SARS-CoV-2 by FDA under an Emergency Use Authorization (EUA). This EUA will remain  in effect (meaning this test can be used) for the duration of the COVID-19 declaration under Section 564(b)(1) of the Act, 21 U.S.C.section 360bbb-3(b)(1), unless the authorization is terminated  or revoked sooner.       Influenza A by PCR NEGATIVE NEGATIVE Final   Influenza B by PCR NEGATIVE NEGATIVE Final    Comment: (NOTE) The Xpert Xpress SARS-CoV-2/FLU/RSV plus assay is intended as an aid in the diagnosis of influenza from Nasopharyngeal swab specimens and should not be used as a sole basis for treatment. Nasal washings and aspirates are unacceptable for Xpert Xpress SARS-CoV-2/FLU/RSV testing.  Fact Sheet for Patients: EntrepreneurPulse.com.au  Fact Sheet for Healthcare Providers: IncredibleEmployment.be  This test is not yet approved or cleared by the [place] FDA and has been authorized for detection and/or diagnosis of SARS-CoV-2 by FDA  under an Emergency Use Authorization (EUA). This EUA will remain in effect (meaning this test can be used) for the duration of the COVID-19 declaration under Section 564(b)(1) of the Act, 21 U.S.C. section 360bbb-3(b)(1), unless the authorization is terminated or revoked.  Performed at [facility], [place], [place] [address]   Respiratory (~20 pathogens) panel by PCR     Status: Abnormal   Collection Time: 02/26/22  4:07 PM   Specimen: Nasopharyngeal Swab; Respiratory  Result Value Ref Range Status   Adenovirus NOT DETECTED NOT DETECTED Final   Coronavirus 229E NOT DETECTED NOT DETECTED Final    Comment: (NOTE) The Coronavirus on the Respiratory Panel, DOES NOT test for the novel  Coronavirus (2019 nCoV)    Coronavirus HKU1 NOT DETECTED NOT DETECTED Final   Coronavirus NL63 NOT  DETECTED NOT DETECTED Final   Coronavirus OC43 NOT DETECTED NOT DETECTED Final   Metapneumovirus NOT DETECTED NOT DETECTED Final   Rhinovirus / Enterovirus DETECTED (A) NOT DETECTED Final   Influenza A NOT DETECTED NOT DETECTED Final   Influenza B NOT DETECTED NOT DETECTED Final   Parainfluenza Virus 1 NOT DETECTED NOT DETECTED Final   Parainfluenza Virus 2 NOT DETECTED NOT DETECTED Final   Parainfluenza Virus 3 NOT DETECTED NOT DETECTED Final   Parainfluenza Virus 4 NOT DETECTED NOT DETECTED Final   Respiratory Syncytial Virus NOT DETECTED NOT DETECTED Final   Bordetella pertussis NOT DETECTED NOT DETECTED Final   Bordetella Parapertussis NOT DETECTED NOT DETECTED Final   Chlamydophila pneumoniae NOT DETECTED NOT DETECTED Final   Mycoplasma pneumoniae NOT DETECTED NOT DETECTED Final    Comment: Performed at [facility], [place]. [address] [place], [place] [address]         Radiology Studies: DG Chest 1 View  Result Date: 02/28/2022 CLINICAL DATA:  66 year old female with history of dyspnea. EXAM: CHEST  1 VIEW COMPARISON:  Chest x-ray 02/26/2022. FINDINGS: Lung volumes are normal. Mild diffuse interstitial prominence and peribronchial cuffing, increased compared to the prior study. No consolidative airspace disease. No pleural effusions. No pneumothorax. No pulmonary nodule or mass noted. Pulmonary vasculature and the cardiomediastinal silhouette are within normal limits. Atherosclerosis in the thoracic aorta. IMPRESSION: 1. Diffuse interstitial prominence and peribronchial cuffing, increased slightly compared to the prior study, concerning for an acute bronchitis. 2. Aortic atherosclerosis. Electronically Signed   By: Vinnie Langton M.D.   On: 02/28/2022 06:05   ECHOCARDIOGRAM COMPLETE  Result Date: 02/27/2022    ECHOCARDIOGRAM REPORT   Patient Name:   Helen Benson [facility] Date of Exam: 02/27/2022 Medical Rec #:  [id number]       Height:       63.0 in Accession #:    [id number]       Weight:       160.0 lb Date of Birth:  March 23, 1956       BSA:          1.759 m Patient Age:    66 years        BP:           107/57 mmHg Patient Gender: F               HR:           61 bpm. Exam Location:  [facility] Procedure: 2D Echo, Color Doppler and Cardiac Doppler Indications:     Elevated Troponin  History:         Patient has no prior history of Echocardiogram  examinations.                  Risk Factors:Dyslipidemia, Hypertension and Diabetes.  Sonographer:     Sherrie Sport Referring Phys:  [id number] AMY N COX Diagnosing Phys: Kate Sable MD  Sonographer Comments: Suboptimal parasternal window. IMPRESSIONS  1. Left ventricular ejection fraction, by estimation, is 70 to 75%. The left ventricle has hyperdynamic function. The left ventricle has no regional wall motion abnormalities. Left ventricular diastolic parameters are consistent with Grade II diastolic dysfunction (pseudonormalization).  2. Right ventricular systolic function is normal. The right ventricular size is normal. There is normal pulmonary artery systolic pressure.  3. The mitral valve is degenerative. No evidence of mitral valve regurgitation. Severe mitral annular calcification.  4. The aortic valve was not well visualized. Aortic valve regurgitation is not visualized.  5. The inferior vena cava is normal in size with <50% respiratory variability, suggesting right atrial pressure of 8 mmHg. FINDINGS  Left Ventricle: Left ventricular ejection fraction, by estimation, is 70 to 75%. The left ventricle has hyperdynamic function. The left ventricle has no regional wall motion abnormalities. The left ventricular internal cavity size was normal in size. There is no left ventricular hypertrophy. Left ventricular diastolic parameters are consistent with Grade II diastolic dysfunction (pseudonormalization). Right Ventricle: The right ventricular size is normal. No increase in right ventricular wall thickness. Right ventricular systolic function is normal.  There is normal pulmonary artery systolic pressure. The tricuspid regurgitant velocity is 2.21 m/s, and  with an assumed right atrial pressure of 8 mmHg, the estimated right ventricular systolic pressure is 09.8 mmHg. Left Atrium: Left atrial size was normal in size. Right Atrium: Right atrial size was normal in size. Pericardium: There is no evidence of pericardial effusion. Mitral Valve: The mitral valve is degenerative in appearance. Severe mitral annular calcification. No evidence of mitral valve regurgitation. MV peak gradient, 9.5 mmHg. The mean mitral valve gradient is 5.0 mmHg. Tricuspid Valve: The tricuspid valve is not well visualized. Tricuspid valve regurgitation is not demonstrated. Aortic Valve: The aortic valve was not well visualized. Aortic valve regurgitation is not visualized. Aortic valve mean gradient measures 8.5 mmHg. Aortic valve peak gradient measures 15.8 mmHg. Aortic valve area, by VTI measures 2.63 cm. Pulmonic Valve: The pulmonic valve was not well visualized. Pulmonic valve regurgitation is not visualized. Aorta: The aortic root is normal in size and structure. Venous: The inferior vena cava is normal in size with less than 50% respiratory variability, suggesting right atrial pressure of 8 mmHg. IAS/Shunts: No atrial level shunt detected by color flow Doppler.  LEFT VENTRICLE PLAX 2D LVIDd:         3.40 cm   Diastology LVIDs:         2.00 cm   LV e' medial:    5.77 cm/s LV PW:         0.90 cm   LV E/e' medial:  27.6 LV IVS:        0.90 cm   LV e' lateral:   6.64 cm/s LVOT diam:     2.00 cm   LV E/e' lateral: 23.9 LV SV:         101 LV SV Index:   57 LVOT Area:     3.14 cm  RIGHT VENTRICLE RV Basal diam:  3.20 cm RV Mid diam:    2.50 cm RV S prime:     13.70 cm/s TAPSE (M-mode): 3.8 cm LEFT ATRIUM  Index        RIGHT ATRIUM           Index LA diam:      3.70 cm 2.10 cm/m   RA Area:     10.80 cm LA Vol (A2C): 24.3 ml 13.82 ml/m  RA Volume:   22.90 ml  13.02 ml/m LA Vol  (A4C): 32.1 ml 18.25 ml/m  AORTIC VALVE AV Area (Vmax):    1.98 cm AV Area (Vmean):   1.89 cm AV Area (VTI):     2.63 cm AV Vmax:           198.50 cm/s AV Vmean:          132.000 cm/s AV VTI:            0.382 m AV Peak Grad:      15.8 mmHg AV Mean Grad:      8.5 mmHg LVOT Vmax:         125.00 cm/s LVOT Vmean:        79.300 cm/s LVOT VTI:          0.320 m LVOT/AV VTI ratio: 0.84  AORTA Ao Root diam: 1.90 cm MITRAL VALVE                TRICUSPID VALVE MV Area (PHT): 2.93 cm     TR Peak grad:   19.5 mmHg MV Area VTI:   2.09 cm     TR Vmax:        221.00 cm/s MV Peak grad:  9.5 mmHg MV Mean grad:  5.0 mmHg     SHUNTS MV Vmax:       1.54 m/s     Systemic VTI:  0.32 m MV Vmean:      105.0 cm/s   Systemic Diam: 2.00 cm MV Decel Time: 259 msec MV E velocity: 159.00 cm/s MV A velocity: 113.00 cm/s MV E/A ratio:  1.41 Kate Sable MD Electronically signed by Kate Sable MD Signature Date/Time: 02/27/2022/11:50:56 AM    Final    CT Angio Chest Pulmonary Embolism (PE) W or WO Contrast  Result Date: 02/26/2022 CLINICAL DATA:  Respiratory distress. EXAM: CT ANGIOGRAPHY CHEST WITH CONTRAST TECHNIQUE: Multidetector CT imaging of the chest was performed using the standard protocol during bolus administration of intravenous contrast. Multiplanar CT image reconstructions and MIPs were obtained to evaluate the vascular anatomy. RADIATION DOSE REDUCTION: This exam was performed according to the departmental dose-optimization program which includes automated exposure control, adjustment of the mA and/or kV according to patient size and/or use of iterative reconstruction technique. CONTRAST:  66mL OMNIPAQUE IOHEXOL 350 MG/ML SOLN COMPARISON:  None Available. FINDINGS: Cardiovascular: Satisfactory opacification of the pulmonary arteries to the segmental level. No evidence of pulmonary embolism. Normal heart size. No pericardial effusion. Mediastinum/Nodes: No enlarged mediastinal, hilar, or axillary lymph nodes. Thyroid  gland, trachea, and esophagus demonstrate no significant findings. Lungs/Pleura: No pneumothorax or pleural effusion is noted. 3 mm nodule is noted anteriorly in right upper lobe best seen on image number 31 of series 6. 6 mm subpleural nodule is noted posteriorly in the left lower lobe best seen on image number 52 of series 6. Mild left basilar subsegmental atelectasis or scarring is noted. Upper Abdomen: No acute abnormality. Musculoskeletal: No chest wall abnormality. No acute or significant osseous findings. Review of the MIP images confirms the above findings. IMPRESSION: No definite evidence of pulmonary embolus. Bilateral pulmonary nodules are noted, the largest measuring 6 mm in the left lower lobe.  Non-contrast chest CT at 3-6 months is recommended. If the nodules are stable at time of repeat CT, then future CT at 18-24 months (from today's scan) is considered optional for low-risk patients, but is recommended for high-risk patients. This recommendation follows the consensus statement: Guidelines for Management of Incidental Pulmonary Nodules Detected on CT Images: From the Fleischner Society 2017; Radiology 2017; 284:228-243. Electronically Signed   By: Marijo Conception M.D.   On: 02/26/2022 15:43   DG Chest 1 View  Result Date: 02/26/2022 CLINICAL DATA:  Shortness of breath EXAM: CHEST  1 VIEW COMPARISON:  10/23/2015 FINDINGS: Cardiac size is within normal limits. Lung fields are clear of any infiltrates or pulmonary edema. There is no pleural effusion or pneumothorax. Degenerative changes are noted in both shoulders. IMPRESSION: No active disease. Electronically Signed   By: Elmer Picker M.D.   On: 02/26/2022 12:52        Scheduled Meds:  aspirin EC  81 mg Oral Daily   atenolol  25 mg Oral Daily   chlorthalidone  25 mg Oral Daily   dapagliflozin propanediol  10 mg Oral Daily   insulin aspart  0-15 Units Subcutaneous TID WC   insulin aspart  0-5 Units Subcutaneous QHS    ipratropium-albuterol  3 mL Nebulization Q4H   levothyroxine  75 mcg Oral QAC breakfast   losartan  50 mg Oral Daily   nitroGLYCERIN  0.5 inch Topical Once   oxyCODONE  10 mg Oral TID   rosuvastatin  20 mg Oral QHS   Continuous Infusions:  azithromycin 500 mg (02/27/22 2114)   heparin 1,000 Units/hr (02/27/22 1800)   magnesium sulfate bolus IVPB       LOS: 2 days    Time spent: 36 mins    Wyvonnia Dusky, MD [facility] Pager [phone]  If 7PM-7AM, please contact night-coverage [email or web address] 02/28/2022, 7:37 AM

## 2022-03-01 DIAGNOSIS — J9601 Acute respiratory failure with hypoxia: Secondary | ICD-10-CM | POA: Diagnosis not present

## 2022-03-01 DIAGNOSIS — J206 Acute bronchitis due to rhinovirus: Secondary | ICD-10-CM

## 2022-03-01 DIAGNOSIS — E876 Hypokalemia: Secondary | ICD-10-CM | POA: Diagnosis not present

## 2022-03-01 LAB — GLUCOSE, CAPILLARY
Glucose-Capillary: 160 mg/dL — ABNORMAL HIGH (ref 70–99)
Glucose-Capillary: 132 mg/dL — ABNORMAL HIGH (ref 70–99)
Glucose-Capillary: 125 mg/dL — ABNORMAL HIGH (ref 70–99)
Glucose-Capillary: 126 mg/dL — ABNORMAL HIGH (ref 70–99)

## 2022-03-01 LAB — BASIC METABOLIC PANEL
Anion gap: 11 (ref 5–15)
BUN: 43 mg/dL — ABNORMAL HIGH (ref 8–23)
CO2: 35 mmol/L — ABNORMAL HIGH (ref 22–32)
Calcium: 10 mg/dL (ref 8.9–10.3)
Chloride: 98 mmol/L (ref 98–111)
Creatinine, Ser: 0.98 mg/dL (ref 0.44–1.00)
GFR, Estimated: >60 mL/min (ref >60)
Glucose, Bld: 165 mg/dL — ABNORMAL HIGH (ref 70–99)
Potassium: 3 mmol/L — ABNORMAL LOW (ref 3.5–5.1)
Sodium: 144 mmol/L (ref 135–145)

## 2022-03-01 LAB — CBC
HCT: 46.2 % — ABNORMAL HIGH (ref 36.0–46.0)
Hemoglobin: 14.6 g/dL (ref 12.0–15.0)
MCH: 30 pg (ref 26.0–34.0)
MCHC: 31.6 g/dL (ref 30.0–36.0)
MCV: 94.9 fL (ref 80.0–100.0)
Platelets: 240 10*3/uL (ref 150–400)
RBC: 4.87 MIL/uL (ref 3.87–5.11)
RDW: 13.2 % (ref 11.5–15.5)
WBC: 14.3 10*3/uL — ABNORMAL HIGH (ref 4.0–10.5)
nRBC: 0 % (ref 0.0–0.2)

## 2022-03-01 LAB — MAGNESIUM: Magnesium: 2.4 mg/dL (ref 1.7–2.4)

## 2022-03-01 MED ORDER — ENOXAPARIN SODIUM 40 MG/0.4ML IJ SOSY
40.0000 mg | PREFILLED_SYRINGE | INTRAMUSCULAR | Status: DC
  Administered 2022-03-01 – 2022-03-04 (×4): 40 mg via SUBCUTANEOUS
  Filled 2022-03-01 (×4): qty 0.4

## 2022-03-01 MED ORDER — GUAIFENESIN ER 600 MG PO TB12
600.0000 mg | ORAL_TABLET | Freq: Two times a day (BID) | ORAL | Status: DC
  Administered 2022-03-01 – 2022-03-05 (×9): 600 mg via ORAL
  Filled 2022-03-01 (×9): qty 1

## 2022-03-01 MED ORDER — IPRATROPIUM-ALBUTEROL 0.5-2.5 (3) MG/3ML IN SOLN
3.0000 mL | Freq: Four times a day (QID) | RESPIRATORY_TRACT | Status: DC | PRN
  Administered 2022-03-01: 3 mL via RESPIRATORY_TRACT

## 2022-03-01 MED ORDER — POTASSIUM CHLORIDE CRYS ER 20 MEQ PO TBCR
40.0000 meq | EXTENDED_RELEASE_TABLET | Freq: Two times a day (BID) | ORAL | Status: AC
  Administered 2022-03-01 (×2): 40 meq via ORAL
  Filled 2022-03-01 (×2): qty 2

## 2022-03-01 NOTE — Progress Notes (Signed)
PROGRESS NOTE    Helen Benson  CVE:[id number] DOB: Dec 01, 1955 DOA: 02/26/2022 PCP: Theresia Lo, NP   Assessment & Plan:   Principal Problem:   Acute hypoxemic respiratory failure ([place]) Active Problems:   Anxiety   Diabetes 1.5, managed as type 2 ([place])   Primary hypertension   Smoker   Hypothyroidism   Stage 3a chronic kidney disease ([place])   Dyslipidemia   Elevated troponin   Acute bronchitis due to Rhinovirus  Assessment and Plan: Acute hypoxic respiratory failure: likely secondary to acute bronchitis found on repeat CXR & rhinovirus/enterovirus. Continue on steroids. 62% on RA on admission. Continue on supplemental oxygen and wean as tolerated. CTA chest neg for PE but b/l pulmonary nodules noted. Will need repeat CT in 3-6 months. Continue on bronchodilators & encourage incentive spirometry   Elevated troponins: likely secondary to demand ischemia. D/c heparin. No further inpatient cardiac work-up as per cardio. F/u outpatient w/ cardio, Dr. Saunders Revel, in 2 weeks. Cardio signed off   Hypokalemia: potassium given. Mg is WNL   HLD:  continue on statin   CKDIIIa: Cr is trending down today   Hypothyroidism: continue on levothyroxine   Smoker: received smoking cessation counseling > 5 mins as per admitting physician. Pt declines nicotine patch   HTN: continue on chlorthalidone, losartan & atenolol   DM2:  well controlled, HbA1c 6.1 in 12/2021. Continue on SSI w/ accuchecks   Anxiety: severity unknown. Continue on xanax, paroxetine         DVT prophylaxis: lovenox  Code Status: full  Family Communication:  Disposition Plan: likely d/c back home   Level of care: Progressive  Status is: Inpatient Remains inpatient appropriate because: severity of illness, likely d/c home tomorrow. Still weaning oxygen    Consultants:  cardio  Procedures:   Antimicrobials: azithromycin    Subjective: Pt c/o productive cough but has difficulty coughing up mucus.    Objective: Vitals:   02/28/22 2311 03/01/22 0113 03/01/22 0406 03/01/22 0809  BP: (!) 143/68  (!) 173/67 (!) 155/58  Pulse: 63  64 63  Resp: 18  18 19   Temp: 97.6 F (36.4 C)  97.8 F (36.6 C) 98.1 F (36.7 C)  TempSrc: Oral  Oral   SpO2: 98% 95% 97% 96%  Weight:      Height:       No intake or output data in the 24 hours ending 03/01/22 0824  Filed Weights   02/26/22 1222  Weight: 72.6 kg    Examination:  General exam: Appears comfortable  Respiratory system: course breath sounds b/l  Cardiovascular system: S1 & S2+. No rubs or clicks  Gastrointestinal system: Abd is soft, NT & normal bowel sounds  Central nervous system: alert and oriented.moves all extremities  Psychiatry:  Judgement and insight appear normal. Flat mood and affect    Data Reviewed: I have personally reviewed following labs and imaging studies  CBC: Recent Labs  Lab 02/26/22 1225 02/27/22 0432 02/28/22 0456 03/01/22 0422  WBC 14.7* 7.9 18.8* 14.3*  NEUTROABS 10.5*  --   --   --   HGB 15.4* 14.4 15.1* 14.6  HCT 47.8* 43.4 47.2* 46.2*  MCV 94.5 93.3 95.4 94.9  PLT 246 189 243 025   Basic Metabolic Panel: Recent Labs  Lab 02/26/22 1225 02/27/22 0432 02/28/22 0456 03/01/22 0422  NA 136 140 144 144  K 3.5 2.8* 3.6 3.0*  CL 96* 104 105 98  CO2 28 30 32 35*  GLUCOSE 140* 177* 129* 165*  BUN 33* 32* 33* 43*  CREATININE 1.40* 1.04* 1.07* 0.98  CALCIUM 9.7 9.3 9.5 10.0  MG  --   --  2.0 2.4   GFR: Estimated Creatinine Clearance: 53.9 mL/min (by C-G formula based on SCr of 0.98 mg/dL). Liver Function Tests: Recent Labs  Lab 02/26/22 1225  AST 30  ALT 19  ALKPHOS 57  BILITOT 1.3*  PROT 7.8  ALBUMIN 4.2   No results for input(s): "LIPASE", "AMYLASE" in the last 168 hours. No results for input(s): "AMMONIA" in the last 168 hours. Coagulation Profile: Recent Labs  Lab 02/26/22 1630  INR 1.1   Cardiac Enzymes: No results for input(s): "CKTOTAL", "CKMB", "CKMBINDEX",  "TROPONINI" in the last 168 hours. BNP (last 3 results) No results for input(s): "PROBNP" in the last 8760 hours. HbA1C: No results for input(s): "HGBA1C" in the last 72 hours. CBG: Recent Labs  Lab 02/28/22 1144 02/28/22 1252 02/28/22 1624 02/28/22 2007 03/01/22 0807  GLUCAP 144* 162* 159* 115* 160*   Lipid Profile: Recent Labs    02/27/22 0432  CHOL 82  HDL 29*  LDLCALC 39  TRIG 69  CHOLHDL 2.8   Thyroid Function Tests: Recent Labs    02/26/22 1420  TSH 1.115   Anemia Panel: No results for input(s): "VITAMINB12", "FOLATE", "FERRITIN", "TIBC", "IRON", "RETICCTPCT" in the last 72 hours. Sepsis Labs: Recent Labs  Lab 02/26/22 1225 02/26/22 1425 02/26/22 2255 02/27/22 0002  LATICACIDVEN 2.0* 2.8* 2.0* 2.0*    Recent Results (from the past 240 hour(s))  Resp Panel by RT-PCR (Flu A&B, Covid) Anterior Nasal Swab     Status: None   Collection Time: 02/26/22 12:25 PM   Specimen: Anterior Nasal Swab  Result Value Ref Range Status   SARS Coronavirus 2 by RT PCR NEGATIVE NEGATIVE Final    Comment: (NOTE) SARS-CoV-2 target nucleic acids are NOT DETECTED.  The SARS-CoV-2 RNA is generally detectable in upper respiratory specimens during the acute phase of infection. The lowest concentration of SARS-CoV-2 viral copies this assay can detect is 138 copies/mL. A negative result does not preclude SARS-Cov-2 infection and should not be used as the sole basis for treatment or other patient management decisions. A negative result may occur with  improper specimen collection/handling, submission of specimen other than nasopharyngeal swab, presence of viral mutation(s) within the areas targeted by this assay, and inadequate number of viral copies(<138 copies/mL). A negative result must be combined with clinical observations, patient history, and epidemiological information. The expected result is Negative.  Fact Sheet for Patients:   EntrepreneurPulse.com.au  Fact Sheet for Healthcare Providers:  IncredibleEmployment.be  This test is no t yet approved or cleared by the [place] FDA and  has been authorized for detection and/or diagnosis of SARS-CoV-2 by FDA under an Emergency Use Authorization (EUA). This EUA will remain  in effect (meaning this test can be used) for the duration of the COVID-19 declaration under Section 564(b)(1) of the Act, 21 U.S.C.section 360bbb-3(b)(1), unless the authorization is terminated  or revoked sooner.       Influenza A by PCR NEGATIVE NEGATIVE Final   Influenza B by PCR NEGATIVE NEGATIVE Final    Comment: (NOTE) The Xpert Xpress SARS-CoV-2/FLU/RSV plus assay is intended as an aid in the diagnosis of influenza from Nasopharyngeal swab specimens and should not be used as a sole basis for treatment. Nasal washings and aspirates are unacceptable for Xpert Xpress SARS-CoV-2/FLU/RSV testing.  Fact Sheet for Patients: EntrepreneurPulse.com.au  Fact Sheet for Healthcare Providers: IncredibleEmployment.be  This  test is not yet approved or cleared by the [place] and has been authorized for detection and/or diagnosis of SARS-CoV-2 by FDA under an Emergency Use Authorization (EUA). This EUA will remain in effect (meaning this test can be used) for the duration of the COVID-19 declaration under Section 564(b)(1) of the Act, 21 U.S.C. section 360bbb-3(b)(1), unless the authorization is terminated or revoked.  Performed at [facility], [place], [place] [address]   Respiratory (~20 pathogens) panel by PCR     Status: Abnormal   Collection Time: 02/26/22  4:07 PM   Specimen: Nasopharyngeal Swab; Respiratory  Result Value Ref Range Status   Adenovirus NOT DETECTED NOT DETECTED Final   Coronavirus 229E NOT DETECTED NOT DETECTED Final    Comment: (NOTE) The Coronavirus on  the Respiratory Panel, DOES NOT test for the novel  Coronavirus (2019 nCoV)    Coronavirus HKU1 NOT DETECTED NOT DETECTED Final   Coronavirus NL63 NOT DETECTED NOT DETECTED Final   Coronavirus OC43 NOT DETECTED NOT DETECTED Final   Metapneumovirus NOT DETECTED NOT DETECTED Final   Rhinovirus / Enterovirus DETECTED (A) NOT DETECTED Final   Influenza A NOT DETECTED NOT DETECTED Final   Influenza B NOT DETECTED NOT DETECTED Final   Parainfluenza Virus 1 NOT DETECTED NOT DETECTED Final   Parainfluenza Virus 2 NOT DETECTED NOT DETECTED Final   Parainfluenza Virus 3 NOT DETECTED NOT DETECTED Final   Parainfluenza Virus 4 NOT DETECTED NOT DETECTED Final   Respiratory Syncytial Virus NOT DETECTED NOT DETECTED Final   Bordetella pertussis NOT DETECTED NOT DETECTED Final   Bordetella Parapertussis NOT DETECTED NOT DETECTED Final   Chlamydophila pneumoniae NOT DETECTED NOT DETECTED Final   Mycoplasma pneumoniae NOT DETECTED NOT DETECTED Final    Comment: Performed at [facility], [place]. [address], [place], [place] [address]         Radiology Studies: DG Chest 1 View  Result Date: 02/28/2022 CLINICAL DATA:  66 year old female with history of dyspnea. EXAM: CHEST  1 VIEW COMPARISON:  Chest x-ray 02/26/2022. FINDINGS: Lung volumes are normal. Mild diffuse interstitial prominence and peribronchial cuffing, increased compared to the prior study. No consolidative airspace disease. No pleural effusions. No pneumothorax. No pulmonary nodule or mass noted. Pulmonary vasculature and the cardiomediastinal silhouette are within normal limits. Atherosclerosis in the thoracic aorta. IMPRESSION: 1. Diffuse interstitial prominence and peribronchial cuffing, increased slightly compared to the prior study, concerning for an acute bronchitis. 2. Aortic atherosclerosis. Electronically Signed   By: Vinnie Langton M.D.   On: 02/28/2022 06:05   ECHOCARDIOGRAM COMPLETE  Result Date: 02/27/2022    ECHOCARDIOGRAM  REPORT   Patient Name:   Helen Benson [facility] Date of Exam: 02/27/2022 Medical Rec #:  [id number]       Height:       63.0 in Accession #:    [id number]      Weight:       160.0 lb Date of Birth:  01/25/1956       BSA:          1.759 m Patient Age:    66 years        BP:           107/57 mmHg Patient Gender: F               HR:           61 bpm. Exam Location:  [facility] Procedure: 2D Echo, Color Doppler and Cardiac Doppler  Indications:     Elevated Troponin  History:         Patient has no prior history of Echocardiogram examinations.                  Risk Factors:Dyslipidemia, Hypertension and Diabetes.  Sonographer:     Sherrie Sport Referring Phys:  [id number] AMY N COX Diagnosing Phys: Kate Sable MD  Sonographer Comments: Suboptimal parasternal window. IMPRESSIONS  1. Left ventricular ejection fraction, by estimation, is 70 to 75%. The left ventricle has hyperdynamic function. The left ventricle has no regional wall motion abnormalities. Left ventricular diastolic parameters are consistent with Grade II diastolic dysfunction (pseudonormalization).  2. Right ventricular systolic function is normal. The right ventricular size is normal. There is normal pulmonary artery systolic pressure.  3. The mitral valve is degenerative. No evidence of mitral valve regurgitation. Severe mitral annular calcification.  4. The aortic valve was not well visualized. Aortic valve regurgitation is not visualized.  5. The inferior vena cava is normal in size with <50% respiratory variability, suggesting right atrial pressure of 8 mmHg. FINDINGS  Left Ventricle: Left ventricular ejection fraction, by estimation, is 70 to 75%. The left ventricle has hyperdynamic function. The left ventricle has no regional wall motion abnormalities. The left ventricular internal cavity size was normal in size. There is no left ventricular hypertrophy. Left ventricular diastolic parameters are consistent with Grade II diastolic dysfunction (pseudonormalization).  Right Ventricle: The right ventricular size is normal. No increase in right ventricular wall thickness. Right ventricular systolic function is normal. There is normal pulmonary artery systolic pressure. The tricuspid regurgitant velocity is 2.21 m/s, and  with an assumed right atrial pressure of 8 mmHg, the estimated right ventricular systolic pressure is 73.2 mmHg. Left Atrium: Left atrial size was normal in size. Right Atrium: Right atrial size was normal in size. Pericardium: There is no evidence of pericardial effusion. Mitral Valve: The mitral valve is degenerative in appearance. Severe mitral annular calcification. No evidence of mitral valve regurgitation. MV peak gradient, 9.5 mmHg. The mean mitral valve gradient is 5.0 mmHg. Tricuspid Valve: The tricuspid valve is not well visualized. Tricuspid valve regurgitation is not demonstrated. Aortic Valve: The aortic valve was not well visualized. Aortic valve regurgitation is not visualized. Aortic valve mean gradient measures 8.5 mmHg. Aortic valve peak gradient measures 15.8 mmHg. Aortic valve area, by VTI measures 2.63 cm. Pulmonic Valve: The pulmonic valve was not well visualized. Pulmonic valve regurgitation is not visualized. Aorta: The aortic root is normal in size and structure. Venous: The inferior vena cava is normal in size with less than 50% respiratory variability, suggesting right atrial pressure of 8 mmHg. IAS/Shunts: No atrial level shunt detected by color flow Doppler.  LEFT VENTRICLE PLAX 2D LVIDd:         3.40 cm   Diastology LVIDs:         2.00 cm   LV e' medial:    5.77 cm/s LV PW:         0.90 cm   LV E/e' medial:  27.6 LV IVS:        0.90 cm   LV e' lateral:   6.64 cm/s LVOT diam:     2.00 cm   LV E/e' lateral: 23.9 LV SV:         101 LV SV Index:   57 LVOT Area:     3.14 cm  RIGHT VENTRICLE RV Basal diam:  3.20 cm RV Mid diam:  2.50 cm RV S prime:     13.70 cm/s TAPSE (M-mode): 3.8 cm LEFT ATRIUM           Index        RIGHT ATRIUM            Index LA diam:      3.70 cm 2.10 cm/m   RA Area:     10.80 cm LA Vol (A2C): 24.3 ml 13.82 ml/m  RA Volume:   22.90 ml  13.02 ml/m LA Vol (A4C): 32.1 ml 18.25 ml/m  AORTIC VALVE AV Area (Vmax):    1.98 cm AV Area (Vmean):   1.89 cm AV Area (VTI):     2.63 cm AV Vmax:           198.50 cm/s AV Vmean:          132.000 cm/s AV VTI:            0.382 m AV Peak Grad:      15.8 mmHg AV Mean Grad:      8.5 mmHg LVOT Vmax:         125.00 cm/s LVOT Vmean:        79.300 cm/s LVOT VTI:          0.320 m LVOT/AV VTI ratio: 0.84  AORTA Ao Root diam: 1.90 cm MITRAL VALVE                TRICUSPID VALVE MV Area (PHT): 2.93 cm     TR Peak grad:   19.5 mmHg MV Area VTI:   2.09 cm     TR Vmax:        221.00 cm/s MV Peak grad:  9.5 mmHg MV Mean grad:  5.0 mmHg     SHUNTS MV Vmax:       1.54 m/s     Systemic VTI:  0.32 m MV Vmean:      105.0 cm/s   Systemic Diam: 2.00 cm MV Decel Time: 259 msec MV E velocity: 159.00 cm/s MV A velocity: 113.00 cm/s MV E/A ratio:  1.41 Kate Sable MD Electronically signed by Kate Sable MD Signature Date/Time: 02/27/2022/11:50:56 AM    Final         Scheduled Meds:  aspirin EC  81 mg Oral Daily   atenolol  25 mg Oral Daily   chlorthalidone  25 mg Oral Daily   dapagliflozin propanediol  10 mg Oral Daily   insulin aspart  0-15 Units Subcutaneous TID WC   insulin aspart  0-5 Units Subcutaneous QHS   ipratropium-albuterol  3 mL Nebulization Q6H   levothyroxine  75 mcg Oral QAC breakfast   losartan  100 mg Oral Daily   methylPREDNISolone (SOLU-MEDROL) injection  80 mg Intravenous Q24H   oxyCODONE  10 mg Oral TID   potassium chloride  40 mEq Oral BID   rosuvastatin  20 mg Oral QHS   Continuous Infusions:  azithromycin 500 mg (02/28/22 2134)     LOS: 3 days    Time spent: 25 mins    Wyvonnia Dusky, MD [facility] Pager [phone]  If 7PM-7AM, please contact night-coverage [email or web address] 03/01/2022, 8:24 AM

## 2022-03-01 NOTE — Plan of Care (Signed)
  Problem: Coping: Goal: Ability to adjust to condition or change in health will improve Outcome: Progressing   Problem: Fluid Volume: Goal: Ability to maintain a balanced intake and output will improve Outcome: Progressing   Problem: Health Behavior/Discharge Planning: Goal: Ability to identify and utilize available resources and services will improve Outcome: Progressing Goal: Ability to manage health-related needs will improve Outcome: Progressing   Problem: Metabolic: Goal: Ability to maintain appropriate glucose levels will improve Outcome: Progressing   Problem: Nutritional: Goal: Maintenance of adequate nutrition will improve Outcome: Progressing Goal: Progress toward achieving an optimal weight will improve Outcome: Progressing   Problem: Skin Integrity: Goal: Risk for impaired skin integrity will decrease Outcome: Progressing

## 2022-03-02 DIAGNOSIS — J206 Acute bronchitis due to rhinovirus: Secondary | ICD-10-CM | POA: Diagnosis not present

## 2022-03-02 DIAGNOSIS — F17200 Nicotine dependence, unspecified, uncomplicated: Secondary | ICD-10-CM | POA: Diagnosis not present

## 2022-03-02 DIAGNOSIS — J9601 Acute respiratory failure with hypoxia: Secondary | ICD-10-CM | POA: Diagnosis not present

## 2022-03-02 LAB — BASIC METABOLIC PANEL
Anion gap: 10 (ref 5–15)
BUN: 47 mg/dL — ABNORMAL HIGH (ref 8–23)
CO2: 32 mmol/L (ref 22–32)
Calcium: 10.4 mg/dL — ABNORMAL HIGH (ref 8.9–10.3)
Chloride: 103 mmol/L (ref 98–111)
Creatinine, Ser: 0.93 mg/dL (ref 0.44–1.00)
GFR, Estimated: >60 mL/min (ref >60)
Glucose, Bld: 169 mg/dL — ABNORMAL HIGH (ref 70–99)
Potassium: 3.8 mmol/L (ref 3.5–5.1)
Sodium: 145 mmol/L (ref 135–145)

## 2022-03-02 LAB — CBC
HCT: 51.2 % — ABNORMAL HIGH (ref 36.0–46.0)
Hemoglobin: 16.7 g/dL — ABNORMAL HIGH (ref 12.0–15.0)
MCH: 30.5 pg (ref 26.0–34.0)
MCHC: 32.6 g/dL (ref 30.0–36.0)
MCV: 93.4 fL (ref 80.0–100.0)
Platelets: 269 10*3/uL (ref 150–400)
RBC: 5.48 MIL/uL — ABNORMAL HIGH (ref 3.87–5.11)
RDW: 13.2 % (ref 11.5–15.5)
WBC: 13.3 10*3/uL — ABNORMAL HIGH (ref 4.0–10.5)
nRBC: 0 % (ref 0.0–0.2)

## 2022-03-02 LAB — GLUCOSE, CAPILLARY
Glucose-Capillary: 166 mg/dL — ABNORMAL HIGH (ref 70–99)
Glucose-Capillary: 181 mg/dL — ABNORMAL HIGH (ref 70–99)
Glucose-Capillary: 147 mg/dL — ABNORMAL HIGH (ref 70–99)

## 2022-03-02 LAB — MAGNESIUM: Magnesium: 2.1 mg/dL (ref 1.7–2.4)

## 2022-03-02 MED ORDER — IPRATROPIUM-ALBUTEROL 20-100 MCG/ACT IN AERS: 1.0000 | INHALATION_SPRAY | Freq: Four times a day (QID) | RESPIRATORY_TRACT | 0 refills | Status: DC | PRN

## 2022-03-02 MED ORDER — PREDNISONE 10 MG PO TABS: ORAL_TABLET | ORAL | 0 refills | Status: DC

## 2022-03-02 MED ORDER — CHLORTHALIDONE 25 MG PO TABS
50.0000 mg | ORAL_TABLET | Freq: Every day | ORAL | Status: DC
  Administered 2022-03-03 – 2022-03-05 (×3): 50 mg via ORAL
  Filled 2022-03-02 (×3): qty 2

## 2022-03-02 MED ORDER — LOSARTAN POTASSIUM 100 MG PO TABS: 100.0000 mg | ORAL_TABLET | Freq: Every day | ORAL | 0 refills | Status: DC

## 2022-03-02 MED ORDER — AZITHROMYCIN 250 MG PO TABS: 250.0000 mg | ORAL_TABLET | Freq: Every day | ORAL | 0 refills | Status: AC

## 2022-03-02 MED ORDER — ATENOLOL 25 MG PO TABS: 25.0000 mg | ORAL_TABLET | Freq: Every day | ORAL | 1 refills | Status: DC

## 2022-03-02 MED ORDER — HYDROCOD POLI-CHLORPHE POLI ER 10-8 MG/5ML PO SUER: 5.0000 mL | Freq: Two times a day (BID) | ORAL | 0 refills | Status: AC | PRN

## 2022-03-02 NOTE — Progress Notes (Signed)
Notified Sharion Settler, NP of Pts. BP: 177/75. No interventions indicated at this time, per provider. Will continue to monitor.

## 2022-03-02 NOTE — Discharge Summary (Addendum)
Physician Discharge Summary  Helen Benson UMP:[id number] DOB: 1956-05-21 DOA: 02/26/2022  PCP: Theresia Lo, NP  Admit date: 02/26/2022 Discharge date: 03/02/2022  Admitted From: home  Disposition:  home   Recommendations for Outpatient Follow-up:  Follow up with PCP in 1-2 weeks F/u w/ cardio, Dr. Saunders Revel, in 2 weeks  Home Health: Equipment/Devices: 2L [place]   Discharge Condition: stable  CODE STATUS: full Diet recommendation: Heart Healthy / Carb Modified  Brief/Interim Summary: HPI was taken from Dr. Tobie Poet: [name] is a 66 year old female with history of hypertension, hyperlipidemia, erythrocytosis, anxiety, hypothyroid, CKD stage IIIa, obesity, tobacco use, non-insulin-dependent diabetes mellitus, who presents to the emergency department for chief concerns of shortness of breath.   Of note, patient was reported to have SPO2 of 62% on room air.   Initial vitals in the emergency department showed temperature of 98.9, respiration rate 21, heart rate of 82, blood pressure 123/70, SPO2 of 100% on 15 L nonrebreather.   Serum sodium is 136, potassium 2.5, chloride 96, bicarb 28, BUN of 33, serum creatinine of 1.40, GFR 41, nonfasting blood glucose 140, WBC 14.7, hemoglobin 15.4, platelets of 246.   Lactic acid was 2.0 and increased to 2.8.   High sensitive troponin was 68 and increased to 114.  BNP was elevated at 255.2.  COVID/influenza A/influenza B PCR were negative.   ED treatment: Aspirin 324 mg p.o. one-time dose, heparin bolus and gtt., Solu-Medrol 125 mg IV, DuoNebs one-time dose, ceftriaxone 1 g IV. -------------------------------------- At bedside, she is able to tell me her name, age, current calendar year, and the current location.  There is a sensory respiratory muscle use.  High flow nasal cannula at 15 L in place.   She reports she has been feeling short of breath since Sunday. She endorses sick contacts as one of her girlfriends was sick with influenza  recently. She endorses a cough, that is productive of light yellow sputum.    She denies trauma to her person. She denies nausea, vomiting. She endorses diarrhea on Sunday, 2-3x times, brown, loose. She denies recent antibiotics use. She denies recent hospitalization. She denies diarrhea yesterday and today.   She denies fever, chest pain, abdominal pain. She denies dysuria, hematuria.   As per Dr. Jimmye Norman 10/4-10/7/23: Pt was found to have acute hypoxic respiratory failure likely secondary to bronchitis and rhinovirus/enterovirus. Pt was treated w/ azithromycin, bronchodilators, steroids, and incentive spirometry. Pt was not able to weaned off of supplemental oxygen as pt desaturated to 84% w/ ambulation. Pt was d/c home w/ 2L [place]. Of note, pt did receive smoking cessation counseling while inpatient. For more information, please see previous progress notes.     Discharge Diagnoses:  Principal Problem:   Acute hypoxemic respiratory failure (HCC) Active Problems:   Anxiety   Diabetes 1.5, managed as type 2 ([place])   Primary hypertension   Smoker   Hypothyroidism   Stage 3a chronic kidney disease (HCC)   Dyslipidemia   Elevated troponin   Acute bronchitis due to Rhinovirus  Acute hypoxic respiratory failure: likely secondary to acute bronchitis found on repeat CXR & rhinovirus/enterovirus. Continue on steroids. 62% on RA on admission. CTA chest neg for PE but b/l pulmonary nodules noted. Will need repeat CT in 3-6 months. Continue on bronchodilators & encourage incentive spirometry. D/c home w/ 2L [place]    Elevated troponins: likely secondary to demand ischemia. D/c heparin. No further inpatient cardiac work-up as per cardio. F/u outpatient w/ cardio, Dr. Saunders Revel, in 2  weeks. Cardio signed off    Hypokalemia: WNL today    HLD:  continue on statin    CKDIIIa: Cr is trending down daily    Hypothyroidism: continue on levothyroxine    Smoker: received smoking cessation counseling > 5 mins as per  admitting physician. Pt declines nicotine patch    HTN: continue on chlorthalidone, losartan. Holding atenolol for 3 days as pt had asymptomatic bradycardia    DM2:  well controlled, HbA1c 6.1 in 12/2021. Continue on SSI w/ accuchecks    Anxiety: severity unknown. Continue on xanax, paroxetine   Discharge Instructions  Discharge Instructions     Diet - low sodium heart healthy   Complete by: As directed    Diet Carb Modified   Complete by: As directed    Discharge instructions   Complete by: As directed    F/u w/ PCP in 1-2 weeks. F/u w/ cardio, Dr. Saunders Revel, in 2 weeks.   Discharge instructions   Complete by: As directed    Hold atenolol for 3 days and then you may restart   Increase activity slowly   Complete by: As directed       Allergies as of 03/02/2022   No Known Allergies      Medication List     TAKE these medications    ALLERGY RELIEF 24-HR PO Take 1 tablet by mouth daily at 2 PM.   ALPRAZolam 0.5 MG tablet Commonly known as: XANAX Take 1 tablet (0.5 mg total) by mouth 2 (two) times daily as needed for anxiety.   atenolol 25 MG tablet Commonly known as: TENORMIN Take 1 tablet (25 mg total) by mouth daily. Hold this medication for 3 days and then you may restart What changed: additional instructions   azithromycin 250 MG tablet Commonly known as: Zithromax Take 1 tablet (250 mg total) by mouth daily for 2 days.   chlorpheniramine-HYDROcodone 10-8 MG/5ML Commonly known as: TUSSIONEX Take 5 mLs by mouth every 12 (twelve) hours as needed for up to 7 days for cough.   chlorthalidone 50 MG tablet Commonly known as: HYGROTON Take 25 mg by mouth daily.   Crestor 20 MG tablet Generic drug: rosuvastatin Take 20 mg by mouth daily.   Farxiga 10 MG Tabs tablet Generic drug: dapagliflozin propanediol Take 10 mg by mouth daily.   Ipratropium-Albuterol 20-100 MCG/ACT Aers respimat Commonly known as: COMBIVENT Inhale 1 puff into the lungs every 6 (six) hours  as needed for wheezing or shortness of breath.   levothyroxine 75 MCG tablet Commonly known as: Synthroid Take 1 tablet (75 mcg total) by mouth daily before breakfast.   losartan 100 MG tablet Commonly known as: COZAAR Take 1 tablet (100 mg total) by mouth daily. Start taking on: March 03, 2022 What changed:  medication strength how much to take how to take this when to take this additional instructions Another medication with the same name was removed. Continue taking this medication, and follow the directions you see here.   naloxone 4 MG/0.1ML Liqd nasal spray kit Commonly known as: NARCAN   Oxycodone HCl 10 MG Tabs Take 10 mg by mouth 3 (three) times daily.   predniSONE 10 MG tablet Commonly known as: DELTASONE 97m daily x 2 days, 543mdaily x 2 days, 4025maily x 2 days, 70m82mily x 2 days, 20mg10mly x 2 days, 10mg 49my x 2 days then stop   VITAMIN D-3 PO Take 1 capsule by mouth daily at 2 PM.   Xtampza ER  18 MG C12a Generic drug: oxyCODONE ER Xtampza ER 18 mg capsule sprinkle        Follow-up Information     Theresia Lo, NP Follow up.   Specialties: Nurse Practitioner, Family Medicine Why: F/u in 1-2 weeks Contact information: [place] [address] [phone]         Nelva Bush, MD Follow up in 2 week(s).   Specialty: Cardiology Contact information: [place] [address] [phone]                No Known Allergies  Consultations: Cardio    Procedures/Studies: DG Chest 1 View  Result Date: 02/28/2022 CLINICAL DATA:  66 year old female with history of dyspnea. EXAM: CHEST  1 VIEW COMPARISON:  Chest x-ray 02/26/2022. FINDINGS: Lung volumes are normal. Mild diffuse interstitial prominence and peribronchial cuffing, increased compared to the prior study. No consolidative airspace disease. No pleural effusions. No pneumothorax. No pulmonary nodule or mass noted. Pulmonary  vasculature and the cardiomediastinal silhouette are within normal limits. Atherosclerosis in the thoracic aorta. IMPRESSION: 1. Diffuse interstitial prominence and peribronchial cuffing, increased slightly compared to the prior study, concerning for an acute bronchitis. 2. Aortic atherosclerosis. Electronically Signed   By: Vinnie Langton M.D.   On: 02/28/2022 06:05   ECHOCARDIOGRAM COMPLETE  Result Date: 02/27/2022    ECHOCARDIOGRAM REPORT   Patient Name:   BRITLEY GASHI [facility] Date of Exam: 02/27/2022 Medical Rec #:  [id number]       Height:       63.0 in Accession #:    [id number]      Weight:       160.0 lb Date of Birth:  02/24/1956       BSA:          1.759 m Patient Age:    51 years        BP:           107/57 mmHg Patient Gender: F               HR:           61 bpm. Exam Location:  [facility] Procedure: 2D Echo, Color Doppler and Cardiac Doppler Indications:     Elevated Troponin  History:         Patient has no prior history of Echocardiogram examinations.                  Risk Factors:Dyslipidemia, Hypertension and Diabetes.  Sonographer:     Sherrie Sport Referring Phys:  [id number] AMY N COX Diagnosing Phys: Kate Sable MD  Sonographer Comments: Suboptimal parasternal window. IMPRESSIONS  1. Left ventricular ejection fraction, by estimation, is 70 to 75%. The left ventricle has hyperdynamic function. The left ventricle has no regional wall motion abnormalities. Left ventricular diastolic parameters are consistent with Grade II diastolic dysfunction (pseudonormalization).  2. Right ventricular systolic function is normal. The right ventricular size is normal. There is normal pulmonary artery systolic pressure.  3. The mitral valve is degenerative. No evidence of mitral valve regurgitation. Severe mitral annular calcification.  4. The aortic valve was not well visualized. Aortic valve regurgitation is not visualized.  5. The inferior vena cava is normal in size with <50% respiratory variability, suggesting  right atrial pressure of 8 mmHg. FINDINGS  Left Ventricle: Left ventricular ejection fraction, by estimation, is 70 to 75%. The left ventricle has hyperdynamic function. The left ventricle has no regional wall motion abnormalities. The left ventricular  internal cavity size was normal in size. There is no left ventricular hypertrophy. Left ventricular diastolic parameters are consistent with Grade II diastolic dysfunction (pseudonormalization). Right Ventricle: The right ventricular size is normal. No increase in right ventricular wall thickness. Right ventricular systolic function is normal. There is normal pulmonary artery systolic pressure. The tricuspid regurgitant velocity is 2.21 m/s, and  with an assumed right atrial pressure of 8 mmHg, the estimated right ventricular systolic pressure is 47.0 mmHg. Left Atrium: Left atrial size was normal in size. Right Atrium: Right atrial size was normal in size. Pericardium: There is no evidence of pericardial effusion. Mitral Valve: The mitral valve is degenerative in appearance. Severe mitral annular calcification. No evidence of mitral valve regurgitation. MV peak gradient, 9.5 mmHg. The mean mitral valve gradient is 5.0 mmHg. Tricuspid Valve: The tricuspid valve is not well visualized. Tricuspid valve regurgitation is not demonstrated. Aortic Valve: The aortic valve was not well visualized. Aortic valve regurgitation is not visualized. Aortic valve mean gradient measures 8.5 mmHg. Aortic valve peak gradient measures 15.8 mmHg. Aortic valve area, by VTI measures 2.63 cm. Pulmonic Valve: The pulmonic valve was not well visualized. Pulmonic valve regurgitation is not visualized. Aorta: The aortic root is normal in size and structure. Venous: The inferior vena cava is normal in size with less than 50% respiratory variability, suggesting right atrial pressure of 8 mmHg. IAS/Shunts: No atrial level shunt detected by color flow Doppler.  LEFT VENTRICLE PLAX 2D LVIDd:          3.40 cm   Diastology LVIDs:         2.00 cm   LV e' medial:    5.77 cm/s LV PW:         0.90 cm   LV E/e' medial:  27.6 LV IVS:        0.90 cm   LV e' lateral:   6.64 cm/s LVOT diam:     2.00 cm   LV E/e' lateral: 23.9 LV SV:         101 LV SV Index:   57 LVOT Area:     3.14 cm  RIGHT VENTRICLE RV Basal diam:  3.20 cm RV Mid diam:    2.50 cm RV S prime:     13.70 cm/s TAPSE (M-mode): 3.8 cm LEFT ATRIUM           Index        RIGHT ATRIUM           Index LA diam:      3.70 cm 2.10 cm/m   RA Area:     10.80 cm LA Vol (A2C): 24.3 ml 13.82 ml/m  RA Volume:   22.90 ml  13.02 ml/m LA Vol (A4C): 32.1 ml 18.25 ml/m  AORTIC VALVE AV Area (Vmax):    1.98 cm AV Area (Vmean):   1.89 cm AV Area (VTI):     2.63 cm AV Vmax:           198.50 cm/s AV Vmean:          132.000 cm/s AV VTI:            0.382 m AV Peak Grad:      15.8 mmHg AV Mean Grad:      8.5 mmHg LVOT Vmax:         125.00 cm/s LVOT Vmean:        79.300 cm/s LVOT VTI:          0.320 m LVOT/AV  VTI ratio: 0.84  AORTA Ao Root diam: 1.90 cm MITRAL VALVE                TRICUSPID VALVE MV Area (PHT): 2.93 cm     TR Peak grad:   19.5 mmHg MV Area VTI:   2.09 cm     TR Vmax:        221.00 cm/s MV Peak grad:  9.5 mmHg MV Mean grad:  5.0 mmHg     SHUNTS MV Vmax:       1.54 m/s     Systemic VTI:  0.32 m MV Vmean:      105.0 cm/s   Systemic Diam: 2.00 cm MV Decel Time: 259 msec MV E velocity: 159.00 cm/s MV A velocity: 113.00 cm/s MV E/A ratio:  1.41 Kate Sable MD Electronically signed by Kate Sable MD Signature Date/Time: 02/27/2022/11:50:56 AM    Final    CT Angio Chest Pulmonary Embolism (PE) W or WO Contrast  Result Date: 02/26/2022 CLINICAL DATA:  Respiratory distress. EXAM: CT ANGIOGRAPHY CHEST WITH CONTRAST TECHNIQUE: Multidetector CT imaging of the chest was performed using the standard protocol during bolus administration of intravenous contrast. Multiplanar CT image reconstructions and MIPs were obtained to evaluate the vascular anatomy.  RADIATION DOSE REDUCTION: This exam was performed according to the departmental dose-optimization program which includes automated exposure control, adjustment of the mA and/or kV according to patient size and/or use of iterative reconstruction technique. CONTRAST:  40m OMNIPAQUE IOHEXOL 350 MG/ML SOLN COMPARISON:  None Available. FINDINGS: Cardiovascular: Satisfactory opacification of the pulmonary arteries to the segmental level. No evidence of pulmonary embolism. Normal heart size. No pericardial effusion. Mediastinum/Nodes: No enlarged mediastinal, hilar, or axillary lymph nodes. Thyroid gland, trachea, and esophagus demonstrate no significant findings. Lungs/Pleura: No pneumothorax or pleural effusion is noted. 3 mm nodule is noted anteriorly in right upper lobe best seen on image number 31 of series 6. 6 mm subpleural nodule is noted posteriorly in the left lower lobe best seen on image number 52 of series 6. Mild left basilar subsegmental atelectasis or scarring is noted. Upper Abdomen: No acute abnormality. Musculoskeletal: No chest wall abnormality. No acute or significant osseous findings. Review of the MIP images confirms the above findings. IMPRESSION: No definite evidence of pulmonary embolus. Bilateral pulmonary nodules are noted, the largest measuring 6 mm in the left lower lobe. Non-contrast chest CT at 3-6 months is recommended. If the nodules are stable at time of repeat CT, then future CT at 18-24 months (from today's scan) is considered optional for low-risk patients, but is recommended for high-risk patients. This recommendation follows the consensus statement: Guidelines for Management of Incidental Pulmonary Nodules Detected on CT Images: From the Fleischner Society 2017; Radiology 2017; 284:228-243. Electronically Signed   By: JMarijo ConceptionM.D.   On: 02/26/2022 15:43   DG Chest 1 View  Result Date: 02/26/2022 CLINICAL DATA:  Shortness of breath EXAM: CHEST  1 VIEW COMPARISON:   10/23/2015 FINDINGS: Cardiac size is within normal limits. Lung fields are clear of any infiltrates or pulmonary edema. There is no pleural effusion or pneumothorax. Degenerative changes are noted in both shoulders. IMPRESSION: No active disease. Electronically Signed   By: PElmer PickerM.D.   On: 02/26/2022 12:52   (Echo, Carotid, EGD, Colonoscopy, ERCP)    Subjective: Pt c/o intermittent cough    Discharge Exam: Vitals:   03/02/22 0438 03/02/22 0808  BP: (!) 160/74 (!) 182/71  Pulse:  61  Resp: 20  16  Temp:  97.7 F (36.5 C)  SpO2:  94%   Vitals:   03/01/22 2350 03/02/22 0344 03/02/22 0438 03/02/22 0808  BP: (!) 177/75 (!) 185/91 (!) 160/74 (!) 182/71  Pulse: (!) 54 61  61  Resp: '18 20 20 16  ' Temp: 97.8 F (36.6 C) 98.3 F (36.8 C)  97.7 F (36.5 C)  TempSrc:    Oral  SpO2: 95% 94%  94%  Weight:      Height:        General: Pt is alert, awake, not in acute distress Cardiovascular: S1/S2 +, no rubs, no gallops Respiratory: decreased breath sounds b/l  Abdominal: Soft, NT, ND, bowel sounds + Extremities: no cyanosis    The results of significant diagnostics from this hospitalization (including imaging, microbiology, ancillary and laboratory) are listed below for reference.     Microbiology: Recent Results (from the past 240 hour(s))  Resp Panel by RT-PCR (Flu A&B, Covid) Anterior Nasal Swab     Status: None   Collection Time: 02/26/22 12:25 PM   Specimen: Anterior Nasal Swab  Result Value Ref Range Status   SARS Coronavirus 2 by RT PCR NEGATIVE NEGATIVE Final    Comment: (NOTE) SARS-CoV-2 target nucleic acids are NOT DETECTED.  The SARS-CoV-2 RNA is generally detectable in upper respiratory specimens during the acute phase of infection. The lowest concentration of SARS-CoV-2 viral copies this assay can detect is 138 copies/mL. A negative result does not preclude SARS-Cov-2 infection and should not be used as the sole basis for treatment or other  patient management decisions. A negative result may occur with  improper specimen collection/handling, submission of specimen other than nasopharyngeal swab, presence of viral mutation(s) within the areas targeted by this assay, and inadequate number of viral copies(<138 copies/mL). A negative result must be combined with clinical observations, patient history, and epidemiological information. The expected result is Negative.  Fact Sheet for Patients:  EntrepreneurPulse.com.au  Fact Sheet for Healthcare Providers:  IncredibleEmployment.be  This test is no t yet approved or cleared by the [place] FDA and  has been authorized for detection and/or diagnosis of SARS-CoV-2 by FDA under an Emergency Use Authorization (EUA). This EUA will remain  in effect (meaning this test can be used) for the duration of the COVID-19 declaration under Section 564(b)(1) of the Act, 21 U.S.C.section 360bbb-3(b)(1), unless the authorization is terminated  or revoked sooner.       Influenza A by PCR NEGATIVE NEGATIVE Final   Influenza B by PCR NEGATIVE NEGATIVE Final    Comment: (NOTE) The Xpert Xpress SARS-CoV-2/FLU/RSV plus assay is intended as an aid in the diagnosis of influenza from Nasopharyngeal swab specimens and should not be used as a sole basis for treatment. Nasal washings and aspirates are unacceptable for Xpert Xpress SARS-CoV-2/FLU/RSV testing.  Fact Sheet for Patients: EntrepreneurPulse.com.au  Fact Sheet for Healthcare Providers: IncredibleEmployment.be  This test is not yet approved or cleared by the [place] FDA and has been authorized for detection and/or diagnosis of SARS-CoV-2 by FDA under an Emergency Use Authorization (EUA). This EUA will remain in effect (meaning this test can be used) for the duration of the COVID-19 declaration under Section 564(b)(1) of the Act, 21 U.S.C. section  360bbb-3(b)(1), unless the authorization is terminated or revoked.  Performed at [facility], [address] [place], [place] [address]   Respiratory (~20 pathogens) panel by PCR     Status: Abnormal   Collection Time: 02/26/22  4:07 PM  Specimen: Nasopharyngeal Swab; Respiratory  Result Value Ref Range Status   Adenovirus NOT DETECTED NOT DETECTED Final   Coronavirus 229E NOT DETECTED NOT DETECTED Final    Comment: (NOTE) The Coronavirus on the Respiratory Panel, DOES NOT test for the novel  Coronavirus (2019 nCoV)    Coronavirus HKU1 NOT DETECTED NOT DETECTED Final   Coronavirus NL63 NOT DETECTED NOT DETECTED Final   Coronavirus OC43 NOT DETECTED NOT DETECTED Final   Metapneumovirus NOT DETECTED NOT DETECTED Final   Rhinovirus / Enterovirus DETECTED (A) NOT DETECTED Final   Influenza A NOT DETECTED NOT DETECTED Final   Influenza B NOT DETECTED NOT DETECTED Final   Parainfluenza Virus 1 NOT DETECTED NOT DETECTED Final   Parainfluenza Virus 2 NOT DETECTED NOT DETECTED Final   Parainfluenza Virus 3 NOT DETECTED NOT DETECTED Final   Parainfluenza Virus 4 NOT DETECTED NOT DETECTED Final   Respiratory Syncytial Virus NOT DETECTED NOT DETECTED Final   Bordetella pertussis NOT DETECTED NOT DETECTED Final   Bordetella Parapertussis NOT DETECTED NOT DETECTED Final   Chlamydophila pneumoniae NOT DETECTED NOT DETECTED Final   Mycoplasma pneumoniae NOT DETECTED NOT DETECTED Final    Comment: Performed at [facility], [place]. [address] [place], [place] [address]     Labs: BNP (last 3 results) Recent Labs    02/26/22 1225  BNP 093.2*   Basic Metabolic Panel: Recent Labs  Lab 02/26/22 1225 02/27/22 0432 02/28/22 0456 03/01/22 0422 03/02/22 0636  NA 136 140 144 144 145  K 3.5 2.8* 3.6 3.0* 3.8  CL 96* 104 105 98 103  CO2 28 30 32 35* 32  GLUCOSE 140* 177* 129* 165* 169*  BUN 33* 32* 33* 43* 47*  CREATININE 1.40* 1.04* 1.07* 0.98 0.93  CALCIUM 9.7 9.3 9.5  10.0 10.4*  MG  --   --  2.0 2.4 2.1   Liver Function Tests: Recent Labs  Lab 02/26/22 1225  AST 30  ALT 19  ALKPHOS 57  BILITOT 1.3*  PROT 7.8  ALBUMIN 4.2   No results for input(s): "LIPASE", "AMYLASE" in the last 168 hours. No results for input(s): "AMMONIA" in the last 168 hours. CBC: Recent Labs  Lab 02/26/22 1225 02/27/22 0432 02/28/22 0456 03/01/22 0422 03/02/22 0636  WBC 14.7* 7.9 18.8* 14.3* 13.3*  NEUTROABS 10.5*  --   --   --   --   HGB 15.4* 14.4 15.1* 14.6 16.7*  HCT 47.8* 43.4 47.2* 46.2* 51.2*  MCV 94.5 93.3 95.4 94.9 93.4  PLT 246 189 243 240 269   Cardiac Enzymes: No results for input(s): "CKTOTAL", "CKMB", "CKMBINDEX", "TROPONINI" in the last 168 hours. BNP: Invalid input(s): "POCBNP" CBG: Recent Labs  Lab 03/01/22 0807 03/01/22 1141 03/01/22 1642 03/01/22 2105 03/02/22 0803  GLUCAP 160* 132* 125* 126* 166*   D-Dimer No results for input(s): "DDIMER" in the last 72 hours. Hgb A1c No results for input(s): "HGBA1C" in the last 72 hours. Lipid Profile No results for input(s): "CHOL", "HDL", "LDLCALC", "TRIG", "CHOLHDL", "LDLDIRECT" in the last 72 hours. Thyroid function studies No results for input(s): "TSH", "T4TOTAL", "T3FREE", "THYROIDAB" in the last 72 hours.  Invalid input(s): "FREET3" Anemia work up No results for input(s): "VITAMINB12", "FOLATE", "FERRITIN", "TIBC", "IRON", "RETICCTPCT" in the last 72 hours. Urinalysis    Component Value Date/Time   COLORURINE YELLOW (A) 10/23/2015 1302   APPEARANCEUR CLEAR (A) 10/23/2015 1302   LABSPEC 1.013 10/23/2015 1302   PHURINE 5.0 10/23/2015 1302   GLUCOSEU NEGATIVE 10/23/2015 1302  HGBUR NEGATIVE 10/23/2015 1302   BILIRUBINUR NEGATIVE 10/23/2015 1302   KETONESUR NEGATIVE 10/23/2015 1302   PROTEINUR NEGATIVE 10/23/2015 1302   NITRITE NEGATIVE 10/23/2015 1302   LEUKOCYTESUR 2+ (A) 10/23/2015 1302   Sepsis Labs Recent Labs  Lab 02/27/22 0432 02/28/22 0456 03/01/22 0422  03/02/22 0636  WBC 7.9 18.8* 14.3* 13.3*   Microbiology Recent Results (from the past 240 hour(s))  Resp Panel by RT-PCR (Flu A&B, Covid) Anterior Nasal Swab     Status: None   Collection Time: 02/26/22 12:25 PM   Specimen: Anterior Nasal Swab  Result Value Ref Range Status   SARS Coronavirus 2 by RT PCR NEGATIVE NEGATIVE Final    Comment: (NOTE) SARS-CoV-2 target nucleic acids are NOT DETECTED.  The SARS-CoV-2 RNA is generally detectable in upper respiratory specimens during the acute phase of infection. The lowest concentration of SARS-CoV-2 viral copies this assay can detect is 138 copies/mL. A negative result does not preclude SARS-Cov-2 infection and should not be used as the sole basis for treatment or other patient management decisions. A negative result may occur with  improper specimen collection/handling, submission of specimen other than nasopharyngeal swab, presence of viral mutation(s) within the areas targeted by this assay, and inadequate number of viral copies(<138 copies/mL). A negative result must be combined with clinical observations, patient history, and epidemiological information. The expected result is Negative.  Fact Sheet for Patients:  EntrepreneurPulse.com.au  Fact Sheet for Healthcare Providers:  IncredibleEmployment.be  This test is no t yet approved or cleared by the [place] FDA and  has been authorized for detection and/or diagnosis of SARS-CoV-2 by FDA under an Emergency Use Authorization (EUA). This EUA will remain  in effect (meaning this test can be used) for the duration of the COVID-19 declaration under Section 564(b)(1) of the Act, 21 U.S.C.section 360bbb-3(b)(1), unless the authorization is terminated  or revoked sooner.       Influenza A by PCR NEGATIVE NEGATIVE Final   Influenza B by PCR NEGATIVE NEGATIVE Final    Comment: (NOTE) The Xpert Xpress SARS-CoV-2/FLU/RSV plus assay is intended  as an aid in the diagnosis of influenza from Nasopharyngeal swab specimens and should not be used as a sole basis for treatment. Nasal washings and aspirates are unacceptable for Xpert Xpress SARS-CoV-2/FLU/RSV testing.  Fact Sheet for Patients: EntrepreneurPulse.com.au  Fact Sheet for Healthcare Providers: IncredibleEmployment.be  This test is not yet approved or cleared by the [place] FDA and has been authorized for detection and/or diagnosis of SARS-CoV-2 by FDA under an Emergency Use Authorization (EUA). This EUA will remain in effect (meaning this test can be used) for the duration of the COVID-19 declaration under Section 564(b)(1) of the Act, 21 U.S.C. section 360bbb-3(b)(1), unless the authorization is terminated or revoked.  Performed at [facility], [place], [place] [address]   Respiratory (~20 pathogens) panel by PCR     Status: Abnormal   Collection Time: 02/26/22  4:07 PM   Specimen: Nasopharyngeal Swab; Respiratory  Result Value Ref Range Status   Adenovirus NOT DETECTED NOT DETECTED Final   Coronavirus 229E NOT DETECTED NOT DETECTED Final    Comment: (NOTE) The Coronavirus on the Respiratory Panel, DOES NOT test for the novel  Coronavirus (2019 nCoV)    Coronavirus HKU1 NOT DETECTED NOT DETECTED Final   Coronavirus NL63 NOT DETECTED NOT DETECTED Final   Coronavirus OC43 NOT DETECTED NOT DETECTED Final   Metapneumovirus NOT DETECTED NOT DETECTED Final   Rhinovirus / Enterovirus  DETECTED (A) NOT DETECTED Final   Influenza A NOT DETECTED NOT DETECTED Final   Influenza B NOT DETECTED NOT DETECTED Final   Parainfluenza Virus 1 NOT DETECTED NOT DETECTED Final   Parainfluenza Virus 2 NOT DETECTED NOT DETECTED Final   Parainfluenza Virus 3 NOT DETECTED NOT DETECTED Final   Parainfluenza Virus 4 NOT DETECTED NOT DETECTED Final   Respiratory Syncytial Virus NOT DETECTED NOT DETECTED Final    Bordetella pertussis NOT DETECTED NOT DETECTED Final   Bordetella Parapertussis NOT DETECTED NOT DETECTED Final   Chlamydophila pneumoniae NOT DETECTED NOT DETECTED Final   Mycoplasma pneumoniae NOT DETECTED NOT DETECTED Final    Comment: Performed at [facility], [place] [address], [place], [place] [address]     Time coordinating discharge: Over 30 minutes  SIGNED:   Wyvonnia Dusky, MD  [facility] 03/02/2022, 12:15 PM Pager   If 7PM-7AM, please contact night-coverage [email or web address]

## 2022-03-02 NOTE — Progress Notes (Addendum)
Pt. C/o shortness of breath and tightness in her chest. Within a few minutes, Pt. reports her symptoms are resolved and says she thinks she had a panic attack. Sharion Settler, NP made aware.

## 2022-03-02 NOTE — Progress Notes (Addendum)
Pt. C/o dyspnea, feeling hot, and pain at her IV site. Pt. Placed back on oxygen and IV ABX stopped. Pt. Reported relief. Attempted to stick patient for new IV site unsuccessfully, and order for IV team placed.

## 2022-03-02 NOTE — Evaluation (Signed)
Physical Therapy Evaluation Patient Details Name: Helen Benson MRN: [id number] DOB: [birth year] Today's Date: 03/02/2022  History of Present Illness  Per MD: [name] is a 66 year old female with history of hypertension, hyperlipidemia, erythrocytosis, anxiety, hypothyroid, CKD stage IIIa, obesity, tobacco use, non-insulin-dependent diabetes mellitus, who presents to the emergency department for chief concerns of shortness of breath.    Clinical Impression  Pt received in supine position and agreeable to therapy. Pt participated in bed-level exercises and was able to perform well, but is experiencing shortness of breathe still.  Pt vitals were assessed and noted below with continuation of elevated BP in supine position: 178/84 and sitting position: 185/67.  Pt mobilizes well and is able to get to the bathroom independently, however deferred due to feeling syncope and dizziness upon sitting at EOB.  Will continue to monitor during hospital stay.  If continued weakness and syncope, pt may benefit from HHPT to return to PLOF.  If pt is able to make significant progress over the next day or two with therapy, pt could defer HHPT.       Recommendations for follow up therapy are one component of a multi-disciplinary discharge planning process, led by the attending physician.  Recommendations may be updated based on patient status, additional functional criteria and insurance authorization.  Follow Up Recommendations Home health PT      Assistance Recommended at Discharge PRN  Patient can return home with the following  A little help with walking and/or transfers;A little help with bathing/dressing/bathroom;Assistance with cooking/housework;Assist for transportation;Help with stairs or ramp for entrance    Equipment Recommendations None recommended by PT  Recommendations for Other Services       Functional Status Assessment Patient has had a recent decline in their functional status and  demonstrates the ability to make significant improvements in function in a reasonable and predictable amount of time.     Precautions / Restrictions Restrictions Weight Bearing Restrictions: No      Mobility  Bed Mobility Overal bed mobility: Modified Independent             General bed mobility comments: pt able to come upright into sitting, but deferred anything following due to continued eleveated BP and feeling of dizziness/syncope.    Transfers                   General transfer comment: deferred due to syncope feeling    Ambulation/Gait                  Stairs            Wheelchair Mobility    Modified Rankin (Stroke Patients Only)       Balance                                             Pertinent Vitals/Pain Pain Assessment Pain Assessment: No/denies pain    Home Living Family/patient expects to be discharged to:: Private residence Living Arrangements: Alone Available Help at Discharge: Family;Friend(s);Available 24 hours/day Type of Home: Apartment Home Access: Stairs to enter Entrance Stairs-Rails: Right Entrance Stairs-Number of Steps: 7   Home Layout: One level Home Equipment: None      Prior Function Prior Level of Function : Independent/Modified Independent  Hand Dominance   Dominant Hand: Right    Extremity/Trunk Assessment   Upper Extremity Assessment Upper Extremity Assessment: Generalized weakness    Lower Extremity Assessment Lower Extremity Assessment: Generalized weakness       Communication      Cognition Arousal/Alertness: Awake/alert Behavior During Therapy: WFL for tasks assessed/performed Overall Cognitive Status: Within Functional Limits for tasks assessed                                          General Comments General comments (skin integrity, edema, etc.): not able to assess.    Exercises Total Joint Exercises Ankle  Circles/Pumps: AROM, Strengthening, Both, 10 reps, Supine Quad Sets: AROM, Strengthening, Both, 10 reps, Supine Gluteal Sets: AROM, Strengthening, Both, 10 reps, Supine Hip ABduction/ADduction: AROM, Strengthening, Both, 10 reps, Supine   Assessment/Plan    PT Assessment Patient needs continued PT services  PT Problem List Decreased strength;Decreased activity tolerance;Decreased balance;Decreased mobility;Decreased safety awareness       PT Treatment Interventions DME instruction;Gait training;Stair training;Functional mobility training;Therapeutic activities;Therapeutic exercise;Balance training;Neuromuscular re-education    PT Goals (Current goals can be found in the Care Plan section)  Acute Rehab PT Goals Patient Stated Goal: to get breathing under control. PT Goal Formulation: With patient Time For Goal Achievement: 03/16/22 Potential to Achieve Goals: Good    Frequency Min 2X/week     Co-evaluation               AM-PAC PT "6 Clicks" Mobility  Outcome Measure Help needed turning from your back to your side while in a flat bed without using bedrails?: A Little Help needed moving from lying on your back to sitting on the side of a flat bed without using bedrails?: A Little Help needed moving to and from a bed to a chair (including a wheelchair)?: A Little Help needed standing up from a chair using your arms (e.g., wheelchair or bedside chair)?: A Little Help needed to walk in hospital room?: A Little Help needed climbing 3-5 steps with a railing? : A Little 6 Click Score: 18    End of Session   Activity Tolerance: Treatment limited secondary to medical complications (Comment) Patient left: in bed;with call bell/phone within reach;with bed alarm set;Other (comment) (assisted with calling dining services for delayed lunch order as pt was very hungry.) Nurse Communication: Mobility status;Other (comment) (vital signs still being elevated.) PT Visit Diagnosis:  Unsteadiness on feet (R26.81);Other abnormalities of gait and mobility (R26.89);Muscle weakness (generalized) (M62.81)    Time: 5638-7564 PT Time Calculation (min) (ACUTE ONLY): 27 min   Charges:   PT Evaluation $PT Eval Low Complexity: 1 Low          Gwenlyn Saran, PT, DPT 03/02/22, 4:47 PM

## 2022-03-02 NOTE — Progress Notes (Signed)
SATURATION QUALIFICATIONS: (This note is used to comply with regulatory documentation for home oxygen)  Patient Saturations on Room Air at Rest = 93%  Patient Saturations on Room Air while Ambulating = 84%  Patient Saturations on 2 Liters of oxygen while Ambulating = 95%  Please briefly explain why patient needs home oxygen: to maintain sats >90% and prevent sob

## 2022-03-02 NOTE — TOC Transition Note (Addendum)
Transition of Care [facility]) - CM/SW Discharge Note   Patient Details  Name: Helen Benson MRN: [id number] Date of Birth: Nov 03, 1955  Transition of Care [facility]) CM/SW Contact:  Harriet Masson, RN Phone Number:[phone] 03/02/2022, 2:38 PM   Clinical Narrative:    Recommended home O2 due to low saturation score. TOC spoke with pt concerning agency choice receptive to [employer]. Spoke with [facility] for portable to bedside and home O2 set up. Pt reports supportive family and transportation home alone with transportation to all her medical appointments. Orders provided and Sats score presented for verification for home O2. No additional needs presented at this time.  TOC remains available for any additional needs.    Final next level of care: Home/Self Care Barriers to Discharge: Continued Medical Work up   Patient Goals and CMS Choice Patient states their goals for this hospitalization and ongoing recovery are:: wants to go home   Choice offered to / list presented to : Patient  Discharge Placement                    Patient and family notified of of transfer: 03/02/22 (Spoke with pt directly)  Discharge Plan and Services   Discharge Planning Services: CM Consult            DME Arranged: Continuous passive motion machine DME Agency: [employer] Date DME Agency Contacted: 03/02/22 Time DME Agency Contacted: [id number] Representative spoke with at DME Agency: [place] ([place]) Interventions     Readmission Risk Interventions     No data to display

## 2022-03-03 DIAGNOSIS — J9601 Acute respiratory failure with hypoxia: Secondary | ICD-10-CM | POA: Diagnosis not present

## 2022-03-03 DIAGNOSIS — J206 Acute bronchitis due to rhinovirus: Secondary | ICD-10-CM | POA: Diagnosis not present

## 2022-03-03 DIAGNOSIS — E876 Hypokalemia: Secondary | ICD-10-CM | POA: Diagnosis not present

## 2022-03-03 LAB — GLUCOSE, CAPILLARY
Glucose-Capillary: 160 mg/dL — ABNORMAL HIGH (ref 70–99)
Glucose-Capillary: 171 mg/dL — ABNORMAL HIGH (ref 70–99)
Glucose-Capillary: 164 mg/dL — ABNORMAL HIGH (ref 70–99)
Glucose-Capillary: 155 mg/dL — ABNORMAL HIGH (ref 70–99)
Glucose-Capillary: 140 mg/dL — ABNORMAL HIGH (ref 70–99)

## 2022-03-03 LAB — BASIC METABOLIC PANEL
Anion gap: 9 (ref 5–15)
BUN: 56 mg/dL — ABNORMAL HIGH (ref 8–23)
CO2: 31 mmol/L (ref 22–32)
Calcium: 10.2 mg/dL (ref 8.9–10.3)
Chloride: 102 mmol/L (ref 98–111)
Creatinine, Ser: 0.93 mg/dL (ref 0.44–1.00)
GFR, Estimated: >60 mL/min (ref >60)
Glucose, Bld: 169 mg/dL — ABNORMAL HIGH (ref 70–99)
Potassium: 3.2 mmol/L — ABNORMAL LOW (ref 3.5–5.1)
Sodium: 142 mmol/L (ref 135–145)

## 2022-03-03 LAB — MAGNESIUM: Magnesium: 2 mg/dL (ref 1.7–2.4)

## 2022-03-03 LAB — CBC
HCT: 51 % — ABNORMAL HIGH (ref 36.0–46.0)
Hemoglobin: 16.7 g/dL — ABNORMAL HIGH (ref 12.0–15.0)
MCH: 30.4 pg (ref 26.0–34.0)
MCHC: 32.7 g/dL (ref 30.0–36.0)
MCV: 92.9 fL (ref 80.0–100.0)
Platelets: 279 10*3/uL (ref 150–400)
RBC: 5.49 MIL/uL — ABNORMAL HIGH (ref 3.87–5.11)
RDW: 13.4 % (ref 11.5–15.5)
WBC: 12.8 10*3/uL — ABNORMAL HIGH (ref 4.0–10.5)
nRBC: 0 % (ref 0.0–0.2)

## 2022-03-03 MED ORDER — ISOSORBIDE MONONITRATE ER 30 MG PO TB24
30.0000 mg | ORAL_TABLET | Freq: Every day | ORAL | Status: DC
  Administered 2022-03-04 – 2022-03-05 (×2): 30 mg via ORAL
  Filled 2022-03-03 (×2): qty 1

## 2022-03-03 MED ORDER — POTASSIUM CHLORIDE CRYS ER 20 MEQ PO TBCR
40.0000 meq | EXTENDED_RELEASE_TABLET | Freq: Once | ORAL | Status: AC
  Administered 2022-03-03: 40 meq via ORAL
  Filled 2022-03-03: qty 2

## 2022-03-03 MED ORDER — ISOSORBIDE MONONITRATE ER 30 MG PO TB24
15.0000 mg | ORAL_TABLET | Freq: Once | ORAL | Status: AC
  Administered 2022-03-03: 15 mg via ORAL
  Filled 2022-03-03: qty 1

## 2022-03-03 NOTE — TOC Transition Note (Signed)
Transition of Care [facility]) - CM/SW Discharge Note   Patient Details  Name: Helen Benson MRN: [id number] Date of Birth: [birth year]  Transition of Care [facility]) CM/SW Contact:  Harriet Masson, RN Phone Number: [phone] 03/03/2022, 11:56 AM   Clinical Narrative:    Spoke with pt today and arranged the recommended HHPT. Pt receptive to [facility] care. Spoke with Corene Cornea (liaison) who accepted this pt for services. Pt declined any DME at this time with no recommendations noted via PT. Pt again has sufficient support, transportation with no additional needs.  TOC remains available for any additional discharge needs.   Final next level of care: [place] Barriers to Discharge: No Barriers Identified   Patient Goals and CMS Choice Patient states their goals for this hospitalization and ongoing recovery are:: wants to go home CMS Medicare.gov Compare Post Acute Care list provided to:: Patient Choice offered to / list presented to : Patient  Discharge Placement                    Patient and family notified of of transfer: 03/03/22 (Could not reach daughter. Spoke with pt directly on [place] arrangements)  Discharge Plan and Services   Discharge Planning Services: CM Consult            DME Arranged: N/A (incorrect documentation NO CPM machine for this pt.) DME Agency: [employer] Date DME Agency Contacted: 03/02/22 Time DME Agency Contacted: [id number] Representative spoke with at DME Agency: [place]: PT [place]: [place] ([place]) Date [place]: 03/03/22 Time [place]: 7530 Representative spoke with at [place]: [place] ([place]) Interventions     Readmission Risk Interventions     No data to display

## 2022-03-03 NOTE — Progress Notes (Signed)
PROGRESS NOTE    Helen Benson  EHU:[id number] DOB: May 17, 1956 DOA: 02/26/2022 PCP: Theresia Lo, NP   Assessment & Plan:   Principal Problem:   Acute hypoxemic respiratory failure ([place]) Active Problems:   Anxiety   Diabetes 1.5, managed as type 2 ([place])   Primary hypertension   Smoker   Hypothyroidism   Stage 3a chronic kidney disease ([place])   Dyslipidemia   Elevated troponin   Acute bronchitis due to Rhinovirus  Assessment and Plan: Acute hypoxic respiratory failure: likely secondary to acute bronchitis found on repeat CXR & rhinovirus/enterovirus. Continue on steroids. 62% on RA on admission. Continue on supplemental oxygen and wean as tolerated. CTA chest neg for PE but b/l pulmonary nodules noted. Will need repeat CT in 3-6 months. Continue on bronchodilators & encourage incentive spirometry   Elevated troponins: likely secondary to demand ischemia. D/c heparin. No further inpatient cardiac work-up as per cardio. F/u outpatient w/ cardio, Dr. Saunders Revel, in 2 weeks. Cardio signed off   Hypokalemia: KCl repleated.  Mg is WNL  HLD: continue on statin   CKDIIIa: Cr is stable   Hypothyroidism: continue on levothyroxine   Smoker: received smoking cessation counseling   HTN: poorly controlled. Increased dose of chlorthalidone, losartan. Holding atenolol secondary to intermittent bradycardia. Will start imdur   DM2: HbA1c 6.1, well controlled. Continue on SSI w/ accuchecks   Anxiety: severity unknown. Continue on paroxetine, xanax          DVT prophylaxis: lovenox  Code Status: full  Family Communication:  Disposition Plan: likely d/c back home   Level of care: Progressive  Status is: Inpatient Remains inpatient appropriate because: severity of illness, still very short of breath today     Consultants:  cardio  Procedures:   Antimicrobials:     Subjective: Pt c/o shortness of breath   Objective: Vitals:   03/03/22 0509 03/03/22 0855 03/03/22 1327  03/03/22 1453  BP: (!) 179/73 (!) 199/78 (!) 203/76 (!) 189/85  Pulse:  73 68   Resp:  18 18   Temp:      TempSrc:      SpO2:      Weight:      Height:        Intake/Output Summary (Last 24 hours) at 03/03/2022 1603 Last data filed at 03/02/2022 1906 Gross per 24 hour  Intake 100 ml  Output --  Net 100 ml    Filed Weights   02/26/22 1222  Weight: 72.6 kg    Examination:  General exam: Appears uncomfortable  Respiratory system: course breath sounds b/l  Cardiovascular system: S1/S2+. No rubs or clicks  Gastrointestinal system: abd is soft, NT, ND & hypoactive bowel sounds  Central nervous system: Alert and oriented. Moves all extremities  Psychiatry:  judgement and insight appears normal. Flat mood and affect     Data Reviewed: I have personally reviewed following labs and imaging studies  CBC: Recent Labs  Lab 02/26/22 1225 02/27/22 0432 02/28/22 0456 03/01/22 0422 03/02/22 0636 03/03/22 0523  WBC 14.7* 7.9 18.8* 14.3* 13.3* 12.8*  NEUTROABS 10.5*  --   --   --   --   --   HGB 15.4* 14.4 15.1* 14.6 16.7* 16.7*  HCT 47.8* 43.4 47.2* 46.2* 51.2* 51.0*  MCV 94.5 93.3 95.4 94.9 93.4 92.9  PLT 246 189 243 240 269 785   Basic Metabolic Panel: Recent Labs  Lab 02/27/22 0432 02/28/22 0456 03/01/22 0422 03/02/22 0636 03/03/22 0523  NA 140 144 144 145  142  K 2.8* 3.6 3.0* 3.8 3.2*  CL 104 105 98 103 102  CO2 30 32 35* 32 31  GLUCOSE 177* 129* 165* 169* 169*  BUN 32* 33* 43* 47* 56*  CREATININE 1.04* 1.07* 0.98 0.93 0.93  CALCIUM 9.3 9.5 10.0 10.4* 10.2  MG  --  2.0 2.4 2.1 2.0   GFR: Estimated Creatinine Clearance: 56.8 mL/min (by C-G formula based on SCr of 0.93 mg/dL). Liver Function Tests: Recent Labs  Lab 02/26/22 1225  AST 30  ALT 19  ALKPHOS 57  BILITOT 1.3*  PROT 7.8  ALBUMIN 4.2   No results for input(s): "LIPASE", "AMYLASE" in the last 168 hours. No results for input(s): "AMMONIA" in the last 168 hours. Coagulation Profile: Recent  Labs  Lab 02/26/22 1630  INR 1.1   Cardiac Enzymes: No results for input(s): "CKTOTAL", "CKMB", "CKMBINDEX", "TROPONINI" in the last 168 hours. BNP (last 3 results) No results for input(s): "PROBNP" in the last 8760 hours. HbA1C: No results for input(s): "HGBA1C" in the last 72 hours. CBG: Recent Labs  Lab 03/02/22 1649 03/02/22 2120 03/03/22 0738 03/03/22 0917 03/03/22 1154  GLUCAP 181* 147* 160* 171* 164*   Lipid Profile: No results for input(s): "CHOL", "HDL", "LDLCALC", "TRIG", "CHOLHDL", "LDLDIRECT" in the last 72 hours.  Thyroid Function Tests: No results for input(s): "TSH", "T4TOTAL", "FREET4", "T3FREE", "THYROIDAB" in the last 72 hours.  Anemia Panel: No results for input(s): "VITAMINB12", "FOLATE", "FERRITIN", "TIBC", "IRON", "RETICCTPCT" in the last 72 hours. Sepsis Labs: Recent Labs  Lab 02/26/22 1225 02/26/22 1425 02/26/22 2255 02/27/22 0002  LATICACIDVEN 2.0* 2.8* 2.0* 2.0*    Recent Results (from the past 240 hour(s))  Resp Panel by RT-PCR (Flu A&B, Covid) Anterior Nasal Swab     Status: None   Collection Time: 02/26/22 12:25 PM   Specimen: Anterior Nasal Swab  Result Value Ref Range Status   SARS Coronavirus 2 by RT PCR NEGATIVE NEGATIVE Final    Comment: (NOTE) SARS-CoV-2 target nucleic acids are NOT DETECTED.  The SARS-CoV-2 RNA is generally detectable in upper respiratory specimens during the acute phase of infection. The lowest concentration of SARS-CoV-2 viral copies this assay can detect is 138 copies/mL. A negative result does not preclude SARS-Cov-2 infection and should not be used as the sole basis for treatment or other patient management decisions. A negative result may occur with  improper specimen collection/handling, submission of specimen other than nasopharyngeal swab, presence of viral mutation(s) within the areas targeted by this assay, and inadequate number of viral copies(<138 copies/mL). A negative result must be combined  with clinical observations, patient history, and epidemiological information. The expected result is Negative.  Fact Sheet for Patients:  EntrepreneurPulse.com.au  Fact Sheet for Healthcare Providers:  IncredibleEmployment.be  This test is no t yet approved or cleared by the [place] FDA and  has been authorized for detection and/or diagnosis of SARS-CoV-2 by FDA under an Emergency Use Authorization (EUA). This EUA will remain  in effect (meaning this test can be used) for the duration of the COVID-19 declaration under Section 564(b)(1) of the Act, 21 U.S.C.section 360bbb-3(b)(1), unless the authorization is terminated  or revoked sooner.       Influenza A by PCR NEGATIVE NEGATIVE Final   Influenza B by PCR NEGATIVE NEGATIVE Final    Comment: (NOTE) The Xpert Xpress SARS-CoV-2/FLU/RSV plus assay is intended as an aid in the diagnosis of influenza from Nasopharyngeal swab specimens and should not be used as a sole basis for  treatment. Nasal washings and aspirates are unacceptable for Xpert Xpress SARS-CoV-2/FLU/RSV testing.  Fact Sheet for Patients: EntrepreneurPulse.com.au  Fact Sheet for Healthcare Providers: IncredibleEmployment.be  This test is not yet approved or cleared by the [place] FDA and has been authorized for detection and/or diagnosis of SARS-CoV-2 by FDA under an Emergency Use Authorization (EUA). This EUA will remain in effect (meaning this test can be used) for the duration of the COVID-19 declaration under Section 564(b)(1) of the Act, 21 U.S.C. section 360bbb-3(b)(1), unless the authorization is terminated or revoked.  Performed at [facility], [place], [place] [address]   Respiratory (~20 pathogens) panel by PCR     Status: Abnormal   Collection Time: 02/26/22  4:07 PM   Specimen: Nasopharyngeal Swab; Respiratory  Result Value Ref Range Status    Adenovirus NOT DETECTED NOT DETECTED Final   Coronavirus 229E NOT DETECTED NOT DETECTED Final    Comment: (NOTE) The Coronavirus on the Respiratory Panel, DOES NOT test for the novel  Coronavirus (2019 nCoV)    Coronavirus HKU1 NOT DETECTED NOT DETECTED Final   Coronavirus NL63 NOT DETECTED NOT DETECTED Final   Coronavirus OC43 NOT DETECTED NOT DETECTED Final   Metapneumovirus NOT DETECTED NOT DETECTED Final   Rhinovirus / Enterovirus DETECTED (A) NOT DETECTED Final   Influenza A NOT DETECTED NOT DETECTED Final   Influenza B NOT DETECTED NOT DETECTED Final   Parainfluenza Virus 1 NOT DETECTED NOT DETECTED Final   Parainfluenza Virus 2 NOT DETECTED NOT DETECTED Final   Parainfluenza Virus 3 NOT DETECTED NOT DETECTED Final   Parainfluenza Virus 4 NOT DETECTED NOT DETECTED Final   Respiratory Syncytial Virus NOT DETECTED NOT DETECTED Final   Bordetella pertussis NOT DETECTED NOT DETECTED Final   Bordetella Parapertussis NOT DETECTED NOT DETECTED Final   Chlamydophila pneumoniae NOT DETECTED NOT DETECTED Final   Mycoplasma pneumoniae NOT DETECTED NOT DETECTED Final    Comment: Performed at [facility], [place]. [address], [place], [place] [address]         Radiology Studies: No results found.      Scheduled Meds:  aspirin EC  81 mg Oral Daily   chlorthalidone  50 mg Oral Daily   dapagliflozin propanediol  10 mg Oral Daily   enoxaparin (LOVENOX) injection  40 mg Subcutaneous Q24H   guaiFENesin  600 mg Oral BID   insulin aspart  0-15 Units Subcutaneous TID WC   insulin aspart  0-5 Units Subcutaneous QHS   levothyroxine  75 mcg Oral QAC breakfast   losartan  100 mg Oral Daily   methylPREDNISolone (SOLU-MEDROL) injection  80 mg Intravenous Q24H   oxyCODONE  10 mg Oral TID   rosuvastatin  20 mg Oral QHS   Continuous Infusions:     LOS: 5 days    Time spent: 25 mins    Wyvonnia Dusky, MD [facility] Pager [phone]  If 7PM-7AM, please contact  night-coverage [email or web address] 03/03/2022, 4:03 PM

## 2022-03-03 NOTE — Progress Notes (Addendum)
Sharion Settler, NP made aware of Pts. BP 179/73 after hydralazine. No new orders received.

## 2022-03-04 DIAGNOSIS — J206 Acute bronchitis due to rhinovirus: Secondary | ICD-10-CM | POA: Diagnosis not present

## 2022-03-04 DIAGNOSIS — J9601 Acute respiratory failure with hypoxia: Secondary | ICD-10-CM | POA: Diagnosis not present

## 2022-03-04 DIAGNOSIS — E876 Hypokalemia: Secondary | ICD-10-CM | POA: Diagnosis not present

## 2022-03-04 LAB — BASIC METABOLIC PANEL
Anion gap: 10 (ref 5–15)
BUN: 59 mg/dL — ABNORMAL HIGH (ref 8–23)
CO2: 30 mmol/L (ref 22–32)
Calcium: 10.4 mg/dL — ABNORMAL HIGH (ref 8.9–10.3)
Chloride: 104 mmol/L (ref 98–111)
Creatinine, Ser: 0.96 mg/dL (ref 0.44–1.00)
GFR, Estimated: >60 mL/min (ref >60)
Glucose, Bld: 169 mg/dL — ABNORMAL HIGH (ref 70–99)
Potassium: 3.2 mmol/L — ABNORMAL LOW (ref 3.5–5.1)
Sodium: 144 mmol/L (ref 135–145)

## 2022-03-04 LAB — CBC
HCT: 51.8 % — ABNORMAL HIGH (ref 36.0–46.0)
Hemoglobin: 17.3 g/dL — ABNORMAL HIGH (ref 12.0–15.0)
MCH: 30.8 pg (ref 26.0–34.0)
MCHC: 33.4 g/dL (ref 30.0–36.0)
MCV: 92.3 fL (ref 80.0–100.0)
Platelets: 295 10*3/uL (ref 150–400)
RBC: 5.61 MIL/uL — ABNORMAL HIGH (ref 3.87–5.11)
RDW: 13.3 % (ref 11.5–15.5)
WBC: 15 10*3/uL — ABNORMAL HIGH (ref 4.0–10.5)
nRBC: 0 % (ref 0.0–0.2)

## 2022-03-04 LAB — GLUCOSE, CAPILLARY
Glucose-Capillary: 157 mg/dL — ABNORMAL HIGH (ref 70–99)
Glucose-Capillary: 154 mg/dL — ABNORMAL HIGH (ref 70–99)
Glucose-Capillary: 145 mg/dL — ABNORMAL HIGH (ref 70–99)
Glucose-Capillary: 127 mg/dL — ABNORMAL HIGH (ref 70–99)

## 2022-03-04 LAB — MAGNESIUM: Magnesium: 2 mg/dL (ref 1.7–2.4)

## 2022-03-04 MED ORDER — LABETALOL HCL 5 MG/ML IV SOLN
10.0000 mg | Freq: Three times a day (TID) | INTRAVENOUS | Status: DC | PRN
  Administered 2022-03-04: 10 mg via INTRAVENOUS
  Filled 2022-03-04: qty 4

## 2022-03-04 MED ORDER — POTASSIUM CHLORIDE CRYS ER 20 MEQ PO TBCR
60.0000 meq | EXTENDED_RELEASE_TABLET | Freq: Once | ORAL | Status: AC
  Administered 2022-03-04: 60 meq via ORAL
  Filled 2022-03-04: qty 3

## 2022-03-04 MED ORDER — GUAIFENESIN-DM 100-10 MG/5ML PO SYRP
5.0000 mL | ORAL_SOLUTION | ORAL | Status: DC | PRN
  Administered 2022-03-04 (×2): 5 mL via ORAL
  Filled 2022-03-04 (×2): qty 10

## 2022-03-04 MED ORDER — SODIUM CHLORIDE 0.9 % IV SOLN
8.0000 mg | Freq: Four times a day (QID) | INTRAVENOUS | Status: DC | PRN
  Administered 2022-03-04: 8 mg via INTRAVENOUS
  Filled 2022-03-04: qty 4

## 2022-03-04 NOTE — Plan of Care (Signed)
Problem: Education: Goal: Ability to describe self-care measures that may prevent or decrease complications (Diabetes Survival Skills Education) will improve 03/04/2022 2334 by Bonner Puna, RN Outcome: Progressing 03/04/2022 2331 by Bonner Puna, RN Outcome: Progressing Goal: Individualized Educational Video(s) 03/04/2022 2334 by Bonner Puna, RN Outcome: Progressing 03/04/2022 2331 by Bonner Puna, RN Outcome: Progressing   Problem: Coping: Goal: Ability to adjust to condition or change in health will improve 03/04/2022 2334 by Bonner Puna, RN Outcome: Progressing 03/04/2022 2331 by Bonner Puna, RN Outcome: Progressing   Problem: Fluid Volume: Goal: Ability to maintain a balanced intake and output will improve 03/04/2022 2334 by Bonner Puna, RN Outcome: Progressing 03/04/2022 2331 by Bonner Puna, RN Outcome: Progressing   Problem: Health Behavior/Discharge Planning: Goal: Ability to identify and utilize available resources and services will improve 03/04/2022 2334 by Bonner Puna, RN Outcome: Progressing 03/04/2022 2331 by Bonner Puna, RN Outcome: Progressing Goal: Ability to manage health-related needs will improve 03/04/2022 2334 by Bonner Puna, RN Outcome: Progressing 03/04/2022 2331 by Bonner Puna, RN Outcome: Progressing   Problem: Metabolic: Goal: Ability to maintain appropriate glucose levels will improve 03/04/2022 2334 by Bonner Puna, RN Outcome: Progressing 03/04/2022 2331 by Bonner Puna, RN Outcome: Progressing   Problem: Nutritional: Goal: Maintenance of adequate nutrition will improve 03/04/2022 2334 by Bonner Puna, RN Outcome: Progressing 03/04/2022 2331 by Bonner Puna, RN Outcome: Progressing Goal: Progress toward achieving an optimal weight will improve 03/04/2022 2334 by Bonner Puna, RN Outcome: Progressing 03/04/2022 2331 by Bonner Puna, RN Outcome: Progressing   Problem: Skin Integrity: Goal: Risk for impaired skin integrity will decrease 03/04/2022 2334 by  Bonner Puna, RN Outcome: Progressing 03/04/2022 2331 by Bonner Puna, RN Outcome: Progressing   Problem: Tissue Perfusion: Goal: Adequacy of tissue perfusion will improve 03/04/2022 2334 by Bonner Puna, RN Outcome: Progressing 03/04/2022 2331 by Bonner Puna, RN Outcome: Progressing   Problem: Education: Goal: Knowledge of General Education information will improve Description: Including pain rating scale, medication(s)/side effects and non-pharmacologic comfort measures 03/04/2022 2334 by Bonner Puna, RN Outcome: Progressing 03/04/2022 2331 by Bonner Puna, RN Outcome: Progressing   Problem: Health Behavior/Discharge Planning: Goal: Ability to manage health-related needs will improve 03/04/2022 2334 by Bonner Puna, RN Outcome: Progressing 03/04/2022 2331 by Bonner Puna, RN Outcome: Progressing   Problem: Clinical Measurements: Goal: Ability to maintain clinical measurements within normal limits will improve 03/04/2022 2334 by Bonner Puna, RN Outcome: Progressing 03/04/2022 2331 by Bonner Puna, RN Outcome: Progressing Goal: Will remain free from infection 03/04/2022 2334 by Bonner Puna, RN Outcome: Progressing 03/04/2022 2331 by Bonner Puna, RN Outcome: Progressing Goal: Diagnostic test results will improve 03/04/2022 2334 by Bonner Puna, RN Outcome: Progressing 03/04/2022 2331 by Bonner Puna, RN Outcome: Progressing Goal: Respiratory complications will improve 03/04/2022 2334 by Bonner Puna, RN Outcome: Progressing 03/04/2022 2331 by Bonner Puna, RN Outcome: Progressing Goal: Cardiovascular complication will be avoided 03/04/2022 2334 by Bonner Puna, RN Outcome: Progressing 03/04/2022 2331 by Bonner Puna, RN Outcome: Progressing   Problem: Activity: Goal: Risk for activity intolerance will decrease 03/04/2022 2334 by Bonner Puna, RN Outcome: Progressing 03/04/2022 2331 by Bonner Puna, RN Outcome: Progressing   Problem: Nutrition: Goal: Adequate nutrition will be  maintained 03/04/2022 2334 by Bonner Puna, RN Outcome: Progressing 03/04/2022 2331 by Bonner Puna, RN Outcome: Progressing   Problem: Coping: Goal: Level of anxiety will decrease 03/04/2022 2334 by Bonner Puna, RN Outcome: Progressing 03/04/2022 2331 by Bonner Puna, RN Outcome: Progressing   Problem: Elimination: Goal: Will not experience complications related to bowel motility 03/04/2022 2334  by Bonner Puna, RN Outcome: Progressing 03/04/2022 2331 by Bonner Puna, RN Outcome: Progressing Goal: Will not experience complications related to urinary retention 03/04/2022 2334 by Bonner Puna, RN Outcome: Progressing 03/04/2022 2331 by Bonner Puna, RN Outcome: Progressing   Problem: Pain Managment: Goal: General experience of comfort will improve 03/04/2022 2334 by Bonner Puna, RN Outcome: Progressing 03/04/2022 2331 by Bonner Puna, RN Outcome: Progressing   Problem: Safety: Goal: Ability to remain free from injury will improve 03/04/2022 2334 by Bonner Puna, RN Outcome: Progressing 03/04/2022 2331 by Bonner Puna, RN Outcome: Progressing   Problem: Skin Integrity: Goal: Risk for impaired skin integrity will decrease 03/04/2022 2334 by Bonner Puna, RN Outcome: Progressing 03/04/2022 2331 by Bonner Puna, RN Outcome: Progressing

## 2022-03-04 NOTE — Plan of Care (Signed)

## 2022-03-04 NOTE — Progress Notes (Signed)
PROGRESS NOTE    DELESHA POHLMAN  XBM:[id number] DOB: 09/23/1955 DOA: 02/26/2022 PCP: Theresia Lo, NP   Assessment & Plan:   Principal Problem:   Acute hypoxemic respiratory failure ([place]) Active Problems:   Anxiety   Diabetes 1.5, managed as type 2 ([place])   Primary hypertension   Smoker   Hypothyroidism   Stage 3a chronic kidney disease ([place])   Dyslipidemia   Elevated troponin   Acute bronchitis due to Rhinovirus  Assessment and Plan: Acute hypoxic respiratory failure: likely secondary to acute bronchitis found on repeat CXR & rhinovirus/enterovirus. 62% on RA on admission. CTA chest neg for PE but b/l pulmonary nodules noted. Will need repeat CT in 3-6 months. Continue on supplemental oxygen and wean as tolerated. Continue on steroids, bronchodilators & encourage incentive spirometry   Elevated troponins: likely secondary to demand ischemia. D/c heparin. No further inpatient cardiac work-up as per cardio. F/u outpatient w/ cardio, Dr. Saunders Revel, in 2 weeks. Cardio signed off   Hypokalemia: potassium given. Mg is WNL   HLD: continue on statin   CKDIIIa: Cr is stable    Hypothyroidism: continue on levothyroxine   Smoker: received smoking cessation counseling    HTN: poorly controlled. Continue on increased dose of losartan, chlorthalidone & started on imdur. Atenolol on hold for intermittent bradycardia. D/c IV hydralazine prn secondary to abd pain, nausea & vomiting after taking it.   DM2: well controlled, HbA1c 6.1. Continue on SSI w/ accuchecks   Anxiety: severity unknown. Continue on xanax, paroxetine          DVT prophylaxis: lovenox  Code Status: full  Family Communication:  Disposition Plan: likely d/c back home   Level of care: Progressive  Status is: Inpatient Remains inpatient appropriate because: severity of illness, vomiting today     Consultants:  cardio  Procedures:   Antimicrobials:     Subjective: Pt c/o nausea & vomiting    Objective: Vitals:   03/04/22 0807 03/04/22 0959 03/04/22 1052 03/04/22 1158  BP: (!) 202/69 (!) 192/89 (!) 147/78 (!) 157/78  Pulse: 78   89  Resp: 18     Temp: 97.8 F (36.6 C)   97.6 F (36.4 C)  TempSrc:      SpO2: 95%   97%  Weight:      Height:       No intake or output data in the 24 hours ending 03/04/22 1421   Filed Weights   02/26/22 1222  Weight: 72.6 kg    Examination:  General exam: Appears calm but uncomfortable   Respiratory system: diminished breath sounds b/l  Cardiovascular system: S1 & S2+. No rubs or clicks  Gastrointestinal system: Abd is soft, NT, ND & hypoactive bowel sounds  Central nervous system: Alert and oriented. Moves all extremities  Psychiatry:  judgement and insight appears normal. Flat mood and affect     Data Reviewed: I have personally reviewed following labs and imaging studies  CBC: Recent Labs  Lab 02/26/22 1225 02/27/22 0432 02/28/22 0456 03/01/22 0422 03/02/22 0636 03/03/22 0523 03/04/22 0511  WBC 14.7*   < > 18.8* 14.3* 13.3* 12.8* 15.0*  NEUTROABS 10.5*  --   --   --   --   --   --   HGB 15.4*   < > 15.1* 14.6 16.7* 16.7* 17.3*  HCT 47.8*   < > 47.2* 46.2* 51.2* 51.0* 51.8*  MCV 94.5   < > 95.4 94.9 93.4 92.9 92.3  PLT 246   < >  243 240 269 279 295   < > = values in this interval not displayed.   Basic Metabolic Panel: Recent Labs  Lab 02/28/22 0456 03/01/22 0422 03/02/22 0636 03/03/22 0523 03/04/22 0511  NA 144 144 145 142 144  K 3.6 3.0* 3.8 3.2* 3.2*  CL 105 98 103 102 104  CO2 32 35* 32 31 30  GLUCOSE 129* 165* 169* 169* 169*  BUN 33* 43* 47* 56* 59*  CREATININE 1.07* 0.98 0.93 0.93 0.96  CALCIUM 9.5 10.0 10.4* 10.2 10.4*  MG 2.0 2.4 2.1 2.0 2.0   GFR: Estimated Creatinine Clearance: 55.1 mL/min (by C-G formula based on SCr of 0.96 mg/dL). Liver Function Tests: Recent Labs  Lab 02/26/22 1225  AST 30  ALT 19  ALKPHOS 57  BILITOT 1.3*  PROT 7.8  ALBUMIN 4.2   No results for input(s):  "LIPASE", "AMYLASE" in the last 168 hours. No results for input(s): "AMMONIA" in the last 168 hours. Coagulation Profile: Recent Labs  Lab 02/26/22 1630  INR 1.1   Cardiac Enzymes: No results for input(s): "CKTOTAL", "CKMB", "CKMBINDEX", "TROPONINI" in the last 168 hours. BNP (last 3 results) No results for input(s): "PROBNP" in the last 8760 hours. HbA1C: No results for input(s): "HGBA1C" in the last 72 hours. CBG: Recent Labs  Lab 03/03/22 1154 03/03/22 1756 03/03/22 2110 03/04/22 0813 03/04/22 1200  GLUCAP 164* 155* 140* 157* 154*   Lipid Profile: No results for input(s): "CHOL", "HDL", "LDLCALC", "TRIG", "CHOLHDL", "LDLDIRECT" in the last 72 hours.  Thyroid Function Tests: No results for input(s): "TSH", "T4TOTAL", "FREET4", "T3FREE", "THYROIDAB" in the last 72 hours.  Anemia Panel: No results for input(s): "VITAMINB12", "FOLATE", "FERRITIN", "TIBC", "IRON", "RETICCTPCT" in the last 72 hours. Sepsis Labs: Recent Labs  Lab 02/26/22 1225 02/26/22 1425 02/26/22 2255 02/27/22 0002  LATICACIDVEN 2.0* 2.8* 2.0* 2.0*    Recent Results (from the past 240 hour(s))  Resp Panel by RT-PCR (Flu A&B, Covid) Anterior Nasal Swab     Status: None   Collection Time: 02/26/22 12:25 PM   Specimen: Anterior Nasal Swab  Result Value Ref Range Status   SARS Coronavirus 2 by RT PCR NEGATIVE NEGATIVE Final    Comment: (NOTE) SARS-CoV-2 target nucleic acids are NOT DETECTED.  The SARS-CoV-2 RNA is generally detectable in upper respiratory specimens during the acute phase of infection. The lowest concentration of SARS-CoV-2 viral copies this assay can detect is 138 copies/mL. A negative result does not preclude SARS-Cov-2 infection and should not be used as the sole basis for treatment or other patient management decisions. A negative result may occur with  improper specimen collection/handling, submission of specimen other than nasopharyngeal swab, presence of viral mutation(s)  within the areas targeted by this assay, and inadequate number of viral copies(<138 copies/mL). A negative result must be combined with clinical observations, patient history, and epidemiological information. The expected result is Negative.  Fact Sheet for Patients:  EntrepreneurPulse.com.au  Fact Sheet for Healthcare Providers:  IncredibleEmployment.be  This test is no t yet approved or cleared by the [place] FDA and  has been authorized for detection and/or diagnosis of SARS-CoV-2 by FDA under an Emergency Use Authorization (EUA). This EUA will remain  in effect (meaning this test can be used) for the duration of the COVID-19 declaration under Section 564(b)(1) of the Act, 21 U.S.C.section 360bbb-3(b)(1), unless the authorization is terminated  or revoked sooner.       Influenza A by PCR NEGATIVE NEGATIVE Final   Influenza B by  PCR NEGATIVE NEGATIVE Final    Comment: (NOTE) The Xpert Xpress SARS-CoV-2/FLU/RSV plus assay is intended as an aid in the diagnosis of influenza from Nasopharyngeal swab specimens and should not be used as a sole basis for treatment. Nasal washings and aspirates are unacceptable for Xpert Xpress SARS-CoV-2/FLU/RSV testing.  Fact Sheet for Patients: EntrepreneurPulse.com.au  Fact Sheet for Healthcare Providers: IncredibleEmployment.be  This test is not yet approved or cleared by the [place] FDA and has been authorized for detection and/or diagnosis of SARS-CoV-2 by FDA under an Emergency Use Authorization (EUA). This EUA will remain in effect (meaning this test can be used) for the duration of the COVID-19 declaration under Section 564(b)(1) of the Act, 21 U.S.C. section 360bbb-3(b)(1), unless the authorization is terminated or revoked.  Performed at [facility], [place], [place] [address]   Respiratory (~20 pathogens) panel by PCR      Status: Abnormal   Collection Time: 02/26/22  4:07 PM   Specimen: Nasopharyngeal Swab; Respiratory  Result Value Ref Range Status   Adenovirus NOT DETECTED NOT DETECTED Final   Coronavirus 229E NOT DETECTED NOT DETECTED Final    Comment: (NOTE) The Coronavirus on the Respiratory Panel, DOES NOT test for the novel  Coronavirus (2019 nCoV)    Coronavirus HKU1 NOT DETECTED NOT DETECTED Final   Coronavirus NL63 NOT DETECTED NOT DETECTED Final   Coronavirus OC43 NOT DETECTED NOT DETECTED Final   Metapneumovirus NOT DETECTED NOT DETECTED Final   Rhinovirus / Enterovirus DETECTED (A) NOT DETECTED Final   Influenza A NOT DETECTED NOT DETECTED Final   Influenza B NOT DETECTED NOT DETECTED Final   Parainfluenza Virus 1 NOT DETECTED NOT DETECTED Final   Parainfluenza Virus 2 NOT DETECTED NOT DETECTED Final   Parainfluenza Virus 3 NOT DETECTED NOT DETECTED Final   Parainfluenza Virus 4 NOT DETECTED NOT DETECTED Final   Respiratory Syncytial Virus NOT DETECTED NOT DETECTED Final   Bordetella pertussis NOT DETECTED NOT DETECTED Final   Bordetella Parapertussis NOT DETECTED NOT DETECTED Final   Chlamydophila pneumoniae NOT DETECTED NOT DETECTED Final   Mycoplasma pneumoniae NOT DETECTED NOT DETECTED Final    Comment: Performed at [facility], [place]. [address] [place], [place] [address]         Radiology Studies: No results found.      Scheduled Meds:  aspirin EC  81 mg Oral Daily   chlorthalidone  50 mg Oral Daily   dapagliflozin propanediol  10 mg Oral Daily   enoxaparin (LOVENOX) injection  40 mg Subcutaneous Q24H   guaiFENesin  600 mg Oral BID   insulin aspart  0-15 Units Subcutaneous TID WC   insulin aspart  0-5 Units Subcutaneous QHS   isosorbide mononitrate  30 mg Oral Daily   levothyroxine  75 mcg Oral QAC breakfast   losartan  100 mg Oral Daily   methylPREDNISolone (SOLU-MEDROL) injection  80 mg Intravenous Q24H   oxyCODONE  10 mg Oral TID   rosuvastatin  20 mg  Oral QHS   Continuous Infusions:  ondansetron (ZOFRAN) IV 8 mg (03/04/22 0954)      LOS: 6 days    Time spent: 30 mins    Wyvonnia Dusky, MD [facility] Pager [phone]  If 7PM-7AM, please contact night-coverage [email or web address] 03/04/2022, 2:21 PM

## 2022-03-04 NOTE — Care Management Important Message (Signed)
Important Message  Patient Details  Name: RAELEE ROSSMANN MRN: [id number] Date of Birth: [birth year]   Medicare Important Message Given:  Yes  Reviewed Medicare IM with patient via room phone due to isolation status.  Aware of Medicare right to appeal.  Declined copy to reference at this time.     Dannette Barbara 03/04/2022, 1:47 PM

## 2022-03-05 LAB — BASIC METABOLIC PANEL
Anion gap: 7 (ref 5–15)
BUN: 57 mg/dL — ABNORMAL HIGH (ref 8–23)
CO2: 31 mmol/L (ref 22–32)
Calcium: 9.3 mg/dL (ref 8.9–10.3)
Chloride: 102 mmol/L (ref 98–111)
Creatinine, Ser: 0.99 mg/dL (ref 0.44–1.00)
GFR, Estimated: >60 mL/min (ref >60)
Glucose, Bld: 127 mg/dL — ABNORMAL HIGH (ref 70–99)
Potassium: 4 mmol/L (ref 3.5–5.1)
Sodium: 140 mmol/L (ref 135–145)

## 2022-03-05 LAB — GLUCOSE, CAPILLARY
Glucose-Capillary: 131 mg/dL — ABNORMAL HIGH (ref 70–99)
Glucose-Capillary: 149 mg/dL — ABNORMAL HIGH (ref 70–99)

## 2022-03-05 LAB — CBC
HCT: 52.8 % — ABNORMAL HIGH (ref 36.0–46.0)
Hemoglobin: 17.4 g/dL — ABNORMAL HIGH (ref 12.0–15.0)
MCH: 30.6 pg (ref 26.0–34.0)
MCHC: 33 g/dL (ref 30.0–36.0)
MCV: 92.8 fL (ref 80.0–100.0)
Platelets: 275 10*3/uL (ref 150–400)
RBC: 5.69 MIL/uL — ABNORMAL HIGH (ref 3.87–5.11)
RDW: 12.9 % (ref 11.5–15.5)
WBC: 17 10*3/uL — ABNORMAL HIGH (ref 4.0–10.5)
nRBC: 0 % (ref 0.0–0.2)

## 2022-03-05 MED ORDER — ATENOLOL 25 MG PO TABS: 25.0000 mg | ORAL_TABLET | Freq: Every day | ORAL | 1 refills | Status: DC

## 2022-03-05 MED ORDER — CHLORTHALIDONE 50 MG PO TABS: 50.0000 mg | ORAL_TABLET | Freq: Every day | ORAL | 0 refills | Status: DC

## 2022-03-05 MED ORDER — ISOSORBIDE MONONITRATE ER 30 MG PO TB24: 30.0000 mg | ORAL_TABLET | Freq: Every day | ORAL | 0 refills | Status: DC

## 2022-03-05 NOTE — Progress Notes (Signed)
Physical Therapy Treatment Patient Details Name: MALEIGH BAGOT MRN: [id number] DOB: 1956-04-22 Today's Date: 03/05/2022   History of Present Illness Per MD: Ms. Gregory Dowe is a 66 year old female with history of hypertension, hyperlipidemia, erythrocytosis, anxiety, hypothyroid, CKD stage IIIa, obesity, tobacco use, non-insulin-dependent diabetes mellitus, who presents to the emergency department for chief concerns of shortness of breath.    PT Comments    Pt is making good progress towards goals with ability to ambulate in hallway, however fatigues quickly and requests to return back to room. All mobility performed on 3L of O2 with sats decreasing to 87% with exertion and cues for PLB. Standing there-ex performed. Pt agreeable to HHPT with plans to dc today. WIll continue to progress.   Recommendations for follow up therapy are one component of a multi-disciplinary discharge planning process, led by the attending physician.  Recommendations may be updated based on patient status, additional functional criteria and insurance authorization.  Follow Up Recommendations  Home health PT     Assistance Recommended at Discharge PRN  Patient can return home with the following A little help with walking and/or transfers;A little help with bathing/dressing/bathroom;Assistance with cooking/housework;Assist for transportation;Help with stairs or ramp for entrance   Equipment Recommendations  None recommended by PT    Recommendations for Other Services       Precautions / Restrictions Precautions Precautions: Fall Restrictions Weight Bearing Restrictions: No     Mobility  Bed Mobility Overal bed mobility: Modified Independent             General bed mobility comments: safe technique with upright posture    Transfers Overall transfer level: Needs assistance Equipment used: None Transfers: Sit to/from Stand Sit to Stand: Supervision           General transfer comment:  safe technique. No dizziness once standing    Ambulation/Gait Ambulation/Gait assistance: Supervision Gait Distance (Feet): 75 Feet Assistive device: None Gait Pattern/deviations: Step-through pattern       General Gait Details: cautious and slightly unsteady gait pattern. No AD used however pt reaching out for railing in hallway and does sway with close guard required. Fatigues quickly and all mobility performed on 3L of O2 with sats decreasing to 87% with exertion. Cues for PLB   Stairs             Wheelchair Mobility    Modified Rankin (Stroke Patients Only)       Balance Overall balance assessment: Mild deficits observed, not formally tested                                          Cognition Arousal/Alertness: Awake/alert Behavior During Therapy: WFL for tasks assessed/performed Overall Cognitive Status: Within Functional Limits for tasks assessed                                          Exercises Other Exercises Other Exercises: standing ther-ex performed on B LE including alt marching and heel raises. 10 reps with supervision. Fatigues quickly    General Comments        Pertinent Vitals/Pain Pain Assessment Pain Assessment: No/denies pain    Home Living  Prior Function            PT Goals (current goals can now be found in the care plan section) Acute Rehab PT Goals Patient Stated Goal: to get breathing under control. PT Goal Formulation: With patient Time For Goal Achievement: 03/16/22 Potential to Achieve Goals: Good Progress towards PT goals: Progressing toward goals    Frequency    Min 2X/week      PT Plan Current plan remains appropriate    Co-evaluation              AM-PAC PT "6 Clicks" Mobility   Outcome Measure  Help needed turning from your back to your side while in a flat bed without using bedrails?: None Help needed moving from lying on your  back to sitting on the side of a flat bed without using bedrails?: None Help needed moving to and from a bed to a chair (including a wheelchair)?: None Help needed standing up from a chair using your arms (e.g., wheelchair or bedside chair)?: None Help needed to walk in hospital room?: A Little Help needed climbing 3-5 steps with a railing? : A Little 6 Click Score: 22    End of Session Equipment Utilized During Treatment: Oxygen Activity Tolerance: Patient limited by fatigue Patient left: in bed (left seated at EOB) Nurse Communication: Mobility status;Other (comment) PT Visit Diagnosis: Unsteadiness on feet (R26.81);Other abnormalities of gait and mobility (R26.89);Muscle weakness (generalized) (M62.81)     Time: 0017-4944 PT Time Calculation (min) (ACUTE ONLY): 24 min  Charges:  $Gait Training: 8-22 mins $Therapeutic Exercise: 8-22 mins                     Greggory Stallion, PT, DPT, GCS [phone]    Tamir Wallman 03/05/2022, 1:06 PM

## 2022-03-05 NOTE — TOC Transition Note (Signed)
Transition of Care [facility]) - CM/SW Discharge Note   Patient Details  Name: Helen Benson MRN: [id number] Date of Birth: 1956/01/08  [facility] CM/SW Contact:  Alberteen Sam, LCSW Phone Number: 03/05/2022, 1:15 PM   Clinical Narrative:     Patient to dc today with [facility] through [place] [facility]. O2 previously ordered 10/7 via [employer].   No further discharge needs identified.   Final next level of care: [place] Barriers to Discharge: No Barriers Identified   Patient Goals and CMS Choice Patient states their goals for this hospitalization and ongoing recovery are:: to go home CMS Medicare.gov Compare Post Acute Care list provided to:: Patient Choice offered to / list presented to : Patient  Discharge Placement                    Patient and family notified of of transfer: 03/03/22 (Could not reach daughter. Spoke with pt directly on [place] arrangements)  Discharge Plan and Services   Discharge Planning Services: CM Consult            DME Arranged: N/A (incorrect documentation NO CPM machine for this pt.) DME Agency: [employer] Date DME Agency Contacted: 03/02/22 Time DME Agency Contacted: [id number] Representative spoke with at DME Agency: [place]: [name]: [place] ([place]) Date [place]: 03/03/22 Time [place]: [phone] Representative spoke with at [place]: [name]     Readmission Risk Interventions     No data to display

## 2022-03-14 ENCOUNTER — Other Ambulatory Visit: Payer: Self-pay | Admitting: Internal Medicine

## 2022-03-14 ENCOUNTER — Ambulatory Visit (INDEPENDENT_AMBULATORY_CARE_PROVIDER_SITE_OTHER): Payer: Medicare Other | Admitting: Nurse Practitioner

## 2022-03-14 ENCOUNTER — Encounter: Payer: Self-pay | Admitting: Nurse Practitioner

## 2022-03-14 VITALS — BP 137/67 | HR 68 | Ht 63.0 in | Wt 166.9 lb

## 2022-03-14 DIAGNOSIS — B029 Zoster without complications: Secondary | ICD-10-CM

## 2022-03-14 DIAGNOSIS — F419 Anxiety disorder, unspecified: Secondary | ICD-10-CM | POA: Diagnosis not present

## 2022-03-14 DIAGNOSIS — J9601 Acute respiratory failure with hypoxia: Secondary | ICD-10-CM

## 2022-03-14 MED ORDER — FINGERTIP PULSE OXIMETER MISC: 1.0000 | 0 refills | Status: AC

## 2022-03-14 MED ORDER — BENZONATATE 100 MG PO CAPS: 100.0000 mg | ORAL_CAPSULE | Freq: Two times a day (BID) | ORAL | 0 refills | Status: DC | PRN

## 2022-03-14 MED ORDER — VALACYCLOVIR HCL 500 MG PO TABS: 500.0000 mg | ORAL_TABLET | Freq: Three times a day (TID) | ORAL | 0 refills | Status: AC

## 2022-03-14 MED ORDER — ALPRAZOLAM 0.5 MG PO TABS: 0.5000 mg | ORAL_TABLET | Freq: Two times a day (BID) | ORAL | 0 refills | Status: DC | PRN

## 2022-03-14 NOTE — Progress Notes (Signed)
Established Patient Office Visit  Subjective:  Patient ID: Helen Benson, female    DOB: Sep 28, 1955  Age: 66 y.o. MRN: [id number]  CC:  Chief Complaint  Patient presents with   Hospitalization Follow-up    Patient here today after recent hospital admission. Admitted on 02/26/22 and discharged on 03/05/22.     HPI  Helen Benson presents for hospital follow up.  She was discharged from the hospital on 03/05/2022 She is on 2 L of oxygen at home. She still complaints of cough.  She also have rash on the left side of her buttocks and back with crusting.  HPI   Past Medical History:  Diagnosis Date   Erythrocytosis    Hyperlipidemia    Hypertension    Kidney damage    Type 2 diabetes mellitus ([place])     Past Surgical History:  Procedure Laterality Date   BREAST CYST ASPIRATION Bilateral    Bilat. many on both per pt   BREAST CYST EXCISION Left    age 20   JOINT REPLACEMENT      Family History  Problem Relation Age of Onset   Breast cancer Sister 76   Breast cancer Other 62       neice    Social History   Socioeconomic History   Marital status: Single    Spouse name: Not on file   Number of children: Not on file   Years of education: Not on file   Highest education level: Not on file  Occupational History   Not on file  Tobacco Use   Smoking status: Former    Packs/day: 1.50    Years: 35.00    Total pack years: 52.50    Types: Cigarettes    Quit date: 02/26/2022    Years since quitting: 0.0   Smokeless tobacco: Never  Substance and Sexual Activity   Alcohol use: No   Drug use: Never   Sexual activity: Not Currently  Other Topics Concern   Not on file  Social History Narrative   Not on file   Social Determinants of Health   Financial Resource Strain: Not on file  Food Insecurity: Not on file  Transportation Needs: Not on file  Physical Activity: Not on file  Stress: Not on file  Social Connections: Not on file  Intimate Partner Violence: Not  on file     Outpatient Medications Prior to Visit  Medication Sig Dispense Refill   chlorthalidone (HYGROTON) 50 MG tablet Take 1 tablet (50 mg total) by mouth daily. 30 tablet 0   Cholecalciferol (VITAMIN D-3 PO) Take 1 capsule by mouth daily at 2 PM.     FARXIGA 10 MG TABS tablet Take 10 mg by mouth daily.     levothyroxine (SYNTHROID) 75 MCG tablet Take 1 tablet (75 mcg total) by mouth daily before breakfast. 90 tablet 1   Loratadine (ALLERGY RELIEF 24-HR PO) Take 1 tablet by mouth daily at 2 PM.     losartan (COZAAR) 100 MG tablet Take 1 tablet (100 mg total) by mouth daily. 30 tablet 0   naloxone (NARCAN) nasal spray 4 mg/0.1 mL      Oxycodone HCl 10 MG TABS Take 10 mg by mouth 3 (three) times daily.     rosuvastatin (CRESTOR) 20 MG tablet Take 20 mg by mouth daily.     ALPRAZolam (XANAX) 0.5 MG tablet Take 1 tablet (0.5 mg total) by mouth 2 (two) times daily as needed for anxiety. 60 tablet  0   Ipratropium-Albuterol (COMBIVENT) 20-100 MCG/ACT AERS respimat Inhale 1 puff into the lungs every 6 (six) hours as needed for wheezing or shortness of breath. (Patient not taking: Reported on 03/21/2022) 4 g 0   atenolol (TENORMIN) 25 MG tablet Take 1 tablet (25 mg total) by mouth daily. Hold this medication until you are able to see your PCP. Medication was held due intermittent bradycardia (Patient not taking: Reported on 03/14/2022) 90 tablet 1   isosorbide mononitrate (IMDUR) 30 MG 24 hr tablet Take 1 tablet (30 mg total) by mouth daily. (Patient not taking: Reported on 03/14/2022) 30 tablet 0   oxyCODONE ER (XTAMPZA ER) 18 MG C12A Xtampza ER 18 mg capsule sprinkle     predniSONE (DELTASONE) 10 MG tablet 16m daily x 2 days, 538mdaily x 2 days, 4062maily x 2 days, 72m20mily x 2 days, 20mg83mly x 2 days, 10mg 48my x 2 days then stop 42 tablet 0   No facility-administered medications prior to visit.    Allergies  Allergen Reactions   Gabapentin Other (See Comments)    Had psychotic  episodes      ROS Review of Systems  Constitutional: Negative.  Negative for fatigue.  HENT: Negative.    Eyes: Negative.   Respiratory:  Positive for cough.   Cardiovascular:  Negative for chest pain.  Gastrointestinal: Negative.   Genitourinary: Negative.   Musculoskeletal: Negative.   Skin:  Positive for rash.  Neurological:  Negative for dizziness, facial asymmetry and headaches.  Psychiatric/Behavioral:  Negative for agitation, behavioral problems and confusion.       Objective:    Physical Exam Constitutional:      Appearance: Normal appearance. She is obese.  HENT:     Head: Normocephalic.     Right Ear: Tympanic membrane normal.     Left Ear: Tympanic membrane normal.     Nose: Nose normal.     Mouth/Throat:     Mouth: Mucous membranes are moist.     Pharynx: Oropharynx is clear.  Eyes:     Extraocular Movements: Extraocular movements intact.     Conjunctiva/sclera: Conjunctivae normal.     Pupils: Pupils are equal, round, and reactive to light.  Cardiovascular:     Rate and Rhythm: Normal rate and regular rhythm.     Pulses: Normal pulses.     Heart sounds: Normal heart sounds.  Pulmonary:     Effort: Pulmonary effort is normal. No respiratory distress.     Breath sounds: Normal breath sounds. No stridor. No wheezing or rhonchi.  Abdominal:     General: Bowel sounds are normal.     Palpations: Abdomen is soft. There is no mass.     Tenderness: There is no abdominal tenderness.     Hernia: No hernia is present.  Musculoskeletal:        General: Normal range of motion.     Cervical back: Neck supple. No tenderness.  Skin:    Capillary Refill: Capillary refill takes less than 2 seconds.     Findings: Rash present. Rash is crusting.  Neurological:     General: No focal deficit present.     Mental Status: She is alert and oriented to person, place, and time. Mental status is at baseline.  Psychiatric:        Mood and Affect: Mood normal.        Behavior:  Behavior normal.        Thought Content: Thought content normal.  Judgment: Judgment normal.     BP 137/67   Pulse 68   Ht '5\' 3"'  (1.6 m)   Wt 166 lb 14.4 oz (75.7 kg)   SpO2 98%   BMI 29.57 kg/m  Wt Readings from Last 3 Encounters:  03/21/22 167 lb 12.8 oz (76.1 kg)  03/14/22 166 lb 14.4 oz (75.7 kg)  02/26/22 160 lb (72.6 kg)     Health Maintenance  Topic Date Due   Medicare Annual Wellness (AWV)  Never done   FOOT EXAM  Never done   Diabetic kidney evaluation - Urine ACR  01/06/2019   TETANUS/TDAP  11/25/2020   MAMMOGRAM  04/28/2021   INFLUENZA VACCINE  04/14/2022 (Originally 12/25/2021)   COLONOSCOPY (Pts 45-19yr Insurance coverage will need to be confirmed)  05/20/2022 (Originally 08/19/2000)   Pneumonia Vaccine 66 Years old (1 - PCV) 01/04/2023 (Originally 08/19/1961)   COVID-19 Vaccine (1) 03/27/2023 (Originally 02[birth year]20/1956)   Zoster Vaccines- Shingrix (1 of 2) 03/27/2023 (Originally 08/19/2005)   OPHTHALMOLOGY EXAM  05/27/2022   HEMOGLOBIN A1C  07/06/2022   Lung Cancer Screening  02/27/2023   Diabetic kidney evaluation - GFR measurement  03/06/2023   DEXA SCAN  Completed   Hepatitis C Screening  Completed   HPV VACCINES  Aged Out    There are no preventive care reminders to display for this patient.  Lab Results  Component Value Date   TSH 1.115 02/26/2022   Lab Results  Component Value Date   WBC 17.0 (H) 03/05/2022   HGB 17.4 (H) 03/05/2022   HCT 52.8 (H) 03/05/2022   MCV 92.8 03/05/2022   PLT 275 03/05/2022   Lab Results  Component Value Date   NA 140 03/05/2022   K 4.0 03/05/2022   CO2 31 03/05/2022   GLUCOSE 127 (H) 03/05/2022   BUN 57 (H) 03/05/2022   CREATININE 0.99 03/05/2022   BILITOT 1.3 (H) 02/26/2022   ALKPHOS 57 02/26/2022   AST 30 02/26/2022   ALT 19 02/26/2022   PROT 7.8 02/26/2022   ALBUMIN 4.2 02/26/2022   CALCIUM 9.3 03/05/2022   ANIONGAP 7 03/05/2022   EGFR 59 (L) 01/03/2022   Lab Results  Component Value Date    CHOL 82 02/27/2022   Lab Results  Component Value Date   HDL 29 (L) 02/27/2022   Lab Results  Component Value Date   LDLCALC 39 02/27/2022   Lab Results  Component Value Date   TRIG 69 02/27/2022   Lab Results  Component Value Date   CHOLHDL 2.8 02/27/2022   Lab Results  Component Value Date   HGBA1C 6.1 (H) 01/03/2022      Assessment & Plan:   Problem List Items Addressed This Visit       Respiratory   Acute hypoxemic respiratory failure (HCC) - Primary    Continue 2 L of oxygen at home. Would refer her to pulmonologist. Continue the current medication.      Relevant Orders   Ambulatory referral to Pulmonology     Other   Anxiety    Stable on medication. Refilled Xanax 0.5 mg      Herpes zoster without complication    Started her on Valtrex 500 mg. We will continue to monitor.      Relevant Medications   valACYclovir (VALTREX) 500 MG tablet     Meds ordered this encounter  Medications   DISCONTD: ALPRAZolam (XANAX) 0.5 MG tablet    Sig: Take 1 tablet (0.5 mg total) by mouth 2 (two) times  daily as needed for anxiety.    Dispense:  60 tablet    Refill:  0   DISCONTD: benzonatate (TESSALON) 100 MG capsule    Sig: Take 1 capsule (100 mg total) by mouth 2 (two) times daily as needed for cough.    Dispense:  20 capsule    Refill:  0   valACYclovir (VALTREX) 500 MG tablet    Sig: Take 1 tablet (500 mg total) by mouth 3 (three) times daily.    Dispense:  21 tablet    Refill:  0   Misc. Devices (FINGERTIP PULSE OXIMETER) MISC    Sig: 1 each by Does not apply route as directed.    Dispense:  1 each    Refill:  0     Follow-up: No follow-ups on file.    Theresia Lo, NP

## 2022-03-20 NOTE — Progress Notes (Unsigned)
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  Past Medical History    Past Medical History:  Diagnosis Date   Erythrocytosis    Hyperlipidemia    Hypertension    Kidney damage    Type 2 diabetes mellitus ([place])    Past Surgical History:  Procedure Laterality Date   BREAST CYST ASPIRATION Bilateral    Bilat. 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  She presented to the [facility] emergency department on 02/26/2022 with complaints of shortness of breath and was found to have oxygen saturation of 62% on room air.  She been having cold symptoms for approximately 3 days prior to arriving to the emergency department.  She initially had loose stools and subsequently developed cough and sore throat.  She states the symptoms that she was having similar to prior infection in 2019 and 2020 that she attributes to COVID-19.  When she was found to be quite hypoxic she was placed on 6 L of supplemental oxygen via nasal cannula and oxygen saturations were in the upper 80s and low 90s.  She denied any chest pain, palpitations, lightheadedness, or edema.  She also noted sick contacts of a friend who recently had an upper respiratory infection.  She was diagnosed with acute  bronchitis, rhinovirus/enterovirus.  Required high flow oxygen therapy and BiPAP in the setting of acute hypoxic respiratory failure.  She was discharged from the facility on 03/02/2022 sent home with oxygen therapy.  Echocardiogram was completed and revealed LVEF of 70-75%, no regional wall motion abnormality, G2 DD, there is mitral valve severe annular calcification without evidence of regurgitation  She returns to clinic today accompanied by a friend.  She continues to have shortness of breath and dyspnea on exertion but denies any recurrent chest pain.  Is asking when she can come off of her oxygen via nasal cannula today.  She has also not started taking the Imdur that she was previously receiving as she stated she has felt like that she has not needed it at this time.  She is also no longer taking atenolol that she had been on for several years for blood pressure control due to episodic periods of bradycardia during her hospitalization.  She denies any current chest pain or peripheral edema, lightheadedness, dizziness, syncope or near syncope.  She denies any recurrent visits to the hospital or the emergency department.  Home Medications    Current Outpatient Medications  Medication Sig Dispense Refill   ALPRAZolam (XANAX) 0.5 MG tablet TAKE 1 TABLET BY MOUTH TWICE DAILY AS NEEDED FOR ANXIETY 60 tablet 0   chlorthalidone (HYGROTON) 50 MG tablet Take 1 tablet (50 mg total) by mouth daily. 30 tablet 0   Cholecalciferol (VITAMIN D-3 PO) Take 1 capsule by  mouth daily at 2 PM.     FARXIGA 10 MG TABS tablet Take 10 mg by mouth daily.     levothyroxine (SYNTHROID) 75 MCG tablet Take 1 tablet (75 mcg total) by mouth daily before breakfast. 90 tablet 1   Loratadine (ALLERGY RELIEF 24-HR PO) Take 1 tablet by mouth daily at 2 PM.     losartan (COZAAR) 100 MG tablet Take 1 tablet (100 mg total) by mouth daily. 30 tablet 0   Misc. Devices (FINGERTIP PULSE OXIMETER) MISC 1 each by Does not apply route as  directed. 1 each 0   naloxone (NARCAN) nasal spray 4 mg/0.1 mL      Oxycodone HCl 10 MG TABS Take 10 mg by mouth 3 (three) times daily.     rosuvastatin (CRESTOR) 20 MG tablet Take 20 mg by mouth daily.     valACYclovir (VALTREX) 500 MG tablet Take 1 tablet (500 mg total) by mouth 3 (three) times daily. 21 tablet 0   No current facility-administered medications for this visit.     Family History    Family History  Problem Relation Age of Onset   Breast cancer Sister 45   Breast cancer Other 9       neice   She indicated that the status of her sister is unknown. She indicated that the status of her other is unknown.  Social History    Social History   Socioeconomic History   Marital status: Single    Spouse name: Not on file   Number of children: Not on file   Years of education: Not on file   Highest education level: Not on file  Occupational History   Not on file  Tobacco Use   Smoking status: Former    Packs/day: 1.50    Years: 35.00    Total pack years: 52.50    Types: Cigarettes    Quit date: 02/26/2022    Years since quitting: 0.0   Smokeless tobacco: Never  Substance and Sexual Activity   Alcohol use: No   Drug use: Never   Sexual activity: Not Currently  Other Topics Concern   Not on file  Social History Narrative   Not on file   Social Determinants of Health   Financial Resource Strain: Not on file  Food Insecurity: Not on file  Transportation Needs: Not on file  Physical Activity: Not on file  Stress: Not on file  Social Connections: Not on file  Intimate Partner Violence: Not on file     Review of Systems    General:  No chills, fever, night sweats or weight changes.  Cardiovascular:  No chest pain, endorses dyspnea on exertion, edema, orthopnea, palpitations, paroxysmal nocturnal dyspnea. Dermatological: No rash, lesions/masses Respiratory: No cough, endorses dyspnea Urologic: No hematuria, dysuria Abdominal:   No nausea, vomiting,  diarrhea, bright red blood per rectum, melena, or hematemesis Neurologic:  No visual changes, wkns, changes in mental status. All other systems reviewed and are otherwise negative except as noted above.   Physical Exam    VS:  BP 108/60 (BP Location: Left Arm, Patient Position: Sitting, Cuff Size: Normal)   Pulse 70   Ht 5' 3.5" (1.613 m)   Wt 167 lb 12.8 oz (76.1 kg)   SpO2 98%   BMI 29.26 kg/m  , BMI Body mass index is 29.26 kg/m.     GEN: Well nourished, well developed, in no acute distress. HEENT: normal. Neck: Supple, no JVD, carotid bruits, or masses. Cardiac: RRR,  no murmurs, rubs, or gallops. No clubbing, cyanosis, edema.  Radials/DP/PT 2+ and equal bilaterally.  Respiratory:  Respirations regular and unlabored, mildly diminished to auscultation bilaterally.  Unlabored at rest on 2 L of O2 via nasal cannula GI: Soft, nontender, nondistended, BS + x 4. MS: no deformity or atrophy. Skin: warm and dry, no rash. Neuro:  Strength and sensation are intact. Psych: Normal affect.  Accessory Clinical Findings    ECG personally reviewed by me today-sinus rhythm with a rate of 70- No acute changes  Lab Results  Component Value Date   WBC 17.0 (H) 03/05/2022   HGB 17.4 (H) 03/05/2022   HCT 52.8 (H) 03/05/2022   MCV 92.8 03/05/2022   PLT 275 03/05/2022   Lab Results  Component Value Date   CREATININE 0.99 03/05/2022   BUN 57 (H) 03/05/2022   NA 140 03/05/2022   K 4.0 03/05/2022   CL 102 03/05/2022   CO2 31 03/05/2022   Lab Results  Component Value Date   ALT 19 02/26/2022   AST 30 02/26/2022   ALKPHOS 57 02/26/2022   BILITOT 1.3 (H) 02/26/2022   Lab Results  Component Value Date   CHOL 82 02/27/2022   HDL 29 (L) 02/27/2022   LDLCALC 39 02/27/2022   TRIG 69 02/27/2022   CHOLHDL 2.8 02/27/2022    Lab Results  Component Value Date   HGBA1C 6.1 (H) 01/03/2022    Assessment & Plan   1.  Coronary calcification noted on chest CT with elevated high-sensitivity  troponin during recent hospitalization.  Peaking at 114.  Likely consistent with supply/demand ischemia in the send acute hypoxic respiratory failure.  Echocardiogram was completed with preserved LV systolic function and normal wall motion.  She was maintained on IV heparin for 48 hours during her hospitalization and returns today stating that she has been relatively chest pain-free but continues to have a dyspnea on exertion which can be anginal equivalent.  She has been scheduled for Lexiscan stress testing.  2. Hypertension with blood pressure today of 108/60.  She has been continued on losartan 100 mg daily and chlorthalidone 50 mg daily her atenolol remains on hold due to soft blood pressure and recurrent bradycardia episodes during her hospitalization.  3.  Hyperlipidemia with an LDL of 39 checked on 02/27/2022 she is continued on rosuvastatin 20 mg daily  4.  Type 2 diabetes with a hemoglobin A1c of 6.1 this is managed by her PCP  5.  CKD with AKI during hospitalization follow-up BMP revealed serum creatinine 0.99 with a BUN of 57 2 GFR greater than 60.  Last blood work was done 03/05/2022.  She continue to follow with her PCP for this.  She states that previously in the past and the reason she had had issues with her kidneys with large quantity of blood pressure medications and she required.  We advised her to ensure she maintains hydration as well.  6.  Patient return to clinic to see MD/APP after testing is completed in 3 months or sooner if needed.  Sayeed Weatherall, NP 03/21/2022, 10:05 AM

## 2022-03-21 ENCOUNTER — Encounter: Payer: Self-pay | Admitting: Cardiology

## 2022-03-21 ENCOUNTER — Ambulatory Visit: Payer: Medicare Other | Attending: Cardiology | Admitting: Cardiology

## 2022-03-21 VITALS — BP 108/60 | HR 70 | Ht 63.5 in | Wt 167.8 lb

## 2022-03-21 DIAGNOSIS — R7989 Other specified abnormal findings of blood chemistry: Secondary | ICD-10-CM | POA: Diagnosis present

## 2022-03-21 DIAGNOSIS — R079 Chest pain, unspecified: Secondary | ICD-10-CM | POA: Insufficient documentation

## 2022-03-21 DIAGNOSIS — N1831 Chronic kidney disease, stage 3a: Secondary | ICD-10-CM | POA: Insufficient documentation

## 2022-03-21 DIAGNOSIS — N184 Chronic kidney disease, stage 4 (severe): Secondary | ICD-10-CM | POA: Insufficient documentation

## 2022-03-21 DIAGNOSIS — E782 Mixed hyperlipidemia: Secondary | ICD-10-CM | POA: Insufficient documentation

## 2022-03-21 DIAGNOSIS — I10 Essential (primary) hypertension: Secondary | ICD-10-CM | POA: Insufficient documentation

## 2022-03-21 NOTE — Patient Instructions (Signed)
Medication Instructions:  Your physician recommends that you continue on your current medications as directed. Please refer to the Current Medication list given to you today.  *If you need a refill on your cardiac medications before your next appointment, please call your pharmacy*  Lab Work: NONE If you have labs (blood work) drawn today and your tests are completely normal, you will receive your results only by: [place] (if you have MyChart) OR A paper copy in the mail If you have any lab test that is abnormal or we need to change your treatment, we will call you to review the results.  Testing/Procedures: Leane Call Stress test Your physician has requested that you have a lexiscan myoview. For further information please visit [email or web address]. Please follow instruction sheet, as given.  Follow-Up: At [facility], you and your health needs are our priority.  As part of our continuing mission to provide you with exceptional heart care, we have created designated Provider Care Teams.  These Care Teams include your primary Cardiologist (physician) and Advanced Practice Providers (APPs -  Physician Assistants and Nurse Practitioners) who all work together to provide you with the care you need, when you need it.  We recommend signing up for the patient portal called "MyChart".  Sign up information is provided on this After Visit Summary.  MyChart is used to connect with patients for Virtual Visits (Telemedicine).  Patients are able to view lab/test results, encounter notes, upcoming appointments, etc.  Non-urgent messages can be sent to your provider as well.   To learn more about what you can do with MyChart, go to [email or web address].    Your next appointment:   3 month(s)  The format for your next appointment:   In Person  Provider:   Gerrie Nordmann, NP    Other Instructions See separate sheet for stress test instructions  Important Information About  [place]

## 2022-03-26 ENCOUNTER — Encounter
Admission: RE | Admit: 2022-03-26 | Discharge: 2022-03-26 | Disposition: A | Discharge status: Home / Self Care | Payer: Medicare Other | Source: Ambulatory Visit | Attending: Cardiology | Admitting: Cardiology

## 2022-03-26 ENCOUNTER — Encounter: Payer: Self-pay | Admitting: Nurse Practitioner

## 2022-03-26 DIAGNOSIS — R079 Chest pain, unspecified: Secondary | ICD-10-CM | POA: Insufficient documentation

## 2022-03-26 DIAGNOSIS — R7989 Other specified abnormal findings of blood chemistry: Secondary | ICD-10-CM | POA: Diagnosis present

## 2022-03-26 DIAGNOSIS — B029 Zoster without complications: Secondary | ICD-10-CM | POA: Insufficient documentation

## 2022-03-26 MED ORDER — TECHNETIUM TC 99M TETROFOSMIN IV KIT
10.1500 | PACK | Freq: Once | INTRAVENOUS | Status: AC | PRN
  Administered 2022-03-26: 10.15 via INTRAVENOUS

## 2022-03-26 MED ORDER — REGADENOSON 0.4 MG/5ML IV SOLN
0.4000 mg | Freq: Once | INTRAVENOUS | Status: AC
  Administered 2022-03-26: 0.4 mg via INTRAVENOUS

## 2022-03-26 MED ORDER — TECHNETIUM TC 99M TETROFOSMIN IV KIT
31.7900 | PACK | Freq: Once | INTRAVENOUS | Status: AC | PRN
  Administered 2022-03-26: 31.79 via INTRAVENOUS

## 2022-03-26 NOTE — Assessment & Plan Note (Signed)
Started her on Valtrex 500 mg. We will continue to monitor.

## 2022-03-26 NOTE — Assessment & Plan Note (Signed)
Continue 2 L of oxygen at home. Would refer her to pulmonologist. Continue the current medication.

## 2022-03-26 NOTE — Assessment & Plan Note (Signed)
Stable on medication. Refilled Xanax 0.5 mg

## 2022-03-27 ENCOUNTER — Telehealth: Payer: Self-pay

## 2022-03-27 ENCOUNTER — Other Ambulatory Visit: Payer: Self-pay

## 2022-03-27 DIAGNOSIS — Z1211 Encounter for screening for malignant neoplasm of colon: Secondary | ICD-10-CM

## 2022-03-27 LAB — NM MYOCAR MULTI W/SPECT W/WALL MOTION / EF
LV dias vol: 44 mL (ref 46–106)
LV sys vol: 14 mL
Nuc Stress EF: 68 %
Peak HR: 82 {beats}/min
Percent HR: 53 %
Rest HR: 67 {beats}/min
Rest Nuclear Isotope Dose: 10.2 mCi
SDS: 0
SRS: 0
SSS: 0
ST Depression (mm): 0 mm
Stress Nuclear Isotope Dose: 31.8 mCi
TID: 0.85

## 2022-03-27 MED ORDER — NA SULFATE-K SULFATE-MG SULF 17.5-3.13-1.6 GM/177ML PO SOLN: 1.0000 | Freq: Once | ORAL | 0 refills | Status: AC

## 2022-03-27 NOTE — Telephone Encounter (Signed)
Gastroenterology Pre-Procedure Review  Request Date: 04/23/22 Requesting Physician: Dr. Vicente Males  PATIENT REVIEW QUESTIONS: The patient responded to the following health history questions as indicated:    1. Are you having any GI issues? no 2. Do you have a personal history of Polyps? no 3. Do you have a family history of Colon Cancer or Polyps? nod 4. Diabetes Mellitus? yes (patient has been advised to hold Farxiga 3 days prior to colonoscopy) 5. Joint replacements in the past 12 months?no 6. 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 Any artificial heart valves, MVP, or defibrillator?no however, patient was recently seen by NP Gerrie Nordmann for Chest Pain-cardiac clearance will be sent to her office    MEDICATIONS & ALLERGIES:    Patient reports the following regarding taking any anticoagulation/antiplatelet therapy:   Plavix, Coumadin, Eliquis, Xarelto, Lovenox, Pradaxa, Brilinta, or Effient? no Aspirin? no  Patient confirms/reports the following medications:  Current Outpatient Medications  Medication Sig Dispense Refill   ALPRAZolam (XANAX) 0.5 MG tablet TAKE 1 TABLET BY MOUTH TWICE DAILY AS NEEDED FOR ANXIETY 60 tablet 0   chlorthalidone (HYGROTON) 50 MG tablet Take 1 tablet (50 mg total) by mouth daily. 30 tablet 0   Cholecalciferol (VITAMIN D-3 PO) Take 1 capsule by mouth daily at 2 PM.     FARXIGA 10 MG TABS tablet Take 10 mg by mouth daily.     levothyroxine (SYNTHROID) 75 MCG tablet Take 1 tablet (75 mcg total) by mouth daily before breakfast. 90 tablet 1   Loratadine (ALLERGY RELIEF 24-HR PO) Take 1 tablet by mouth daily at 2 PM.     losartan (COZAAR) 100 MG tablet Take 1 tablet (100 mg total) by mouth daily. 30 tablet 0   Misc. Devices (FINGERTIP PULSE OXIMETER) MISC 1 each by Does not apply route as directed. 1 each 0   naloxone (NARCAN) nasal spray 4 mg/0.1 mL      Oxycodone HCl 10 MG TABS Take 10 mg by  mouth 3 (three) times daily.     rosuvastatin (CRESTOR) 20 MG tablet Take 20 mg by mouth daily.     valACYclovir (VALTREX) 500 MG tablet Take 1 tablet (500 mg total) by mouth 3 (three) times daily. 21 tablet 0   No current facility-administered medications for this visit.    Patient confirms/reports the following allergies:  Allergies  Allergen Reactions   Gabapentin Other (See Comments)    Had psychotic episodes      No orders of the defined types were placed in this encounter.   AUTHORIZATION INFORMATION Primary Insurance: 1D#: Group #:  Secondary Insurance: 1D#: Group #:  SCHEDULE INFORMATION: Date: 04/23/22 Time: Location: [place]

## 2022-04-01 ENCOUNTER — Other Ambulatory Visit: Payer: Self-pay

## 2022-04-01 MED ORDER — LOSARTAN POTASSIUM 100 MG PO TABS: 100.0000 mg | ORAL_TABLET | Freq: Every day | ORAL | 0 refills | Status: DC

## 2022-04-05 ENCOUNTER — Ambulatory Visit (INDEPENDENT_AMBULATORY_CARE_PROVIDER_SITE_OTHER): Payer: Medicare Other | Admitting: Nurse Practitioner

## 2022-04-05 ENCOUNTER — Encounter: Payer: Self-pay | Admitting: Nurse Practitioner

## 2022-04-05 VITALS — BP 138/78 | HR 77 | Temp 98.3°F | Ht 63.5 in | Wt 168.5 lb

## 2022-04-05 DIAGNOSIS — E039 Hypothyroidism, unspecified: Secondary | ICD-10-CM | POA: Diagnosis not present

## 2022-04-05 DIAGNOSIS — F419 Anxiety disorder, unspecified: Secondary | ICD-10-CM | POA: Diagnosis not present

## 2022-04-05 DIAGNOSIS — I10 Essential (primary) hypertension: Secondary | ICD-10-CM | POA: Diagnosis not present

## 2022-04-05 DIAGNOSIS — Z23 Encounter for immunization: Secondary | ICD-10-CM | POA: Diagnosis not present

## 2022-04-05 DIAGNOSIS — E139 Other specified diabetes mellitus without complications: Secondary | ICD-10-CM | POA: Diagnosis not present

## 2022-04-05 MED ORDER — FARXIGA 10 MG PO TABS: 10.0000 mg | ORAL_TABLET | Freq: Every day | ORAL | 2 refills | Status: DC

## 2022-04-05 MED ORDER — ALPRAZOLAM 0.5 MG PO TABS: 0.5000 mg | ORAL_TABLET | Freq: Two times a day (BID) | ORAL | 0 refills | Status: DC | PRN

## 2022-04-05 MED ORDER — ROSUVASTATIN CALCIUM 20 MG PO TABS: 20.0000 mg | ORAL_TABLET | Freq: Every day | ORAL | 2 refills | Status: DC

## 2022-04-05 NOTE — Progress Notes (Unsigned)
Established Patient Office Visit  Subjective:  Patient ID: Helen Benson, female    DOB: November 18, 1955  Age: 66 y.o. MRN: [id number]  CC:  Chief Complaint  Patient presents with   Chronic Kidney Disease    3 month fu. Having some problems with memory since episode. She was taken off mediations not sure which ones     HPI  Helen Benson presents for routine follow up . She weaned off herself from the 2L [place] at home about 2 weeks ago. She denies SOB, chest pain. She complaint of some memory loss after the episode of acute hypoxemic respiratory failure.  HPI   Past Medical History:  Diagnosis Date   Erythrocytosis    Hyperlipidemia    Hypertension    Kidney damage    Type 2 diabetes mellitus ([place])     Past Surgical History:  Procedure Laterality Date   BREAST CYST ASPIRATION Bilateral    Bilat. many on both per pt   BREAST CYST EXCISION Left    age 66   JOINT REPLACEMENT      Family History  Problem Relation Age of Onset   Breast cancer Sister 56   Breast cancer Other 32       neice    Social History   Socioeconomic History   Marital status: Single    Spouse name: Not on file   Number of children: Not on file   Years of education: Not on file   Highest education level: Not on file  Occupational History   Not on file  Tobacco Use   Smoking status: Former    Packs/day: 1.50    Years: 35.00    Total pack years: 52.50    Types: Cigarettes    Quit date: 02/26/2022    Years since quitting: 0.1   Smokeless tobacco: Never  Substance and Sexual Activity   Alcohol use: No   Drug use: Never   Sexual activity: Not Currently  Other Topics Concern   Not on file  Social History Narrative   Not on file   Social Determinants of Health   Financial Resource Strain: Not on file  Food Insecurity: Not on file  Transportation Needs: Not on file  Physical Activity: Not on file  Stress: Not on file  Social Connections: Not on file  Intimate Partner Violence: Not  on file     Outpatient Medications Prior to Visit  Medication Sig Dispense Refill   chlorthalidone (HYGROTON) 50 MG tablet Take 1 tablet (50 mg total) by mouth daily. 30 tablet 0   Cholecalciferol (VITAMIN D-3 PO) Take 1 capsule by mouth daily at 2 PM.     levothyroxine (SYNTHROID) 75 MCG tablet Take 1 tablet (75 mcg total) by mouth daily before breakfast. 90 tablet 1   Loratadine (ALLERGY RELIEF 24-HR PO) Take 1 tablet by mouth daily at 2 PM.     losartan (COZAAR) 100 MG tablet Take 1 tablet (100 mg total) by mouth daily. 30 tablet 0   Misc. Devices (FINGERTIP PULSE OXIMETER) MISC 1 each by Does not apply route as directed. 1 each 0   naloxone (NARCAN) nasal spray 4 mg/0.1 mL      Oxycodone HCl 10 MG TABS Take 10 mg by mouth 3 (three) times daily.     valACYclovir (VALTREX) 500 MG tablet Take 1 tablet (500 mg total) by mouth 3 (three) times daily. 21 tablet 0   ALPRAZolam (XANAX) 0.5 MG tablet TAKE 1 TABLET BY MOUTH  TWICE DAILY AS NEEDED FOR ANXIETY 60 tablet 0   FARXIGA 10 MG TABS tablet Take 10 mg by mouth daily.     rosuvastatin (CRESTOR) 20 MG tablet Take 20 mg by mouth daily.     No facility-administered medications prior to visit.    Allergies  Allergen Reactions   Gabapentin Other (See Comments)    Had psychotic episodes      ROS Review of Systems  Constitutional: Negative.   HENT: Negative.    Eyes: Negative.   Respiratory:  Negative for cough, chest tightness and shortness of breath.   Cardiovascular:  Negative for chest pain.  Gastrointestinal: Negative.   Genitourinary: Negative.   Musculoskeletal: Negative.   Neurological:  Negative for dizziness, light-headedness and headaches.  Psychiatric/Behavioral: Negative.        Objective:    Physical Exam Constitutional:      Appearance: Normal appearance. She is overweight.  HENT:     Head: Normocephalic.     Right Ear: Tympanic membrane normal.     Left Ear: Tympanic membrane normal.     Nose: Nose normal.      Mouth/Throat:     Mouth: Mucous membranes are moist.     Pharynx: Oropharynx is clear.  Eyes:     Extraocular Movements: Extraocular movements intact.     Conjunctiva/sclera: Conjunctivae normal.     Pupils: Pupils are equal, round, and reactive to light.  Cardiovascular:     Rate and Rhythm: Normal rate and regular rhythm.     Pulses: Normal pulses.     Heart sounds: Normal heart sounds.  Pulmonary:     Effort: Pulmonary effort is normal. No respiratory distress.     Breath sounds: Normal breath sounds. No rhonchi.  Abdominal:     General: Bowel sounds are normal.     Palpations: Abdomen is soft. There is no mass.     Tenderness: There is no abdominal tenderness.     Hernia: No hernia is present.  Musculoskeletal:        General: Normal range of motion.     Cervical back: Neck supple. No tenderness.  Skin:    General: Skin is warm.     Capillary Refill: Capillary refill takes less than 2 seconds.  Neurological:     General: No focal deficit present.     Mental Status: She is alert and oriented to person, place, and time. Mental status is at baseline.  Psychiatric:        Mood and Affect: Mood normal.        Behavior: Behavior normal.        Thought Content: Thought content normal.        Judgment: Judgment normal.     BP 138/78   Pulse 77   Temp 98.3 F (36.8 C) (Temporal)   Ht 5' 3.5" (1.613 m)   Wt 168 lb 8 oz (76.4 kg)   SpO2 99%   BMI 29.38 kg/m  Wt Readings from Last 3 Encounters:  04/05/22 168 lb 8 oz (76.4 kg)  03/21/22 167 lb 12.8 oz (76.1 kg)  03/14/22 166 lb 14.4 oz (75.7 kg)     Health Maintenance  Topic Date Due   Medicare Annual Wellness (AWV)  Never done   MAMMOGRAM  04/28/2021   COLONOSCOPY (Pts 45-82yr Insurance coverage will need to be confirmed)  05/20/2022 (Originally 08/19/2000)   Pneumonia Vaccine 66 Years old (1 - PCV) 01/04/2023 (Originally 08/19/1961)   Zoster Vaccines- Shingrix (1 of 2)  03/27/2023 (Originally 08/19/2005)    TETANUS/TDAP  04/06/2023 (Originally 11/25/2020)   OPHTHALMOLOGY EXAM  05/27/2022   HEMOGLOBIN A1C  07/06/2022   Lung Cancer Screening  02/27/2023   Diabetic kidney evaluation - GFR measurement  03/06/2023   Diabetic kidney evaluation - Urine ACR  04/06/2023   FOOT EXAM  04/06/2023   INFLUENZA VACCINE  Completed   DEXA SCAN  Completed   Hepatitis C Screening  Completed   HPV VACCINES  Aged Out   COVID-19 Vaccine  Discontinued    There are no preventive care reminders to display for this patient.  Lab Results  Component Value Date   TSH 1.115 02/26/2022   Lab Results  Component Value Date   WBC 17.0 (H) 03/05/2022   HGB 17.4 (H) 03/05/2022   HCT 52.8 (H) 03/05/2022   MCV 92.8 03/05/2022   PLT 275 03/05/2022   Lab Results  Component Value Date   NA 140 03/05/2022   K 4.0 03/05/2022   CO2 31 03/05/2022   GLUCOSE 127 (H) 03/05/2022   BUN 57 (H) 03/05/2022   CREATININE 0.99 03/05/2022   BILITOT 1.3 (H) 02/26/2022   ALKPHOS 57 02/26/2022   AST 30 02/26/2022   ALT 19 02/26/2022   PROT 7.8 02/26/2022   ALBUMIN 4.2 02/26/2022   CALCIUM 9.3 03/05/2022   ANIONGAP 7 03/05/2022   EGFR 59 (L) 01/03/2022   Lab Results  Component Value Date   CHOL 82 02/27/2022   Lab Results  Component Value Date   HDL 29 (L) 02/27/2022   Lab Results  Component Value Date   LDLCALC 39 02/27/2022   Lab Results  Component Value Date   TRIG 69 02/27/2022   Lab Results  Component Value Date   CHOLHDL 2.8 02/27/2022   Lab Results  Component Value Date   HGBA1C 6.1 (H) 01/03/2022      Assessment & Plan:   Problem List Items Addressed This Visit       Cardiovascular and Mediastinum   Primary hypertension - Primary    Patient BP  Vitals:   04/05/22 0946 04/05/22 0948  BP: (!) 142/79 138/78    in the office 04/05/22  Advised pt to follow a low sodium and heart healthy diet. Advise patient to check BP at home and bring the reading to the next appointment. Continue losartan  100 mg and chlorthalidone 50 mg daily. If needed would adjust the medication.       Relevant Medications   rosuvastatin (CRESTOR) 20 MG tablet     Endocrine   Diabetes 1.5, managed as type 2 ([place])    Her last hemoglobin A1c 6.1 on 01/03/2022. Advised patient to consume variety of food including fruits, vegetables, whole grains, complex carbohydrates and proteins.  Continue Farxiga 10 mg daily.        Relevant Medications   FARXIGA 10 MG TABS tablet   rosuvastatin (CRESTOR) 20 MG tablet   Other Relevant Orders   Microalbumin / creatinine urine ratio (Completed)   Hypothyroidism    Stable on medication. Continue levothyroxine 75 mcg half an hour before breakfast.        Other   Anxiety    Stable on medication. Continue Xanax 0.5 mg twice a day as needed.      Relevant Medications   ALPRAZolam (XANAX) 0.5 MG tablet   Other Visit Diagnoses     Flu vaccine need       Relevant Orders   Flu vaccine > 3yo with preservative IM (  Fluvirin Influenza Split) (Completed)        Meds ordered this encounter  Medications   ALPRAZolam (XANAX) 0.5 MG tablet    Sig: Take 1 tablet (0.5 mg total) by mouth 2 (two) times daily as needed. for anxiety    Dispense:  60 tablet    Refill:  0   FARXIGA 10 MG TABS tablet    Sig: Take 1 tablet (10 mg total) by mouth daily.    Dispense:  90 tablet    Refill:  2   rosuvastatin (CRESTOR) 20 MG tablet    Sig: Take 1 tablet (20 mg total) by mouth daily.    Dispense:  90 tablet    Refill:  2     Follow-up: No follow-ups on file.    Theresia Lo, NP

## 2022-04-05 NOTE — Assessment & Plan Note (Signed)
Patient BP  Vitals:   04/05/22 0946 04/05/22 0948  BP: (!) 142/79 138/78    in the office 04/05/22  Advised pt to follow a low sodium and heart healthy diet. Advise patient to check BP at home and bring the reading to the next appointment. If needed would adhust the medication.

## 2022-04-06 LAB — MICROALBUMIN / CREATININE URINE RATIO
Creatinine, Urine: 55 mg/dL (ref 20–275)
Microalb Creat Ratio: 9 mcg/mg creat (ref <30)
Microalb, Ur: 0.5 mg/dL

## 2022-04-10 ENCOUNTER — Encounter: Payer: Self-pay | Admitting: Nurse Practitioner

## 2022-04-10 ENCOUNTER — Institutional Professional Consult (permissible substitution): Payer: Medicare Other | Admitting: Student in an Organized Health Care Education/Training Program

## 2022-04-10 NOTE — Assessment & Plan Note (Signed)
Stable on medication. Continue levothyroxine 75 mcg half an hour before breakfast.

## 2022-04-10 NOTE — Assessment & Plan Note (Signed)
Her last hemoglobin A1c 6.1 on 01/03/2022. Advised patient to consume variety of food including fruits, vegetables, whole grains, complex carbohydrates and proteins.  Continue Farxiga 10 mg daily.

## 2022-04-10 NOTE — Assessment & Plan Note (Signed)
Stable on medication. Continue Xanax 0.5 mg twice a day as needed.

## 2022-04-11 ENCOUNTER — Ambulatory Visit: Payer: Self-pay | Admitting: Nurse Practitioner

## 2022-04-23 ENCOUNTER — Encounter: Admission: RE | Payer: Self-pay | Source: Home / Self Care

## 2022-04-23 ENCOUNTER — Ambulatory Visit: Admission: RE | Admit: 2022-04-23 | Payer: Medicare Other | Source: Home / Self Care | Admitting: Gastroenterology

## 2022-04-23 SURGERY — COLONOSCOPY WITH PROPOFOL
Anesthesia: General

## 2022-04-24 ENCOUNTER — Other Ambulatory Visit
Admission: RE | Admit: 2022-04-24 | Discharge: 2022-04-24 | Disposition: A | Discharge status: Home / Self Care | Payer: Medicare Other | Source: Ambulatory Visit | Attending: Ophthalmology | Admitting: Ophthalmology

## 2022-04-24 DIAGNOSIS — H2000 Unspecified acute and subacute iridocyclitis: Secondary | ICD-10-CM | POA: Diagnosis present

## 2022-04-24 LAB — CBC WITH DIFFERENTIAL/PLATELET
Abs Immature Granulocytes: 0.02 10*3/uL (ref 0.00–0.07)
Basophils Absolute: 0.1 10*3/uL (ref 0.0–0.1)
Basophils Relative: 1 %
Eosinophils Absolute: 0.5 10*3/uL (ref 0.0–0.5)
Eosinophils Relative: 5 %
HCT: 40.7 % (ref 36.0–46.0)
Hemoglobin: 13.8 g/dL (ref 12.0–15.0)
Immature Granulocytes: 0 %
Lymphocytes Relative: 33 %
Lymphs Abs: 2.8 10*3/uL (ref 0.7–4.0)
MCH: 31.3 pg (ref 26.0–34.0)
MCHC: 33.9 g/dL (ref 30.0–36.0)
MCV: 92.3 fL (ref 80.0–100.0)
Monocytes Absolute: 0.8 10*3/uL (ref 0.1–1.0)
Monocytes Relative: 9 %
Neutro Abs: 4.4 10*3/uL (ref 1.7–7.7)
Neutrophils Relative %: 52 %
Platelets: 232 10*3/uL (ref 150–400)
RBC: 4.41 MIL/uL (ref 3.87–5.11)
RDW: 13 % (ref 11.5–15.5)
WBC: 8.6 10*3/uL (ref 4.0–10.5)
nRBC: 0 % (ref 0.0–0.2)

## 2022-04-24 LAB — SEDIMENTATION RATE: Sed Rate: 41 mm/hr — ABNORMAL HIGH (ref 0–30)

## 2022-04-25 LAB — ANA: Anti Nuclear Antibody (ANA): NEGATIVE

## 2022-04-25 LAB — ACETYLCHOLINE RECEPTOR, BINDING: Acety choline binding ab: <0.03 nmol/L (ref 0.00–0.24)

## 2022-04-25 LAB — RPR: RPR Ser Ql: NONREACTIVE

## 2022-04-27 LAB — QUANTIFERON-TB GOLD PLUS (RQFGPL)
QuantiFERON Mitogen Value: >10 IU/mL
QuantiFERON Nil Value: 0.06 IU/mL
QuantiFERON TB1 Ag Value: 0.04 IU/mL
QuantiFERON TB2 Ag Value: 0.05 IU/mL

## 2022-04-27 LAB — QUANTIFERON-TB GOLD PLUS: QuantiFERON-TB Gold Plus: NEGATIVE

## 2022-05-01 ENCOUNTER — Institutional Professional Consult (permissible substitution): Payer: Medicare Other | Admitting: Student in an Organized Health Care Education/Training Program

## 2022-05-03 ENCOUNTER — Institutional Professional Consult (permissible substitution): Payer: Medicare Other | Admitting: Student in an Organized Health Care Education/Training Program

## 2022-05-04 ENCOUNTER — Other Ambulatory Visit: Payer: Self-pay | Admitting: Nurse Practitioner

## 2022-05-09 ENCOUNTER — Other Ambulatory Visit: Payer: Self-pay | Admitting: Nurse Practitioner

## 2022-06-05 ENCOUNTER — Other Ambulatory Visit: Payer: Self-pay | Admitting: Nurse Practitioner

## 2022-06-05 ENCOUNTER — Other Ambulatory Visit: Payer: Self-pay | Admitting: Internal Medicine

## 2022-06-05 DIAGNOSIS — J206 Acute bronchitis due to rhinovirus: Secondary | ICD-10-CM | POA: Diagnosis not present

## 2022-06-05 DIAGNOSIS — J9601 Acute respiratory failure with hypoxia: Secondary | ICD-10-CM | POA: Diagnosis not present

## 2022-06-07 ENCOUNTER — Ambulatory Visit: Payer: Medicare Other | Admitting: Nurse Practitioner

## 2022-06-10 ENCOUNTER — Other Ambulatory Visit: Payer: Self-pay | Admitting: Nurse Practitioner

## 2022-06-11 ENCOUNTER — Other Ambulatory Visit: Payer: Self-pay | Admitting: Nurse Practitioner

## 2022-06-12 ENCOUNTER — Telehealth: Payer: Self-pay | Admitting: Nurse Practitioner

## 2022-06-12 DIAGNOSIS — M5416 Radiculopathy, lumbar region: Secondary | ICD-10-CM | POA: Diagnosis not present

## 2022-06-12 DIAGNOSIS — M129 Arthropathy, unspecified: Secondary | ICD-10-CM | POA: Diagnosis not present

## 2022-06-12 DIAGNOSIS — E1169 Type 2 diabetes mellitus with other specified complication: Secondary | ICD-10-CM | POA: Diagnosis not present

## 2022-06-12 DIAGNOSIS — R69 Illness, unspecified: Secondary | ICD-10-CM | POA: Diagnosis not present

## 2022-06-12 DIAGNOSIS — Z79899 Other long term (current) drug therapy: Secondary | ICD-10-CM | POA: Diagnosis not present

## 2022-06-12 NOTE — Telephone Encounter (Signed)
Patient has canceled last 2 appointments for TOC and ha sno  control substance policy signed and has had one no show. Askng refill on alprazolam?.

## 2022-06-12 NOTE — Telephone Encounter (Signed)
MyChart message:  Helen Benson  P [facility] Admin [facility] (supporting Mychart, Generic)13 hours ago (5:46 PM)   Please refill my prescription for [place]am,and I will have to cancel my appointment on the 18 due to transportation thanking   Mychart, [place] hours ago (2:21 PM)  GM Appointment Information:     Visit Type: TRANSFER OF CARE         Date: 06/13/2022                 Dept: [facility]                 Provider: Theresia Lo                 Time: 2:00 PM                 Length: 40 min   Appt Status: Scheduled

## 2022-06-13 ENCOUNTER — Encounter: Payer: Medicare HMO | Admitting: Nurse Practitioner

## 2022-06-13 NOTE — Telephone Encounter (Signed)
We will refill Xanax this time but she need to make follow up appointment in order to get the medication refilled in the future.

## 2022-06-13 NOTE — Progress Notes (Deleted)
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  Past Medical History    Past Medical History:  Diagnosis Date   Erythrocytosis    Hyperlipidemia    Hypertension    Kidney damage    Type 2 diabetes mellitus (Helen Benson)    Past Surgical History:  Procedure Laterality Date   BREAST CYST ASPIRATION Bilateral    Bilat. many on both per pt   BREAST CYST EXCISION Left    age 47   JOINT REPLACEMENT      Allergies  Allergies  Allergen Reactions   Gabapentin Other (See Comments)    Had psychotic episodes      History of Present Illness    Breana Benson. Maclaren is a 67 year old female with a previously mentioned past medical history.  She presented to the [facility] emergency department on 02/26/2022 with complaints of shortness of breath and was found to be hypoxic with oxygen saturations 62%.  During her hospital admission she had coronary calcification noted on chest CT as well and with elevated high-sensitivity troponins.  It was deemed that time that she would continue with ischemic workup in the outpatient setting after her acute illness had resolved.  Echocardiogram was completed and revealed an LVEF of 70-75%, no regional wall motion abnormalities, G2 DD, there is mitral valve severe annular calcification without evidence of regurgitation.  She underwent a [employer] which revealed findings are consistent with no prior ischemia no prior myocardial infarction the study was considered low risk.  She returns to clinic today   Home Medications    Current Outpatient Medications  Medication Sig Dispense Refill    ALPRAZolam (XANAX) 0.5 MG tablet TAKE 1 TABLET BY MOUTH TWICE DAILY AS NEEDED FOR ANXIETY 60 tablet 0   chlorthalidone (HYGROTON) 50 MG tablet Take 1 tablet (50 mg total) by mouth daily. 30 tablet 0   Cholecalciferol (VITAMIN D-3 PO) Take 1 capsule by mouth daily at 2 PM.     FARXIGA 10 MG TABS tablet Take 1 tablet (10 mg total) by mouth daily. 90 tablet 2   levothyroxine (SYNTHROID) 75 MCG tablet Take 1 tablet (75 mcg total) by mouth daily before breakfast. 90 tablet 1   Loratadine (ALLERGY RELIEF 24-HR PO) Take 1 tablet by mouth daily at 2 PM.     losartan (COZAAR) 100 MG tablet TAKE 1 TABLET BY MOUTH ONCE DAILY 30 tablet 0   Misc. Devices (FINGERTIP PULSE OXIMETER) MISC 1 each by Does not apply route as directed. 1 each 0   naloxone (NARCAN) nasal spray 4 mg/0.1 mL      Oxycodone HCl 10 MG TABS Take 10 mg by mouth 3 (three) times daily.     rosuvastatin (CRESTOR) 20 MG tablet Take 1 tablet (20 mg total) by mouth daily. 90 tablet 2   valACYclovir (VALTREX) 500 MG tablet Take 1 tablet (500 mg total) by mouth 3 (three) times daily. 21 tablet 0   No current facility-administered medications for this visit.     Family History    Family History  Problem Relation Age of Onset   Breast cancer Sister 29   Breast cancer Other 69  neice   She indicated that the status of her sister is unknown. She indicated that the status of her other is unknown.  Social History    Social History   Socioeconomic History   Marital status: Single    Spouse name: Not on file   Number of children: Not on file   Years of education: Not on file   Highest education level: Not on file  Occupational History   Not on file  Tobacco Use   Smoking status: Former    Packs/day: 1.50    Years: 35.00    Total pack years: 52.50    Types: Cigarettes    Quit date: 02/26/2022    Years since quitting: 0.2   Smokeless tobacco: Never  Substance and Sexual Activity   Alcohol use: No   Drug use: Never    Sexual activity: Not Currently  Other Topics Concern   Not on file  Social History Narrative   Not on file   Social Determinants of Health   Financial Resource Strain: Not on file  Food Insecurity: Not on file  Transportation Needs: Not on file  Physical Activity: Not on file  Stress: Not on file  Social Connections: Not on file  Intimate Partner Violence: Not on file     Review of Systems    General:  No chills, fever, night sweats or weight changes.  Cardiovascular:  No chest pain, dyspnea on exertion, edema, orthopnea, palpitations, paroxysmal nocturnal dyspnea. Dermatological: No rash, lesions/masses Respiratory: No cough, dyspnea Urologic: No hematuria, dysuria Abdominal:   No nausea, vomiting, diarrhea, bright red blood per rectum, melena, or hematemesis Neurologic:  No visual changes, wkns, changes in mental status. All other systems reviewed and are otherwise negative except as noted above.     Physical Exam    VS:  There were no vitals taken for this visit. , BMI There is no height or weight on file to calculate BMI.     GEN: Well nourished, well developed, in no acute distress. HEENT: normal. Neck: Supple, no JVD, carotid bruits, or masses. Cardiac: RRR, no murmurs, rubs, or gallops. No clubbing, cyanosis, edema.  Radials 2+/PT 2+ and equal bilaterally.  Respiratory:  Respirations regular and unlabored, clear to auscultation bilaterally. GI: Soft, nontender, nondistended, BS + x 4. MS: no deformity or atrophy. Skin: warm and dry, no rash. Neuro:  Strength and sensation are intact. Psych: Normal affect.  Accessory Clinical Findings    ECG personally reviewed by me today- *** - No acute changes  Lab Results  Component Value Date   WBC 8.6 04/24/2022   HGB 13.8 04/24/2022   HCT 40.7 04/24/2022   MCV 92.3 04/24/2022   PLT 232 04/24/2022   Lab Results  Component Value Date   CREATININE 0.99 03/05/2022   BUN 57 (H) 03/05/2022   NA 140 03/05/2022   K  4.0 03/05/2022   CL 102 03/05/2022   CO2 31 03/05/2022   Lab Results  Component Value Date   ALT 19 02/26/2022   AST 30 02/26/2022   ALKPHOS 57 02/26/2022   BILITOT 1.3 (H) 02/26/2022   Lab Results  Component Value Date   CHOL 82 02/27/2022   HDL 29 (L) 02/27/2022   LDLCALC 39 02/27/2022   TRIG 69 02/27/2022   CHOLHDL 2.8 02/27/2022    Lab Results  Component Value Date   HGBA1C 6.1 (H) 01/03/2022    Assessment & Plan   1.  ***  Garrette Caine, NP 06/13/2022, 7:17  PM

## 2022-06-14 ENCOUNTER — Ambulatory Visit: Payer: Medicare HMO | Attending: Cardiology | Admitting: Cardiology

## 2022-06-14 NOTE — Telephone Encounter (Signed)
Called patient she stated that pain management has taken over this script so I have called and cancel the one from NP.

## 2022-06-17 ENCOUNTER — Encounter: Payer: Self-pay | Admitting: Cardiology

## 2022-06-17 DIAGNOSIS — Z79899 Other long term (current) drug therapy: Secondary | ICD-10-CM | POA: Diagnosis not present

## 2022-07-04 ENCOUNTER — Other Ambulatory Visit: Payer: Self-pay | Admitting: Nurse Practitioner

## 2022-07-12 DIAGNOSIS — E1169 Type 2 diabetes mellitus with other specified complication: Secondary | ICD-10-CM | POA: Diagnosis not present

## 2022-07-12 DIAGNOSIS — R69 Illness, unspecified: Secondary | ICD-10-CM | POA: Diagnosis not present

## 2022-07-12 DIAGNOSIS — M5416 Radiculopathy, lumbar region: Secondary | ICD-10-CM | POA: Diagnosis not present

## 2022-07-12 DIAGNOSIS — M129 Arthropathy, unspecified: Secondary | ICD-10-CM | POA: Diagnosis not present

## 2022-07-12 DIAGNOSIS — Z79899 Other long term (current) drug therapy: Secondary | ICD-10-CM | POA: Diagnosis not present

## 2022-07-12 DIAGNOSIS — Z6829 Body mass index (BMI) 29.0-29.9, adult: Secondary | ICD-10-CM | POA: Diagnosis not present

## 2022-07-15 ENCOUNTER — Ambulatory Visit: Payer: Medicare HMO | Attending: Cardiology | Admitting: Medical

## 2022-07-15 ENCOUNTER — Encounter: Payer: Self-pay | Admitting: Medical

## 2022-07-15 NOTE — Progress Notes (Deleted)
Cardiology Office Note:    Date:  07/15/2022   ID:  Farris Has, DOB 10/22/1955, MRN [id number]  PCP:  Theresia Lo, NP  [facility] Cardiologist:  None  [facility] Electrophysiologist:  None   Referring MD: Theresia Lo, NP   Chief Complaint: 4 month follow-up  History of Present Illness:    Helen Benson is a 67 y.o. 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   The patient was admitted to [facility] in October with SOB and hypoxia treated for acute bronchitis, and sent home with oxygen therapy. Echo showed LVEF 70-75%, no WMA, G2DD, mitral valve severe annular calcification without evidence of regurgitation. HS troponin was elevated to 114 and she was treated with IV heparin for 48 hours. She was scheduled for [facility]. Atenolol was held for hypotension and bradycardia.   MPI showed no ischemia or scar, normal LVEF overall low risk, coronary artery calcification, aortic atherosclerosis and mitral annular calcification were noted.   She was last seen 03/21/22 and reported persistent shortness of breath.   Past Medical History:  Diagnosis Date   Erythrocytosis    Hyperlipidemia    Hypertension    Kidney damage    Type 2 diabetes mellitus ([place])     Past Surgical History:  Procedure Laterality Date   BREAST CYST ASPIRATION Bilateral    Bilat. many on both per pt   BREAST CYST EXCISION Left    age 22   JOINT REPLACEMENT      Current Medications: No outpatient medications have been marked as taking for the 07/15/22 encounter (Appointment) with Kathlen Mody, Domonick Sittner H, PA-C.     Allergies:   Gabapentin   Social History   Socioeconomic History   Marital status: Single    Spouse name: Not on file   Number of children: Not on file   Years of education: Not on file   Highest education level: Not on file  Occupational History   Not on file  Tobacco Use   Smoking status:  Former    Packs/day: 1.50    Years: 35.00    Total pack years: 52.50    Types: Cigarettes    Quit date: 02/26/2022    Years since quitting: 0.3   Smokeless tobacco: Never  Substance and Sexual Activity   Alcohol use: No   Drug use: Never   Sexual activity: Not Currently  Other Topics Concern   Not on file  Social History Narrative   Not on file   Social Determinants of Health   Financial Resource Strain: Not on file  Food Insecurity: Not on file  Transportation Needs: Not on file  Physical Activity: Not on file  Stress: Not on file  Social Connections: Not on file     Family History: The patient's ***family history includes Breast cancer (age of onset: 51) in her sister; Breast cancer (age of onset: 59) in an other family member.  ROS:   Please see the history of present illness.    *** All other systems reviewed and are negative.  EKGs/Labs/Other Studies Reviewed:    The following studies were reviewed today: ***  EKG:  EKG is *** ordered today.  The ekg ordered today demonstrates ***  Recent Labs: 02/26/2022: ALT 19; B Natriuretic Peptide 255.2; TSH 1.115 03/04/2022: Magnesium 2.0 03/05/2022: BUN 57; Creatinine, Ser 0.99; Potassium 4.0; Sodium 140 04/24/2022: Hemoglobin 13.8; Platelets 232  Recent Lipid Panel  Component Value Date/Time   CHOL 82 02/27/2022 0432   TRIG 69 02/27/2022 0432   HDL 29 (L) 02/27/2022 0432   CHOLHDL 2.8 02/27/2022 0432   VLDL 14 02/27/2022 0432   LDLCALC 39 02/27/2022 0432   LDLCALC 110 (H) 01/03/2022 1044     Risk Assessment/Calculations:   {Does this patient have ATRIAL FIBRILLATION?:[phone]}   Physical Exam:    VS:  There were no vitals taken for this visit.    Wt Readings from Last 3 Encounters:  04/05/22 168 lb 8 oz (76.4 kg)  03/21/22 167 lb 12.8 oz (76.1 kg)  03/14/22 166 lb 14.4 oz (75.7 kg)     GEN: *** Well nourished, well developed in no acute distress HEENT: Normal NECK: No JVD; No carotid  bruits LYMPHATICS: No lymphadenopathy CARDIAC: ***RRR, no murmurs, rubs, gallops RESPIRATORY:  Clear to auscultation without rales, wheezing or rhonchi  ABDOMEN: Soft, non-tender, non-distended MUSCULOSKELETAL:  No edema; No deformity  SKIN: Warm and dry NEUROLOGIC:  Alert and oriented x 3 PSYCHIATRIC:  Normal affect   ASSESSMENT:    No diagnosis found. PLAN:    In order of problems listed above:  ***  Disposition: Follow up {follow up:15908} with ***   Shared Decision Making/Informed Consent   {Are you ordering a CV Procedure (e.g. stress test, cath, DCCV, TEE, etc)?   Press F2        :[id number]    Signed, Emmett Arntz Ninfa Meeker, PA-C  07/15/2022 8:12 AM    [facility]

## 2022-07-18 DIAGNOSIS — Z79899 Other long term (current) drug therapy: Secondary | ICD-10-CM | POA: Diagnosis not present

## 2022-07-26 DIAGNOSIS — J9601 Acute respiratory failure with hypoxia: Secondary | ICD-10-CM | POA: Diagnosis not present

## 2022-07-26 DIAGNOSIS — J206 Acute bronchitis due to rhinovirus: Secondary | ICD-10-CM | POA: Diagnosis not present

## 2022-08-08 DIAGNOSIS — Z6829 Body mass index (BMI) 29.0-29.9, adult: Secondary | ICD-10-CM | POA: Diagnosis not present

## 2022-08-08 DIAGNOSIS — M5416 Radiculopathy, lumbar region: Secondary | ICD-10-CM | POA: Diagnosis not present

## 2022-08-08 DIAGNOSIS — E1169 Type 2 diabetes mellitus with other specified complication: Secondary | ICD-10-CM | POA: Diagnosis not present

## 2022-08-08 DIAGNOSIS — R69 Illness, unspecified: Secondary | ICD-10-CM | POA: Diagnosis not present

## 2022-08-08 DIAGNOSIS — Z79899 Other long term (current) drug therapy: Secondary | ICD-10-CM | POA: Diagnosis not present

## 2022-08-08 DIAGNOSIS — M129 Arthropathy, unspecified: Secondary | ICD-10-CM | POA: Diagnosis not present

## 2022-08-12 DIAGNOSIS — Z79899 Other long term (current) drug therapy: Secondary | ICD-10-CM | POA: Diagnosis not present

## 2022-09-04 DIAGNOSIS — J206 Acute bronchitis due to rhinovirus: Secondary | ICD-10-CM | POA: Diagnosis not present

## 2022-09-04 DIAGNOSIS — J9601 Acute respiratory failure with hypoxia: Secondary | ICD-10-CM | POA: Diagnosis not present

## 2022-09-09 DIAGNOSIS — Z79899 Other long term (current) drug therapy: Secondary | ICD-10-CM | POA: Diagnosis not present

## 2022-09-09 DIAGNOSIS — Z0000 Encounter for general adult medical examination without abnormal findings: Secondary | ICD-10-CM | POA: Diagnosis not present

## 2022-09-09 DIAGNOSIS — E1169 Type 2 diabetes mellitus with other specified complication: Secondary | ICD-10-CM | POA: Diagnosis not present

## 2022-09-09 DIAGNOSIS — R69 Illness, unspecified: Secondary | ICD-10-CM | POA: Diagnosis not present

## 2022-09-09 DIAGNOSIS — M5416 Radiculopathy, lumbar region: Secondary | ICD-10-CM | POA: Diagnosis not present

## 2022-09-09 DIAGNOSIS — M129 Arthropathy, unspecified: Secondary | ICD-10-CM | POA: Diagnosis not present

## 2022-09-21 DIAGNOSIS — E079 Disorder of thyroid, unspecified: Secondary | ICD-10-CM | POA: Diagnosis not present

## 2022-09-21 DIAGNOSIS — R5383 Other fatigue: Secondary | ICD-10-CM | POA: Diagnosis not present

## 2022-10-04 DIAGNOSIS — J206 Acute bronchitis due to rhinovirus: Secondary | ICD-10-CM | POA: Diagnosis not present

## 2022-10-04 DIAGNOSIS — J9601 Acute respiratory failure with hypoxia: Secondary | ICD-10-CM | POA: Diagnosis not present

## 2022-10-08 DIAGNOSIS — M5416 Radiculopathy, lumbar region: Secondary | ICD-10-CM | POA: Diagnosis not present

## 2022-10-08 DIAGNOSIS — F411 Generalized anxiety disorder: Secondary | ICD-10-CM | POA: Diagnosis not present

## 2022-10-08 DIAGNOSIS — Z79899 Other long term (current) drug therapy: Secondary | ICD-10-CM | POA: Diagnosis not present

## 2022-10-08 DIAGNOSIS — E1169 Type 2 diabetes mellitus with other specified complication: Secondary | ICD-10-CM | POA: Diagnosis not present

## 2022-10-10 DIAGNOSIS — Z79899 Other long term (current) drug therapy: Secondary | ICD-10-CM | POA: Diagnosis not present

## 2022-10-18 DIAGNOSIS — E119 Type 2 diabetes mellitus without complications: Secondary | ICD-10-CM | POA: Diagnosis not present

## 2022-10-18 DIAGNOSIS — H2000 Unspecified acute and subacute iridocyclitis: Secondary | ICD-10-CM | POA: Diagnosis not present

## 2022-10-18 DIAGNOSIS — H2513 Age-related nuclear cataract, bilateral: Secondary | ICD-10-CM | POA: Diagnosis not present

## 2022-10-18 LAB — HM DIABETES EYE EXAM

## 2022-11-05 NOTE — Progress Notes (Deleted)
I,Cordarious Zeek S Khadijatou Borak,acting as a Neurosurgeon for Jacky Kindle, FNP.,have documented all relevant documentation on the behalf of Jacky Kindle, FNP,as directed by  Jacky Kindle, FNP while in the presence of Jacky Kindle, FNP.  New patient visit   Patient: Helen Benson   DOB: 04-06-1956   67 y.o. Female  MRN: [id number] Visit Date: 11/06/2022  Today's healthcare provider: Jacky Kindle, FNP   No chief complaint on file.  Subjective    Helen Benson is a 67 y.o. female who presents today as a new patient to establish care.  HPI  Last PCP Kara Dies, NP at [facility].  Past Medical History:  Diagnosis Date   Erythrocytosis    Hyperlipidemia    Hypertension    Kidney damage    Type 2 diabetes mellitus (HCC)    Past Surgical History:  Procedure Laterality Date   BREAST CYST ASPIRATION Bilateral    Bilat. many on both per pt   BREAST CYST EXCISION Left    age 80   JOINT REPLACEMENT     Family Status  Relation Name Status   Sister  (Not Specified)   Other  (Not Specified)   Family History  Problem Relation Age of Onset   Breast cancer Sister 29   Breast cancer Other 35       neice   Social History   Socioeconomic History   Marital status: Single    Spouse name: Not on file   Number of children: Not on file   Years of education: Not on file   Highest education level: Not on file  Occupational History   Not on file  Tobacco Use   Smoking status: Former    Packs/day: 1.50    Years: 35.00    Additional pack years: 0.00    Total pack years: 52.50    Types: Cigarettes    Quit date: 02/26/2022    Years since quitting: 0.6   Smokeless tobacco: Never  Substance and Sexual Activity   Alcohol use: No   Drug use: Never   Sexual activity: Not Currently  Other Topics Concern   Not on file  Social History Narrative   Not on file   Social Determinants of Health   Financial Resource Strain: Not on file  Food Insecurity: Not on file   Transportation Needs: Not on file  Physical Activity: Not on file  Stress: Not on file  Social Connections: Not on file   Outpatient Medications Prior to Visit  Medication Sig   ALPRAZolam (XANAX) 0.5 MG tablet TAKE 1 TABLET BY MOUTH TWICE DAILY AS NEEDED FOR ANXIETY   chlorthalidone (HYGROTON) 50 MG tablet Take 1 tablet (50 mg total) by mouth daily.   Cholecalciferol (VITAMIN D-3 PO) Take 1 capsule by mouth daily at 2 PM.   FARXIGA 10 MG TABS tablet Take 1 tablet (10 mg total) by mouth daily.   levothyroxine (SYNTHROID) 75 MCG tablet Take 1 tablet (75 mcg total) by mouth daily before breakfast.   Loratadine (ALLERGY RELIEF 24-HR PO) Take 1 tablet by mouth daily at 2 PM.   losartan (COZAAR) 100 MG tablet TAKE 1 TABLET BY MOUTH ONCE DAILY   Misc. Devices (FINGERTIP PULSE OXIMETER) MISC 1 each by Does not apply route as directed.   naloxone (NARCAN) nasal spray 4 mg/0.1 mL    Oxycodone HCl 10 MG TABS Take 10 mg by mouth 3 (three) times daily.   rosuvastatin (CRESTOR)  20 MG tablet Take 1 tablet (20 mg total) by mouth daily.   valACYclovir (VALTREX) 500 MG tablet Take 1 tablet (500 mg total) by mouth 3 (three) times daily.   No facility-administered medications prior to visit.   Allergies  Allergen Reactions   Gabapentin Other (See Comments)    Had psychotic episodes      Immunization History  Administered Date(s) Administered   Influenza Split 04/05/2022   Influenza, Seasonal, Injecte, Preservative Fre 03/08/2005, 04/03/2007, 02/27/2010, 02/11/2012   Tdap 11/26/2010    Health Maintenance  Topic Date Due   Colonoscopy  Never done   DTaP/Tdap/Td (2 - Td or Tdap) 11/25/2020   MAMMOGRAM  04/28/2021   OPHTHALMOLOGY EXAM  05/27/2022   HEMOGLOBIN A1C  07/06/2022   Pneumonia Vaccine 16+ Years old (1 of 2 - PCV) 01/04/2023 (Originally 08/19/1961)   Zoster Vaccines- Shingrix (1 of 2) 03/27/2023 (Originally 08/20/1974)   INFLUENZA VACCINE  12/26/2022   Lung Cancer Screening  02/27/2023    Diabetic kidney evaluation - eGFR measurement  03/06/2023   Diabetic kidney evaluation - Urine ACR  04/06/2023   FOOT EXAM  04/06/2023   Medicare Annual Wellness (AWV)  09/09/2023   DEXA SCAN  Completed   Hepatitis C Screening  Completed   HPV VACCINES  Aged Out   COVID-19 Vaccine  Discontinued    Patient Care Team: Kara Dies, NP as PCP - General (Nurse Practitioner)  Review of Systems  All other systems reviewed and are negative.   {Labs  Heme  Chem  Endocrine  Serology  Results Review (optional):23779}   Objective    There were no vitals taken for this visit. {Show previous vital signs (optional):23777}  Physical Exam ***  Depression Screen    01/03/2022    9:26 AM  PHQ 2/9 Scores  PHQ - 2 Score 2  PHQ- 9 Score 3   No results found for any visits on 11/06/22.  Assessment & Plan     ***  No follow-ups on file.     {provider attestation***:1}   Jacky Kindle, FNP  [facility] [phone] (phone) [phone] (fax)  [facility]

## 2022-11-06 ENCOUNTER — Ambulatory Visit: Payer: Medicare HMO | Admitting: Family Medicine

## 2022-12-04 DIAGNOSIS — J9601 Acute respiratory failure with hypoxia: Secondary | ICD-10-CM | POA: Diagnosis not present

## 2022-12-04 DIAGNOSIS — J206 Acute bronchitis due to rhinovirus: Secondary | ICD-10-CM | POA: Diagnosis not present

## 2022-12-05 DIAGNOSIS — M5416 Radiculopathy, lumbar region: Secondary | ICD-10-CM | POA: Diagnosis not present

## 2022-12-05 DIAGNOSIS — Z79899 Other long term (current) drug therapy: Secondary | ICD-10-CM | POA: Diagnosis not present

## 2022-12-05 DIAGNOSIS — E1169 Type 2 diabetes mellitus with other specified complication: Secondary | ICD-10-CM | POA: Diagnosis not present

## 2022-12-10 DIAGNOSIS — Z79899 Other long term (current) drug therapy: Secondary | ICD-10-CM | POA: Diagnosis not present

## 2022-12-16 ENCOUNTER — Other Ambulatory Visit: Payer: Self-pay | Admitting: Nurse Practitioner

## 2022-12-16 DIAGNOSIS — I10 Essential (primary) hypertension: Secondary | ICD-10-CM | POA: Diagnosis not present

## 2022-12-16 DIAGNOSIS — E119 Type 2 diabetes mellitus without complications: Secondary | ICD-10-CM | POA: Diagnosis not present

## 2022-12-16 DIAGNOSIS — N182 Chronic kidney disease, stage 2 (mild): Secondary | ICD-10-CM | POA: Diagnosis not present

## 2022-12-25 ENCOUNTER — Ambulatory Visit: Payer: 59 | Admitting: Family Medicine

## 2023-01-03 DIAGNOSIS — M5416 Radiculopathy, lumbar region: Secondary | ICD-10-CM | POA: Diagnosis not present

## 2023-01-03 DIAGNOSIS — E1169 Type 2 diabetes mellitus with other specified complication: Secondary | ICD-10-CM | POA: Diagnosis not present

## 2023-01-03 DIAGNOSIS — E039 Hypothyroidism, unspecified: Secondary | ICD-10-CM | POA: Diagnosis not present

## 2023-01-03 DIAGNOSIS — Z79899 Other long term (current) drug therapy: Secondary | ICD-10-CM | POA: Diagnosis not present

## 2023-01-04 DIAGNOSIS — J206 Acute bronchitis due to rhinovirus: Secondary | ICD-10-CM | POA: Diagnosis not present

## 2023-01-04 DIAGNOSIS — J9601 Acute respiratory failure with hypoxia: Secondary | ICD-10-CM | POA: Diagnosis not present

## 2023-01-07 DIAGNOSIS — Z79899 Other long term (current) drug therapy: Secondary | ICD-10-CM | POA: Diagnosis not present

## 2023-01-22 ENCOUNTER — Ambulatory Visit: Payer: Self-pay | Admitting: Family Medicine

## 2023-02-03 DIAGNOSIS — E039 Hypothyroidism, unspecified: Secondary | ICD-10-CM | POA: Diagnosis not present

## 2023-02-03 DIAGNOSIS — E1169 Type 2 diabetes mellitus with other specified complication: Secondary | ICD-10-CM | POA: Diagnosis not present

## 2023-02-03 DIAGNOSIS — Z79899 Other long term (current) drug therapy: Secondary | ICD-10-CM | POA: Diagnosis not present

## 2023-02-03 DIAGNOSIS — M5416 Radiculopathy, lumbar region: Secondary | ICD-10-CM | POA: Diagnosis not present

## 2023-02-04 DIAGNOSIS — J9601 Acute respiratory failure with hypoxia: Secondary | ICD-10-CM | POA: Diagnosis not present

## 2023-02-04 DIAGNOSIS — J206 Acute bronchitis due to rhinovirus: Secondary | ICD-10-CM | POA: Diagnosis not present

## 2023-02-06 DIAGNOSIS — Z79899 Other long term (current) drug therapy: Secondary | ICD-10-CM | POA: Diagnosis not present

## 2023-02-25 ENCOUNTER — Ambulatory Visit: Payer: 59 | Admitting: Family Medicine

## 2023-02-28 ENCOUNTER — Encounter: Payer: Self-pay | Admitting: Nurse Practitioner

## 2023-02-28 ENCOUNTER — Ambulatory Visit: Payer: 59 | Admitting: Nurse Practitioner

## 2023-02-28 VITALS — BP 134/82 | HR 62 | Temp 98.1°F | Ht 63.5 in | Wt 190.0 lb

## 2023-02-28 DIAGNOSIS — E039 Hypothyroidism, unspecified: Secondary | ICD-10-CM

## 2023-02-28 DIAGNOSIS — I10 Essential (primary) hypertension: Secondary | ICD-10-CM | POA: Diagnosis not present

## 2023-02-28 DIAGNOSIS — N1831 Chronic kidney disease, stage 3a: Secondary | ICD-10-CM

## 2023-02-28 DIAGNOSIS — E1369 Other specified diabetes mellitus with other specified complication: Secondary | ICD-10-CM | POA: Diagnosis not present

## 2023-02-28 DIAGNOSIS — F17200 Nicotine dependence, unspecified, uncomplicated: Secondary | ICD-10-CM

## 2023-02-28 DIAGNOSIS — E785 Hyperlipidemia, unspecified: Secondary | ICD-10-CM | POA: Diagnosis not present

## 2023-02-28 DIAGNOSIS — Z1321 Encounter for screening for nutritional disorder: Secondary | ICD-10-CM | POA: Diagnosis not present

## 2023-02-28 DIAGNOSIS — E139 Other specified diabetes mellitus without complications: Secondary | ICD-10-CM | POA: Diagnosis not present

## 2023-02-28 DIAGNOSIS — Z23 Encounter for immunization: Secondary | ICD-10-CM

## 2023-02-28 MED ORDER — ROSUVASTATIN CALCIUM 20 MG PO TABS: 20.0000 mg | ORAL_TABLET | Freq: Every day | ORAL | 2 refills | Status: DC

## 2023-02-28 MED ORDER — FARXIGA 10 MG PO TABS: 10.0000 mg | ORAL_TABLET | Freq: Every day | ORAL | 1 refills | Status: DC

## 2023-02-28 MED ORDER — LOSARTAN POTASSIUM 100 MG PO TABS: 100.0000 mg | ORAL_TABLET | Freq: Every day | ORAL | 1 refills | Status: DC

## 2023-02-28 NOTE — Progress Notes (Unsigned)
Established Patient Office Visit  Subjective:  Patient ID: Helen Benson, female    DOB: 10-12-1955  Age: 67 y.o. MRN: [id number]  CC:  Chief Complaint  Patient presents with   Medical Management of Chronic Issues    HPI  Helen Benson presents for:  HPI   Past Medical History:  Diagnosis Date   Erythrocytosis    Hyperlipidemia    Hypertension    Kidney damage    Type 2 diabetes mellitus (HCC)     Past Surgical History:  Procedure Laterality Date   BREAST CYST ASPIRATION Bilateral    Bilat. many on both per pt   BREAST CYST EXCISION Left    age 28   JOINT REPLACEMENT      Family History  Problem Relation Age of Onset   Breast cancer Sister 34   Breast cancer Other 56       neice    Social History   Socioeconomic History   Marital status: Single    Spouse name: Not on file   Number of children: Not on file   Years of education: Not on file   Highest education level: Not on file  Occupational History   Not on file  Tobacco Use   Smoking status: Former    Current packs/day: 0.00    Average packs/day: 1.5 packs/day for 35.0 years (52.5 ttl pk-yrs)    Types: Cigarettes    Start date: 02/27/1987    Quit date: 02/26/2022    Years since quitting: 1.0   Smokeless tobacco: Never  Substance and Sexual Activity   Alcohol use: No   Drug use: Never   Sexual activity: Not Currently  Other Topics Concern   Not on file  Social History Narrative   Not on file   Social Determinants of Health   Financial Resource Strain: Low Risk  (02/28/2023)   Overall Financial Resource Strain (CARDIA)    Difficulty of Paying Living Expenses: Not very hard  Food Insecurity: No Food Insecurity (02/28/2023)   Hunger Vital Sign    Worried About Running Out of Food in the Last Year: Never true    Ran Out of Food in the Last Year: Never true  Transportation Needs: Unmet Transportation Needs (02/28/2023)   PRAPARE - Administrator, Civil Service (Medical): Yes     Lack of Transportation (Non-Medical): No  Physical Activity: Insufficiently Active (02/28/2023)   Exercise Vital Sign    Days of Exercise per Week: 7 days    Minutes of Exercise per Session: 20 min  Stress: Patient Declined (02/28/2023)   [employer] of Occupational Health - Occupational Stress Questionnaire    Feeling of Stress : Patient declined  Social Connections: Socially Isolated (02/28/2023)   Social Connection and Isolation Panel [NHANES]    Frequency of Communication with Friends and Family: More than three times a week    Frequency of Social Gatherings with Friends and Family: Twice a week    Attends Religious Services: Never    Database administrator or Organizations: No    Attends Engineer, structural: Not on file    Marital Status: Divorced  Catering manager Violence: Not on file     Outpatient Medications Prior to Visit  Medication Sig Dispense Refill   ALPRAZolam (XANAX) 0.5 MG tablet TAKE 1 TABLET BY MOUTH TWICE DAILY AS NEEDED FOR ANXIETY 60 tablet 0   chlorthalidone (HYGROTON) 50 MG tablet Take 1 tablet (50 mg total) by mouth  daily. 30 tablet 0   Cholecalciferol (VITAMIN D-3 PO) Take 1 capsule by mouth daily at 2 PM.     FARXIGA 10 MG TABS tablet Take 1 tablet (10 mg total) by mouth daily. 90 tablet 2   levothyroxine (SYNTHROID) 75 MCG tablet Take 1 tablet (75 mcg total) by mouth daily before breakfast. 90 tablet 1   Loratadine (ALLERGY RELIEF 24-HR PO) Take 1 tablet by mouth daily at 2 PM.     losartan (COZAAR) 100 MG tablet TAKE 1 TABLET BY MOUTH ONCE DAILY 30 tablet 0   Misc. Devices (FINGERTIP PULSE OXIMETER) MISC 1 each by Does not apply route as directed. 1 each 0   naloxone (NARCAN) nasal spray 4 mg/0.1 mL      oxyCODONE (ROXICODONE) 15 MG immediate release tablet Take 15 mg by mouth.     rosuvastatin (CRESTOR) 20 MG tablet TAKE 1 TABLET BY MOUTH ONCE EVERY EVENING 90 tablet 2   valACYclovir (VALTREX) 500 MG tablet Take 1 tablet (500 mg total) by  mouth 3 (three) times daily. 21 tablet 0   amLODipine (NORVASC) 5 MG tablet Take 1 tablet by mouth daily.     atenolol (TENORMIN) 25 MG tablet Take 25 mg by mouth daily.     Oxycodone HCl 10 MG TABS Take 10 mg by mouth 3 (three) times daily.     No facility-administered medications prior to visit.    Allergies  Allergen Reactions   Gabapentin Other (See Comments)    Had psychotic episodes      ROS Review of Systems Negative unless indicated in HPI.    Objective:    Physical Exam  There were no vitals taken for this visit. Wt Readings from Last 3 Encounters:  04/05/22 168 lb 8 oz (76.4 kg)  03/21/22 167 lb 12.8 oz (76.1 kg)  03/14/22 166 lb 14.4 oz (75.7 kg)     Health Maintenance  Topic Date Due   Pneumonia Vaccine 53+ Years old (1 of 2 - PCV) Never done   Colonoscopy  Never done   DTaP/Tdap/Td (2 - Td or Tdap) 11/25/2020   MAMMOGRAM  04/28/2021   OPHTHALMOLOGY EXAM  05/27/2022   HEMOGLOBIN A1C  07/06/2022   INFLUENZA VACCINE  12/26/2022   Lung Cancer Screening  02/27/2023   Diabetic kidney evaluation - eGFR measurement  03/06/2023   Zoster Vaccines- Shingrix (1 of 2) 03/27/2023 (Originally 08/20/1974)   Diabetic kidney evaluation - Urine ACR  04/06/2023   FOOT EXAM  04/06/2023   Medicare Annual Wellness (AWV)  09/09/2023   DEXA SCAN  Completed   Hepatitis C Screening  Completed   HPV VACCINES  Aged Out   COVID-19 Vaccine  Discontinued    There are no preventive care reminders to display for this patient.  Lab Results  Component Value Date   TSH 1.115 02/26/2022   Lab Results  Component Value Date   WBC 8.6 04/24/2022   HGB 13.8 04/24/2022   HCT 40.7 04/24/2022   MCV 92.3 04/24/2022   PLT 232 04/24/2022   Lab Results  Component Value Date   NA 140 03/05/2022   K 4.0 03/05/2022   CO2 31 03/05/2022   GLUCOSE 127 (H) 03/05/2022   BUN 57 (H) 03/05/2022   CREATININE 0.99 03/05/2022   BILITOT 1.3 (H) 02/26/2022   ALKPHOS 57 02/26/2022   AST 30  02/26/2022   ALT 19 02/26/2022   PROT 7.8 02/26/2022   ALBUMIN 4.2 02/26/2022   CALCIUM 9.3 03/05/2022  ANIONGAP 7 03/05/2022   EGFR 59 (L) 01/03/2022   Lab Results  Component Value Date   CHOL 82 02/27/2022   Lab Results  Component Value Date   HDL 29 (L) 02/27/2022   Lab Results  Component Value Date   LDLCALC 39 02/27/2022   Lab Results  Component Value Date   TRIG 69 02/27/2022   Lab Results  Component Value Date   CHOLHDL 2.8 02/27/2022   Lab Results  Component Value Date   HGBA1C 6.1 (H) 01/03/2022      Assessment & Plan:  Primary hypertension  Diabetes 1.5, managed as type 2 (HCC)  Stage 3a chronic kidney disease (HCC)  Hypothyroidism, unspecified type  Dyslipidemia  Need for influenza vaccination -     Flu Vaccine Trivalent High Dose (Fluad)    Follow-up: No follow-ups on file.   Kara Dies, NP

## 2023-02-28 NOTE — Patient Instructions (Signed)
Please go to the lab for blood work

## 2023-03-01 LAB — CBC WITH DIFFERENTIAL/PLATELET
Absolute Monocytes: 902 {cells}/uL (ref 200–950)
Basophils Absolute: 77 {cells}/uL (ref 0–200)
Basophils Relative: 0.7 %
Eosinophils Absolute: 165 {cells}/uL (ref 15–500)
Eosinophils Relative: 1.5 %
HCT: 47.8 % — ABNORMAL HIGH (ref 35.0–45.0)
Hemoglobin: 15.8 g/dL — ABNORMAL HIGH (ref 11.7–15.5)
Lymphs Abs: 3047 {cells}/uL (ref 850–3900)
MCH: 31.3 pg (ref 27.0–33.0)
MCHC: 33.1 g/dL (ref 32.0–36.0)
MCV: 94.8 fL (ref 80.0–100.0)
MPV: 10.8 fL (ref 7.5–12.5)
Monocytes Relative: 8.2 %
Neutro Abs: 6809 {cells}/uL (ref 1500–7800)
Neutrophils Relative %: 61.9 %
Platelets: 335 10*3/uL (ref 140–400)
RBC: 5.04 10*6/uL (ref 3.80–5.10)
RDW: 12.9 % (ref 11.0–15.0)
Total Lymphocyte: 27.7 %
WBC: 11 10*3/uL — ABNORMAL HIGH (ref 3.8–10.8)

## 2023-03-01 LAB — COMPREHENSIVE METABOLIC PANEL
AG Ratio: 1.6 (calc) (ref 1.0–2.5)
ALT: 14 U/L (ref 6–29)
AST: 12 U/L (ref 10–35)
Albumin: 4.5 g/dL (ref 3.6–5.1)
Alkaline phosphatase (APISO): 85 U/L (ref 37–153)
BUN/Creatinine Ratio: 19 (calc) (ref 6–22)
BUN: 26 mg/dL — ABNORMAL HIGH (ref 7–25)
CO2: 27 mmol/L (ref 20–32)
Calcium: 10.6 mg/dL — ABNORMAL HIGH (ref 8.6–10.4)
Chloride: 99 mmol/L (ref 98–110)
Creat: 1.34 mg/dL — ABNORMAL HIGH (ref 0.50–1.05)
Globulin: 2.8 g/dL (ref 1.9–3.7)
Glucose, Bld: 113 mg/dL — ABNORMAL HIGH (ref 65–99)
Potassium: 3.8 mmol/L (ref 3.5–5.3)
Sodium: 139 mmol/L (ref 135–146)
Total Bilirubin: 0.5 mg/dL (ref 0.2–1.2)
Total Protein: 7.3 g/dL (ref 6.1–8.1)

## 2023-03-01 LAB — TSH: TSH: 3.06 m[IU]/L (ref 0.40–4.50)

## 2023-03-01 LAB — HEMOGLOBIN A1C
Hgb A1c MFr Bld: 7.3 %{Hb} — ABNORMAL HIGH (ref <5.7)
Mean Plasma Glucose: 163 mg/dL
eAG (mmol/L): 9 mmol/L

## 2023-03-01 LAB — LIPID PANEL
Cholesterol: 182 mg/dL (ref <200)
HDL: 37 mg/dL — ABNORMAL LOW (ref >=50)
LDL Cholesterol (Calc): 113 mg/dL — ABNORMAL HIGH
Non-HDL Cholesterol (Calc): 145 mg/dL — ABNORMAL HIGH (ref <130)
Total CHOL/HDL Ratio: 4.9 (calc) (ref <5.0)
Triglycerides: 201 mg/dL — ABNORMAL HIGH (ref <150)

## 2023-03-01 LAB — VITAMIN D 25 HYDROXY (VIT D DEFICIENCY, FRACTURES): Vit D, 25-Hydroxy: 38 ng/mL (ref 30–100)

## 2023-03-01 LAB — LDL CHOLESTEROL, DIRECT: Direct LDL: 134 mg/dL — ABNORMAL HIGH (ref <100)

## 2023-03-03 DIAGNOSIS — R030 Elevated blood-pressure reading, without diagnosis of hypertension: Secondary | ICD-10-CM | POA: Diagnosis not present

## 2023-03-03 DIAGNOSIS — M5416 Radiculopathy, lumbar region: Secondary | ICD-10-CM | POA: Diagnosis not present

## 2023-03-03 DIAGNOSIS — M549 Dorsalgia, unspecified: Secondary | ICD-10-CM | POA: Diagnosis not present

## 2023-03-03 DIAGNOSIS — Z79899 Other long term (current) drug therapy: Secondary | ICD-10-CM | POA: Diagnosis not present

## 2023-03-03 DIAGNOSIS — E1169 Type 2 diabetes mellitus with other specified complication: Secondary | ICD-10-CM | POA: Diagnosis not present

## 2023-03-03 DIAGNOSIS — E039 Hypothyroidism, unspecified: Secondary | ICD-10-CM | POA: Diagnosis not present

## 2023-03-03 DIAGNOSIS — M542 Cervicalgia: Secondary | ICD-10-CM | POA: Diagnosis not present

## 2023-03-03 DIAGNOSIS — G8929 Other chronic pain: Secondary | ICD-10-CM | POA: Diagnosis not present

## 2023-03-05 NOTE — Assessment & Plan Note (Addendum)
Patient states smokes some days not regularly. Patient deferred lung cancer screening at present. Counseling provided to quit smoking.

## 2023-03-05 NOTE — Assessment & Plan Note (Signed)
Advised patient to consume variety of food including fruits, vegetables, whole grains, complex carbohydrates and proteins.  Continue Farxiga 10 mg daily.

## 2023-03-05 NOTE — Assessment & Plan Note (Signed)
Continue statin therapy for cardiovascular risk reduction. 

## 2023-03-05 NOTE — Assessment & Plan Note (Signed)
Patient BP  Vitals:   02/28/23 1530 02/28/23 1531  BP: (!) 160/80 134/82    in the office  Advised pt to follow a low sodium and heart healthy diet. Advise patient to check BP at home and bring the reading to the next appointment. Continue losartan 100 mg, chlorthalidone 25 mg, amlodipine 5 mg and atenolol 25 mg daily. Will continue to monitor.

## 2023-03-05 NOTE — Assessment & Plan Note (Deleted)
Previous GFR 66 on 12/16/2022. Advised to avoid NSAIDs and other nephrotoxic agent.

## 2023-03-06 ENCOUNTER — Other Ambulatory Visit: Payer: Self-pay | Admitting: Nurse Practitioner

## 2023-03-06 DIAGNOSIS — J206 Acute bronchitis due to rhinovirus: Secondary | ICD-10-CM | POA: Diagnosis not present

## 2023-03-06 DIAGNOSIS — Z79899 Other long term (current) drug therapy: Secondary | ICD-10-CM | POA: Diagnosis not present

## 2023-03-06 DIAGNOSIS — J9601 Acute respiratory failure with hypoxia: Secondary | ICD-10-CM | POA: Diagnosis not present

## 2023-03-06 NOTE — Telephone Encounter (Signed)
Pt requesting previously filled by ed doctor

## 2023-03-06 NOTE — Telephone Encounter (Signed)
Patient just called and needs 2 refills. The first is levothyroxine (SYNTHROID) 75 MCG tablet and the other is chlorthalidone (HYGROTON) 50 MG tablet. The pharmacy she use is [facility] [place], [place] - [address] [place] [address] Phone: [phone]  Fax: [phone]  Her number is [phone]

## 2023-03-09 NOTE — Telephone Encounter (Signed)
She is on chlorthalidone 25 mg.

## 2023-03-14 ENCOUNTER — Ambulatory Visit: Payer: 59

## 2023-03-21 NOTE — Progress Notes (Signed)
WBC, Hb and HCT slightly elevated. Kidney function elevated: Increase fluid intake and avoid NSAIDs. Liver function and cholesterol elevated: Manage diet and exercise. Cholesterol elevated: Manage diet and exercise. Hgb A1c increased from 6.1 to 7.3   TSH and Vit D normal.

## 2023-04-04 DIAGNOSIS — M5416 Radiculopathy, lumbar region: Secondary | ICD-10-CM | POA: Diagnosis not present

## 2023-04-04 DIAGNOSIS — Z79899 Other long term (current) drug therapy: Secondary | ICD-10-CM | POA: Diagnosis not present

## 2023-04-04 DIAGNOSIS — E1169 Type 2 diabetes mellitus with other specified complication: Secondary | ICD-10-CM | POA: Diagnosis not present

## 2023-04-06 DIAGNOSIS — J206 Acute bronchitis due to rhinovirus: Secondary | ICD-10-CM | POA: Diagnosis not present

## 2023-04-06 DIAGNOSIS — J9601 Acute respiratory failure with hypoxia: Secondary | ICD-10-CM | POA: Diagnosis not present

## 2023-04-10 DIAGNOSIS — Z79899 Other long term (current) drug therapy: Secondary | ICD-10-CM | POA: Diagnosis not present

## 2023-04-14 ENCOUNTER — Ambulatory Visit: Payer: Medicare HMO | Admitting: Nurse Practitioner

## 2023-04-14 NOTE — Progress Notes (Unsigned)
  New patient visit Patient was not seen for appt d/t no call, no show, or late arrival >10 mins past appt time.    Debera Lat PA [facility] [address] [place], [place] [address] [phone] (phone) [phone] (fax) [facility]

## 2023-04-15 ENCOUNTER — Ambulatory Visit: Payer: 59 | Admitting: Physician Assistant

## 2023-04-15 DIAGNOSIS — Z91199 Patient's noncompliance with other medical treatment and regimen due to unspecified reason: Secondary | ICD-10-CM

## 2023-05-05 DIAGNOSIS — E1169 Type 2 diabetes mellitus with other specified complication: Secondary | ICD-10-CM | POA: Diagnosis not present

## 2023-05-05 DIAGNOSIS — Z79899 Other long term (current) drug therapy: Secondary | ICD-10-CM | POA: Diagnosis not present

## 2023-05-05 DIAGNOSIS — R030 Elevated blood-pressure reading, without diagnosis of hypertension: Secondary | ICD-10-CM | POA: Diagnosis not present

## 2023-05-05 DIAGNOSIS — M5416 Radiculopathy, lumbar region: Secondary | ICD-10-CM | POA: Diagnosis not present

## 2023-05-06 DIAGNOSIS — J206 Acute bronchitis due to rhinovirus: Secondary | ICD-10-CM | POA: Diagnosis not present

## 2023-05-06 DIAGNOSIS — J9601 Acute respiratory failure with hypoxia: Secondary | ICD-10-CM | POA: Diagnosis not present

## 2023-05-09 DIAGNOSIS — Z79899 Other long term (current) drug therapy: Secondary | ICD-10-CM | POA: Diagnosis not present

## 2023-05-19 ENCOUNTER — Telehealth: Payer: Self-pay

## 2023-05-19 NOTE — Telephone Encounter (Signed)
Copied from CRM [id number]. Topic: Clinical - Medication Question >> May 19, 2023  1:30 PM Denese Killings wrote: Reason for CRM: Patient wants to know if she can switch medications for her Diabetes she states that she is gaining a tremendous amount of weight.

## 2023-05-19 NOTE — Telephone Encounter (Signed)
I will forward to the patient's PCP to address this.

## 2023-05-21 NOTE — Telephone Encounter (Signed)
Please call pt to schedule an appointment to discuss medication management for diabetes.

## 2023-05-22 NOTE — Telephone Encounter (Signed)
Patient is scheduled for 05/23/23 at 11:00.

## 2023-05-23 ENCOUNTER — Ambulatory Visit (INDEPENDENT_AMBULATORY_CARE_PROVIDER_SITE_OTHER): Payer: 59 | Admitting: Nurse Practitioner

## 2023-05-23 ENCOUNTER — Encounter: Payer: Self-pay | Admitting: Nurse Practitioner

## 2023-05-23 VITALS — BP 162/82 | HR 84 | Temp 98.0°F | Resp 18 | Ht 63.5 in | Wt 194.0 lb

## 2023-05-23 DIAGNOSIS — Z1211 Encounter for screening for malignant neoplasm of colon: Secondary | ICD-10-CM

## 2023-05-23 DIAGNOSIS — E785 Hyperlipidemia, unspecified: Secondary | ICD-10-CM

## 2023-05-23 DIAGNOSIS — Z122 Encounter for screening for malignant neoplasm of respiratory organs: Secondary | ICD-10-CM

## 2023-05-23 DIAGNOSIS — E039 Hypothyroidism, unspecified: Secondary | ICD-10-CM | POA: Diagnosis not present

## 2023-05-23 DIAGNOSIS — E119 Type 2 diabetes mellitus without complications: Secondary | ICD-10-CM

## 2023-05-23 DIAGNOSIS — I10 Essential (primary) hypertension: Secondary | ICD-10-CM | POA: Diagnosis not present

## 2023-05-23 DIAGNOSIS — Z1231 Encounter for screening mammogram for malignant neoplasm of breast: Secondary | ICD-10-CM

## 2023-05-23 DIAGNOSIS — Z7984 Long term (current) use of oral hypoglycemic drugs: Secondary | ICD-10-CM | POA: Diagnosis not present

## 2023-05-23 DIAGNOSIS — M5416 Radiculopathy, lumbar region: Secondary | ICD-10-CM

## 2023-05-23 DIAGNOSIS — M25521 Pain in right elbow: Secondary | ICD-10-CM

## 2023-05-23 LAB — MICROALBUMIN / CREATININE URINE RATIO
Creatinine,U: 62.6 mg/dL
Microalb Creat Ratio: 1.2 mg/g (ref 0.0–30.0)
Microalb, Ur: 0.7 mg/dL (ref 0.0–1.9)

## 2023-05-23 LAB — TSH: TSH: 1.71 u[IU]/mL (ref 0.35–5.50)

## 2023-05-23 MED ORDER — SEMAGLUTIDE(0.25 OR 0.5MG/DOS) 2 MG/3ML ~~LOC~~ SOPN: 0.5000 mg | PEN_INJECTOR | SUBCUTANEOUS | 2 refills | Status: DC

## 2023-05-23 MED ORDER — SEMAGLUTIDE(0.25 OR 0.5MG/DOS) 2 MG/3ML ~~LOC~~ SOPN: 0.2500 mg | PEN_INJECTOR | SUBCUTANEOUS | Status: DC

## 2023-05-23 NOTE — Patient Instructions (Signed)
Please go to the lab for blood work.  YOUR MAMMOGRAM IS DUE, PLEASE CALL AND GET THIS SCHEDULED! [facility] - call [phone]

## 2023-05-23 NOTE — Progress Notes (Unsigned)
Established Patient Office Visit  Subjective:  Patient ID: Helen Benson, female    DOB: [birth year]  Age: 67 y.o. MRN: [id number]  CC:  Chief Complaint  Patient presents with   Medication Consultation    Wants to talk about wegovy or Ozempic    HPI  Helen Benson presents for:  HPI   Past Medical History:  Diagnosis Date   Erythrocytosis    Hyperlipidemia    Hypertension    Kidney damage    Type 2 diabetes mellitus (HCC)     Past Surgical History:  Procedure Laterality Date   BREAST CYST ASPIRATION Bilateral    Bilat. many on both per pt   BREAST CYST EXCISION Left    age 2   JOINT REPLACEMENT      Family History  Problem Relation Age of Onset   Breast cancer Sister 9   Breast cancer Other 49       neice    Social History   Socioeconomic History   Marital status: Single    Spouse name: Not on file   Number of children: Not on file   Years of education: Not on file   Highest education level: Not on file  Occupational History   Not on file  Tobacco Use   Smoking status: Some Days    Current packs/day: 0.00    Average packs/day: 1.5 packs/day for 35.0 years (52.5 ttl pk-yrs)    Types: Cigarettes    Start date: 02/27/1987    Last attempt to quit: 02/26/2022    Years since quitting: 1.2   Smokeless tobacco: Never  Substance and Sexual Activity   Alcohol use: No   Drug use: Never   Sexual activity: Yes  Other Topics Concern   Not on file  Social History Narrative   Not on file   Social Drivers of Health   Financial Resource Strain: Low Risk  (02/28/2023)   Overall Financial Resource Strain (CARDIA)    Difficulty of Paying Living Expenses: Not very hard  Food Insecurity: No Food Insecurity (02/28/2023)   Hunger Vital Sign    Worried About Running Out of Food in the Last Year: Never true    Ran Out of Food in the Last Year: Never true  Transportation Needs: Unmet Transportation Needs (02/28/2023)   PRAPARE - Scientist, research (physical sciences) (Medical): Yes    Lack of Transportation (Non-Medical): No  Physical Activity: Insufficiently Active (02/28/2023)   Exercise Vital Sign    Days of Exercise per Week: 7 days    Minutes of Exercise per Session: 20 min  Stress: Patient Declined (02/28/2023)   [employer] of Occupational Health - Occupational Stress Questionnaire    Feeling of Stress : Patient declined  Social Connections: Socially Isolated (02/28/2023)   Social Connection and Isolation Panel [NHANES]    Frequency of Communication with Friends and Family: More than three times a week    Frequency of Social Gatherings with Friends and Family: Twice a week    Attends Religious Services: Never    Database administrator or Organizations: No    Attends Engineer, structural: Not on file    Marital Status: Divorced  Catering manager Violence: Not on file     Outpatient Medications Prior to Visit  Medication Sig Dispense Refill   ALPRAZolam (XANAX) 0.5 MG tablet TAKE 1 TABLET BY MOUTH TWICE DAILY AS NEEDED FOR ANXIETY 60 tablet 0   amLODipine (NORVASC) 5 MG  tablet Take 1 tablet by mouth daily.     atenolol (TENORMIN) 25 MG tablet Take 25 mg by mouth daily.     Cholecalciferol (VITAMIN D-3 PO) Take 1 capsule by mouth daily at 2 PM.     FARXIGA 10 MG TABS tablet Take 1 tablet (10 mg total) by mouth daily. 90 tablet 1   levothyroxine (SYNTHROID) 75 MCG tablet TAKE 1 TABLET BY MOUTH ONCE DAILY ON AN EMPTY STOMACH. WAIT 30 MINUTES BEFORE TAKING OTHER MEDS. 90 tablet 1   Loratadine (ALLERGY RELIEF 24-HR PO) Take 1 tablet by mouth daily at 2 PM.     losartan (COZAAR) 100 MG tablet Take 1 tablet (100 mg total) by mouth daily. 90 tablet 1   Misc. Devices (FINGERTIP PULSE OXIMETER) MISC 1 each by Does not apply route as directed. 1 each 0   naloxone (NARCAN) nasal spray 4 mg/0.1 mL      oxyCODONE (ROXICODONE) 15 MG immediate release tablet Take 15 mg by mouth.     rosuvastatin (CRESTOR) 20 MG tablet Take 1  tablet (20 mg total) by mouth daily. 90 tablet 2   valACYclovir (VALTREX) 500 MG tablet Take 1 tablet (500 mg total) by mouth 3 (three) times daily. 21 tablet 0   chlorthalidone (HYGROTON) 50 MG tablet Take 1 tablet (50 mg total) by mouth daily. 30 tablet 0   No facility-administered medications prior to visit.    Allergies  Allergen Reactions   Gabapentin Other (See Comments)    Had psychotic episodes      ROS Review of Systems Negative unless indicated in HPI.    Objective:    Physical Exam  BP (!) 162/82   Pulse 84   Temp 98 F (36.7 C)   Resp 18   Ht 5' 3.5" (1.613 m)   Wt 194 lb (88 kg)   SpO2 99%   BMI 33.83 kg/m  Wt Readings from Last 3 Encounters:  05/23/23 194 lb (88 kg)  02/28/23 190 lb (86.2 kg)  04/05/22 168 lb 8 oz (76.4 kg)     Health Maintenance  Topic Date Due   Medicare Annual Wellness (AWV)  Never done   Colonoscopy  Never done   DTaP/Tdap/Td (2 - Td or Tdap) 11/25/2020   MAMMOGRAM  04/28/2021   OPHTHALMOLOGY EXAM  05/27/2022   Lung Cancer Screening  02/27/2023   Diabetic kidney evaluation - Urine ACR  04/06/2023   FOOT EXAM  04/06/2023   Zoster Vaccines- Shingrix (1 of 2) 08/21/2023 (Originally 08/20/1974)   INFLUENZA VACCINE  08/25/2023 (Originally 12/26/2022)   Pneumonia Vaccine 73+ Years old (1 of 2 - PCV) 05/22/2024 (Originally 08/19/1961)   HEMOGLOBIN A1C  08/29/2023   Diabetic kidney evaluation - eGFR measurement  02/28/2024   DEXA SCAN  Completed   Hepatitis C Screening  Completed   HPV VACCINES  Aged Out   COVID-19 Vaccine  Discontinued    There are no preventive care reminders to display for this patient.  Lab Results  Component Value Date   TSH 3.06 02/28/2023   Lab Results  Component Value Date   WBC 11.0 (H) 02/28/2023   HGB 15.8 (H) 02/28/2023   HCT 47.8 (H) 02/28/2023   MCV 94.8 02/28/2023   PLT 335 02/28/2023   Lab Results  Component Value Date   NA 139 02/28/2023   K 3.8 02/28/2023   CO2 27 02/28/2023    GLUCOSE 113 (H) 02/28/2023   BUN 26 (H) 02/28/2023   CREATININE 1.34 (H)  02/28/2023   BILITOT 0.5 02/28/2023   ALKPHOS 57 02/26/2022   AST 12 02/28/2023   ALT 14 02/28/2023   PROT 7.3 02/28/2023   ALBUMIN 4.2 02/26/2022   CALCIUM 10.6 (H) 02/28/2023   ANIONGAP 7 03/05/2022   EGFR 59 (L) 01/03/2022   Lab Results  Component Value Date   CHOL 182 02/28/2023   Lab Results  Component Value Date   HDL 37 (L) 02/28/2023   Lab Results  Component Value Date   LDLCALC 113 (H) 02/28/2023   Lab Results  Component Value Date   TRIG 201 (H) 02/28/2023   Lab Results  Component Value Date   CHOLHDL 4.9 02/28/2023   Lab Results  Component Value Date   HGBA1C 7.3 (H) 02/28/2023      Assessment & Plan:  There are no diagnoses linked to this encounter.  Follow-up: No follow-ups on file.   Kara Dies, NP

## 2023-05-25 ENCOUNTER — Encounter: Payer: Self-pay | Admitting: Nurse Practitioner

## 2023-05-25 DIAGNOSIS — M5416 Radiculopathy, lumbar region: Secondary | ICD-10-CM | POA: Insufficient documentation

## 2023-05-25 DIAGNOSIS — M25521 Pain in right elbow: Secondary | ICD-10-CM | POA: Insufficient documentation

## 2023-05-25 MED ORDER — ROSUVASTATIN CALCIUM 20 MG PO TABS: 10.0000 mg | ORAL_TABLET | Freq: Every day | ORAL | Status: DC

## 2023-05-25 NOTE — Assessment & Plan Note (Signed)
Blood pressure is elevated today. Patient reports discontinuing amlodipine due to leg swelling. -Reduce amlodipine dose to 2.5mg  and monitor for swelling. -Continue atenolol, chlorthalidone and losartan. -Will continue to monitor

## 2023-05-25 NOTE — Assessment & Plan Note (Signed)
Patient currently established with [facility] from pain management and is interested in transferring care closer to home. -Referral sent to [facility] pain management.

## 2023-05-25 NOTE — Assessment & Plan Note (Signed)
Patient reports significant weight gain over the past year and is currently on Farxiga for diabetes management. Patient is interested in starting Ozempic.  Patient denies personal or family history of MEN 2 or MTC. Sample provided for Ozempic 0.25 mg weekly.  Medication side effect discussed. Lab Results  Component Value Date   HGBA1C 7.3 (H) 02/28/2023

## 2023-05-25 NOTE — Assessment & Plan Note (Signed)
Severe pain and limited range of motion for over a month, exacerbated by cold weather. No trauma reported.  -Refer for physical therapy. -Consider steroid injections if no improvement with physical therapy.

## 2023-05-25 NOTE — Assessment & Plan Note (Signed)
Patient is not taking prescribed Crestor 20mg  due to concerns about side effects. -Reduce Crestor dose to 10mg  and encourage adherence. Lab Results  Component Value Date   CHOL 182 02/28/2023   HDL 37 (L) 02/28/2023   LDLCALC 113 (H) 02/28/2023   LDLDIRECT 134 (H) 02/28/2023   TRIG 201 (H) 02/28/2023   CHOLHDL 4.9 02/28/2023

## 2023-05-25 NOTE — Assessment & Plan Note (Signed)
Patient reports increased fatigue and changes in temperature tolerance. Currently on thyroid replacement therapy. -Check thyroid hormone levels.

## 2023-06-03 DIAGNOSIS — Z0000 Encounter for general adult medical examination without abnormal findings: Secondary | ICD-10-CM | POA: Diagnosis not present

## 2023-06-03 DIAGNOSIS — F17210 Nicotine dependence, cigarettes, uncomplicated: Secondary | ICD-10-CM | POA: Diagnosis not present

## 2023-06-03 DIAGNOSIS — E1169 Type 2 diabetes mellitus with other specified complication: Secondary | ICD-10-CM | POA: Diagnosis not present

## 2023-06-03 DIAGNOSIS — Z79899 Other long term (current) drug therapy: Secondary | ICD-10-CM | POA: Diagnosis not present

## 2023-06-03 DIAGNOSIS — M5416 Radiculopathy, lumbar region: Secondary | ICD-10-CM | POA: Diagnosis not present

## 2023-06-04 ENCOUNTER — Other Ambulatory Visit: Payer: Self-pay

## 2023-06-04 MED ORDER — ATENOLOL 25 MG PO TABS: 25.0000 mg | ORAL_TABLET | Freq: Every day | ORAL | 3 refills | Status: DC

## 2023-06-06 DIAGNOSIS — Z79899 Other long term (current) drug therapy: Secondary | ICD-10-CM | POA: Diagnosis not present

## 2023-06-06 DIAGNOSIS — J206 Acute bronchitis due to rhinovirus: Secondary | ICD-10-CM | POA: Diagnosis not present

## 2023-06-06 DIAGNOSIS — J9601 Acute respiratory failure with hypoxia: Secondary | ICD-10-CM | POA: Diagnosis not present

## 2023-06-10 ENCOUNTER — Encounter: Payer: Self-pay | Admitting: *Deleted

## 2023-06-18 ENCOUNTER — Ambulatory Visit (INDEPENDENT_AMBULATORY_CARE_PROVIDER_SITE_OTHER): Payer: 59 | Admitting: *Deleted

## 2023-06-18 VITALS — Ht 63.5 in | Wt 186.0 lb

## 2023-06-18 DIAGNOSIS — Z0000 Encounter for general adult medical examination without abnormal findings: Secondary | ICD-10-CM | POA: Diagnosis not present

## 2023-06-18 NOTE — Patient Instructions (Addendum)
[name] , Thank you for taking time to come for your Medicare Wellness Visit. I appreciate your ongoing commitment to your health goals. Please review the following plan we discussed and let me know if I can assist you in the future.   Referrals/Orders/Follow-Ups/Clinician Recommendations: Consider updating your vaccines. Call and schedule your mammogram.  This is a list of the screening recommended for you and due dates:  Health Maintenance  Topic Date Due   Colon Cancer Screening  Never done   DTaP/Tdap/Td vaccine (2 - Td or Tdap) 11/25/2020   Mammogram  04/28/2021   Screening for Lung Cancer  02/27/2023   Complete foot exam   04/06/2023   Zoster (Shingles) Vaccine (1 of 2) 08/21/2023*   Flu Shot  08/25/2023*   Pneumonia Vaccine (1 of 2 - PCV) 05/22/2024*   Hemoglobin A1C  08/29/2023   Eye exam for diabetics  10/18/2023   Yearly kidney function blood test for diabetes  02/28/2024   Yearly kidney health urinalysis for diabetes  05/22/2024   Medicare Annual Wellness Visit  06/17/2024   DEXA scan (bone density measurement)  Completed   Hepatitis C Screening  Completed   HPV Vaccine  Aged Out   COVID-19 Vaccine  Discontinued  *Topic was postponed. The date shown is not the original due date.    Advanced directives: (Declined) Advance directive discussed with you today. Even though you declined this today, please call our office should you change your mind, and we can give you the proper paperwork for you to fill out.  Next Medicare Annual Wellness Visit scheduled for next year: Yes 06/23/24 @ 11:30  Managing Pain Without Opioids Opioids are strong medicines used to treat moderate to severe pain. For some people, especially those who have long-term (chronic) pain, opioids may not be the best choice for pain management due to: Side effects like nausea, constipation, and sleepiness. The risk of addiction (opioid use disorder). The longer you take opioids, the greater your risk of  addiction. Pain that lasts for more than 3 months is called chronic pain. Managing chronic pain usually requires more than one approach and is often provided by a team of health care providers working together (multidisciplinary approach). Pain management may be done at a pain management center or pain clinic. How to manage pain without the use of opioids Use non-opioid medicines Non-opioid medicines for pain may include: Over-the-counter or prescription non-steroidal anti-inflammatory drugs (NSAIDs). These may be the first medicines used for pain. They work well for muscle and bone pain, and they reduce swelling. Acetaminophen. This over-the-counter medicine may work well for milder pain but not swelling. Antidepressants. These may be used to treat chronic pain. A certain type of antidepressant (tricyclics) is often used. These medicines are given in lower doses for pain than when used for depression. Anticonvulsants. These are usually used to treat seizures but may also reduce nerve (neuropathic) pain. Muscle relaxants. These relieve pain caused by sudden muscle tightening (spasms). You may also use a pain medicine that is applied to the skin as a patch, cream, or gel (topical analgesic), such as a numbing medicine. These may cause fewer side effects than medicines taken by mouth. Do certain therapies as directed Some therapies can help with pain management. They include: Physical therapy. You will do exercises to gain strength and flexibility. A physical therapist may teach you exercises to move and stretch parts of your body that are weak, stiff, or painful. You can learn these exercises at physical  therapy visits and practice them at home. Physical therapy may also involve: Massage. Heat wraps or applying heat or cold to affected areas. Electrical signals that interrupt pain signals (transcutaneous electrical nerve stimulation, TENS). Weak lasers that reduce pain and swelling (low-level laser  therapy). Signals from your body that help you learn to regulate pain (biofeedback). Occupational therapy. This helps you to learn ways to function at home and work with less pain. Recreational therapy. This involves trying new activities or hobbies, such as a physical activity or drawing. Mental health therapy, including: Cognitive behavioral therapy (CBT). This helps you learn coping skills for dealing with pain. Acceptance and commitment therapy (ACT) to change the way you think and react to pain. Relaxation therapies, including muscle relaxation exercises and mindfulness-based stress reduction. Pain management counseling. This may be individual, family, or group counseling.  Receive medical treatments Medical treatments for pain management include: Nerve block injections. These may include a pain blocker and anti-inflammatory medicines. You may have injections: Near the spine to relieve chronic back or neck pain. Into joints to relieve back or joint pain. Into nerve areas that supply a painful area to relieve body pain. Into muscles (trigger point injections) to relieve some painful muscle conditions. A medical device placed near your spine to help block pain signals and relieve nerve pain or chronic back pain (spinal cord stimulation device). Acupuncture. Follow these instructions at home Medicines Take over-the-counter and prescription medicines only as told by your health care provider. If you are taking pain medicine, ask your health care providers about possible side effects to watch out for. Do not drive or use heavy machinery while taking prescription opioid pain medicine. Lifestyle  Do not use drugs or alcohol to reduce pain. If you drink alcohol, limit how much you have to: 0-1 drink a day for women who are not pregnant. 0-2 drinks a day for men. Know how much alcohol is in a drink. In the [place] one drink equals one 12 oz bottle of beer (355 mL), one 5 oz glass of wine (148  mL), or one 1 oz glass of hard liquor (44 mL). Do not use any products that contain nicotine or tobacco. These products include cigarettes, chewing tobacco, and vaping devices, such as e-cigarettes. If you need help quitting, ask your health care provider. Eat a healthy diet and maintain a healthy weight. Poor diet and excess weight may make pain worse. Eat foods that are high in fiber. These include fresh fruits and vegetables, whole grains, and beans. Limit foods that are high in fat and processed sugars, such as fried and sweet foods. Exercise regularly. Exercise lowers stress and may help relieve pain. Ask your health care provider what activities and exercises are safe for you. If your health care provider approves, join an exercise class that combines movement and stress reduction. Examples include yoga and tai chi. Get enough sleep. Lack of sleep may make pain worse. Lower stress as much as possible. Practice stress reduction techniques as told by your therapist. General instructions Work with all your pain management providers to find the treatments that work best for you. You are an important member of your pain management team. There are many things you can do to reduce pain on your own. Consider joining an online or in-person support group for people who have chronic pain. Keep all follow-up visits. This is important. Where to find more information You can find more information about managing pain without opioids from: American Academy of Pain  Medicine: painmed.org Institute for Chronic Pain: instituteforchronicpain.org American Chronic Pain Association: theacpa.org Contact a health care provider if: You have side effects from pain medicine. Your pain gets worse or does not get better with treatments or home therapy. You are struggling with anxiety or depression. Summary Many types of pain can be managed without opioids. Chronic pain may respond better to pain management without  opioids. Pain is best managed when you and a team of health care providers work together. Pain management without opioids may include non-opioid medicines, medical treatments, physical therapy, mental health therapy, and lifestyle changes. Tell your health care providers if your pain gets worse or is not being managed well enough. This information is not intended to replace advice given to you by your health care provider. Make sure you discuss any questions you have with your health care provider. Document Revised: 08/23/2020 Document Reviewed: 08/23/2020 Elsevier Patient Education  2024 [employer].

## 2023-06-18 NOTE — Progress Notes (Signed)
Subjective:   Helen Benson is a 68 y.o. female who presents for Medicare Annual (Subsequent) preventive examination.  Visit Complete: Virtual I connected with  Helen Benson on 06/18/23 by a audio enabled telemedicine application and verified that I am speaking with the correct person using two identifiers.This patient declined Interactive audio and Acupuncturist. Therefore the visit was completed with audio only.   Patient Location: Home  Provider Location: Home Office  I discussed the limitations of evaluation and management by telemedicine. The patient expressed understanding and agreed to proceed.  Vital Signs: Because this visit was a virtual/telehealth visit, some criteria may be missing or patient reported. Any vitals not documented were not able to be obtained and vitals that have been documented are patient reported.   Cardiac Risk Factors include: advanced age (>25men, >50 women);diabetes mellitus;dyslipidemia;hypertension;obesity (BMI >30kg/m2);smoking/ tobacco exposure     Objective:    Today's Vitals   06/18/23 1433  Weight: 186 lb (84.4 kg)  Height: 5' 3.5" (1.613 m)   Body mass index is 32.43 kg/m.     06/18/2023    2:48 PM 02/13/2021    2:12 PM 01/08/2021    3:56 PM 10/23/2015   12:19 PM  Advanced Directives  Does Patient Have a Medical Advance Directive? No No No No  Would patient like information on creating a medical advance directive? No - Patient declined No - Patient declined No - Patient declined No - patient declined information    Current Medications (verified) Outpatient Encounter Medications as of 06/18/2023  Medication Sig   ALPRAZolam (XANAX) 0.5 MG tablet TAKE 1 TABLET BY MOUTH TWICE DAILY AS NEEDED FOR ANXIETY   amLODipine (NORVASC) 5 MG tablet Take 1 tablet by mouth daily.   atenolol (TENORMIN) 25 MG tablet Take 1 tablet (25 mg total) by mouth daily.   Cholecalciferol (VITAMIN D-3 PO) Take 1 capsule by mouth daily at 2 PM.    FARXIGA 10 MG TABS tablet Take 1 tablet (10 mg total) by mouth daily.   levothyroxine (SYNTHROID) 75 MCG tablet TAKE 1 TABLET BY MOUTH ONCE DAILY ON AN EMPTY STOMACH. WAIT 30 MINUTES BEFORE TAKING OTHER MEDS.   Loratadine (ALLERGY RELIEF 24-HR PO) Take 1 tablet by mouth daily at 2 PM.   losartan (COZAAR) 100 MG tablet Take 1 tablet (100 mg total) by mouth daily.   Misc. Devices (FINGERTIP PULSE OXIMETER) MISC 1 each by Does not apply route as directed.   naloxone (NARCAN) nasal spray 4 mg/0.1 mL    oxyCODONE (ROXICODONE) 15 MG immediate release tablet Take 15 mg by mouth.   rosuvastatin (CRESTOR) 20 MG tablet Take 0.5 tablets (10 mg total) by mouth daily.   Semaglutide,0.25 or 0.5MG /DOS, 2 MG/3ML SOPN Inject 0.5 mg into the skin once a week.   Semaglutide,0.25 or 0.5MG /DOS, 2 MG/3ML SOPN Inject 0.25 mg into the skin once a week.   valACYclovir (VALTREX) 500 MG tablet Take 1 tablet (500 mg total) by mouth 3 (three) times daily.   chlorthalidone (HYGROTON) 50 MG tablet Take 1 tablet (50 mg total) by mouth daily.   No facility-administered encounter medications on file as of 06/18/2023.    Allergies (verified) Gabapentin   History: Past Medical History:  Diagnosis Date   Erythrocytosis    Hyperlipidemia    Hypertension    Kidney damage    Type 2 diabetes mellitus (HCC)    Past Surgical History:  Procedure Laterality Date   BREAST CYST ASPIRATION Bilateral    Bilat.  many on both per pt   BREAST CYST EXCISION Left    age 53   JOINT REPLACEMENT     Family History  Problem Relation Age of Onset   Breast cancer Sister 43   Breast cancer Other 73       neice   Social History   Socioeconomic History   Marital status: Single    Spouse name: Not on file   Number of children: Not on file   Years of education: Not on file   Highest education level: Not on file  Occupational History   Not on file  Tobacco Use   Smoking status: Some Days    Current packs/day: 0.00    Average  packs/day: 1.5 packs/day for 35.0 years (52.5 ttl pk-yrs)    Types: Cigarettes    Start date: 02/27/1987    Last attempt to quit: 02/26/2022    Years since quitting: 1.3   Smokeless tobacco: Never   Tobacco comments:    2-3 cigarette a day  Substance and Sexual Activity   Alcohol use: No   Drug use: Never   Sexual activity: Yes  Other Topics Concern   Not on file  Social History Narrative   Not on file   Social Drivers of Health   Financial Resource Strain: Low Risk  (06/18/2023)   Overall Financial Resource Strain (CARDIA)    Difficulty of Paying Living Expenses: Not hard at all  Food Insecurity: No Food Insecurity (06/18/2023)   Hunger Vital Sign    Worried About Running Out of Food in the Last Year: Never true    Ran Out of Food in the Last Year: Never true  Transportation Needs: No Transportation Needs (06/18/2023)   PRAPARE - Administrator, Civil Service (Medical): No    Lack of Transportation (Non-Medical): No  Physical Activity: Sufficiently Active (06/18/2023)   Exercise Vital Sign    Days of Exercise per Week: 7 days    Minutes of Exercise per Session: 60 min  Stress: No Stress Concern Present (06/18/2023)   [employer] of Occupational Health - Occupational Stress Questionnaire    Feeling of Stress : Not at all  Social Connections: Socially Isolated (06/18/2023)   Social Connection and Isolation Panel [NHANES]    Frequency of Communication with Friends and Family: More than three times a week    Frequency of Social Gatherings with Friends and Family: More than three times a week    Attends Religious Services: Never    Database administrator or Organizations: No    Attends Engineer, structural: Never    Marital Status: Divorced    Tobacco Counseling Ready to quit: Not Answered Counseling given: Not Answered Tobacco comments: 2-3 cigarette a day   Clinical Intake:  Pre-visit preparation completed: Yes  Pain : No/denies pain      BMI - recorded: 32.43 Nutritional Status: BMI > 30  Obese Nutritional Risks: None Diabetes: Yes CBG done?: Yes (non fasting 124) CBG resulted in Enter/ Edit results?: No Did pt. bring in CBG monitor from home?: No  How often do you need to have someone help you when you read instructions, pamphlets, or other written materials from your doctor or pharmacy?: 1 - Never  Interpreter Needed?: No  Information entered by :: R. Eberardo Demello LPN   Activities of Daily Living    06/18/2023    2:38 PM  In your present state of health, do you have any difficulty performing the following  activities:  Hearing? 0  Vision? 0  Difficulty concentrating or making decisions? 0  Walking or climbing stairs? 0  Dressing or bathing? 0  Doing errands, shopping? 0  Preparing Food and eating ? N  Using the Toilet? N  In the past six months, have you accidently leaked urine? N  Do you have problems with loss of bowel control? N  Managing your Medications? N  Managing your Finances? N  Housekeeping or managing your Housekeeping? N    Patient Care Team: Kara Dies, NP as PCP - General (Nurse Practitioner)  Indicate any recent Medical Services you may have received from other than [facility] providers in the past year (date may be approximate).     Assessment:   This is a routine wellness examination for Delvin.  Hearing/Vision screen Hearing Screening - Comments:: No issues Vision Screening - Comments:: Picking up glasses soon   Goals Addressed             This Visit's Progress    Patient Stated       Wants to continue to lose weight       Depression Screen    06/18/2023    2:43 PM 05/23/2023   11:07 AM 02/28/2023    1:50 PM 01/03/2022    9:26 AM  PHQ 2/9 Scores  PHQ - 2 Score 0 0  2  PHQ- 9 Score 2   3  Exception Documentation   Patient refusal     Fall Risk    06/18/2023    2:40 PM 02/28/2023    1:49 PM 04/05/2022    9:47 AM 01/03/2022    9:25 AM  Fall Risk   Falls in the  past year? 0 0 0 1  Number falls in past yr: 0 0 0 0  Injury with Fall? 0 0 0 1  Comment    left knee pain  Risk for fall due to : No Fall Risks No Fall Risks  History of fall(s)  Follow up Falls prevention discussed;Falls evaluation completed Falls evaluation completed Falls evaluation completed Falls evaluation completed    MEDICARE RISK AT HOME: Medicare Risk at Home Any stairs in or around the home?: Yes If so, are there any without handrails?: No Home free of loose throw rugs in walkways, pet beds, electrical cords, etc?: Yes Adequate lighting in your home to reduce risk of falls?: Yes Life alert?: No Use of a cane, walker or w/c?: No Grab bars in the bathroom?: Yes Shower chair or bench in shower?: No   Cognitive Function:        06/18/2023    2:50 PM  6CIT Screen  What Year? 0 points  What month? 0 points  What time? 0 points  Count back from 20 0 points  Months in reverse 0 points  Repeat phrase 2 points  Total Score 2 points    Immunizations Immunization History  Administered Date(s) Administered   Influenza Split 04/05/2022   Influenza, Seasonal, Injecte, Preservative Fre 03/08/2005, 04/03/2007, 02/27/2010, 02/11/2012   Tdap 11/26/2010    TDAP status: Due, Education has been provided regarding the importance of this vaccine. Advised may receive this vaccine at local pharmacy or Health Dept. Aware to provide a copy of the vaccination record if obtained from local pharmacy or Health Dept. Verbalized acceptance and understanding.  Flu Vaccine status: Due, Education has been provided regarding the importance of this vaccine. Advised may receive this vaccine at local pharmacy or Health Dept. Aware to provide a  copy of the vaccination record if obtained from local pharmacy or Health Dept. Verbalized acceptance and understanding.  Pneumococcal vaccine status: Declined,  Education has been provided regarding the importance of this vaccine but patient still declined.  Advised may receive this vaccine at local pharmacy or Health Dept. Aware to provide a copy of the vaccination record if obtained from local pharmacy or Health Dept. Verbalized acceptance and understanding.   Covid-19 vaccine status: Declined, Education has been provided regarding the importance of this vaccine but patient still declined. Advised may receive this vaccine at local pharmacy or Health Dept.or vaccine clinic. Aware to provide a copy of the vaccination record if obtained from local pharmacy or Health Dept. Verbalized acceptance and understanding.  Qualifies for Shingles Vaccine? Yes   Zostavax completed No   Shingrix Completed?: No.    Education has been provided regarding the importance of this vaccine. Patient has been advised to call insurance company to determine out of pocket expense if they have not yet received this vaccine. Advised may also receive vaccine at local pharmacy or Health Dept. Verbalized acceptance and understanding.  Screening Tests Health Maintenance  Topic Date Due   Medicare Annual Wellness (AWV)  Never done   Colonoscopy  Never done   DTaP/Tdap/Td (2 - Td or Tdap) 11/25/2020   MAMMOGRAM  04/28/2021   Lung Cancer Screening  02/27/2023   FOOT EXAM  04/06/2023   Zoster Vaccines- Shingrix (1 of 2) 08/21/2023 (Originally 08/20/1974)   INFLUENZA VACCINE  08/25/2023 (Originally 12/26/2022)   Pneumonia Vaccine 69+ Years old (1 of 2 - PCV) 05/22/2024 (Originally 08/19/1961)   HEMOGLOBIN A1C  08/29/2023   OPHTHALMOLOGY EXAM  10/18/2023   Diabetic kidney evaluation - eGFR measurement  02/28/2024   Diabetic kidney evaluation - Urine ACR  05/22/2024   DEXA SCAN  Completed   Hepatitis C Screening  Completed   HPV VACCINES  Aged Out   COVID-19 Vaccine  Discontinued    Health Maintenance  Health Maintenance Due  Topic Date Due   Medicare Annual Wellness (AWV)  Never done   Colonoscopy  Never done   DTaP/Tdap/Td (2 - Td or Tdap) 11/25/2020   MAMMOGRAM  04/28/2021    Lung Cancer Screening  02/27/2023   FOOT EXAM  04/06/2023    Colorectal cancer screening Patient declines  Patient will discuss with PCP    Mammogram status: Completed 04/2019. Repeat every year Order placed 05/23/2023 Patient was given information to call and schedule  Bone Density status: Completed 05/2020. Results reflect: Bone density results: NORMAL. Repeat every 2 years.  Lung Cancer Screening: (Low Dose CT Chest recommended if Age 43-80 years, 20 pack-year currently smoking OR have quit w/in 15years.) does qualify.  Patient was given the telephone number to call.    Additional Screening:  Hepatitis C Screening: does qualify; Completed [place] 11/2016  Vision Screening: Recommended annual ophthalmology exams for early detection of glaucoma and other disorders of the eye. Is the patient up to date with their annual eye exam?  Yes  Who is the provider or what is the name of the office in which the patient attends annual eye exams?  Eye If pt is not established with a provider, would they like to be referred to a provider to establish care? No .   Dental Screening: Recommended annual dental exams for proper oral hygiene  Diabetic Foot Exam: Diabetic Foot Exam: Overdue, Pt has been advised about the importance in completing this exam. Pt is scheduled for diabetic foot  exam on Needs to have and documented at next office visit.  Community Resource Referral / Chronic Care Management: CRR required this visit?  No   CCM required this visit?  No     Plan:     I have personally reviewed and noted the following in the patient's chart:   Medical and social history Use of alcohol, tobacco or illicit drugs  Current medications and supplements including opioid prescriptions. Patient is currently taking opioid prescriptions. Information provided to patient regarding non-opioid alternatives. Patient advised to discuss non-opioid treatment plan with their provider. Functional  ability and status Nutritional status Physical activity Advanced directives List of other physicians Hospitalizations, surgeries, and ER visits in previous 12 months Vitals Screenings to include cognitive, depression, and falls Referrals and appointments  In addition, I have reviewed and discussed with patient certain preventive protocols, quality metrics, and best practice recommendations. A written personalized care plan for preventive services as well as general preventive health recommendations were provided to patient.     Sydell Axon, LPN   [id number]   After Visit Summary: (MyChart) Due to this being a telephonic visit, the after visit summary with patients personalized plan was offered to patient via MyChart   Nurse Notes: None

## 2023-06-30 DIAGNOSIS — Z0000 Encounter for general adult medical examination without abnormal findings: Secondary | ICD-10-CM | POA: Diagnosis not present

## 2023-06-30 DIAGNOSIS — Z79899 Other long term (current) drug therapy: Secondary | ICD-10-CM | POA: Diagnosis not present

## 2023-06-30 DIAGNOSIS — F17210 Nicotine dependence, cigarettes, uncomplicated: Secondary | ICD-10-CM | POA: Diagnosis not present

## 2023-06-30 DIAGNOSIS — E1169 Type 2 diabetes mellitus with other specified complication: Secondary | ICD-10-CM | POA: Diagnosis not present

## 2023-06-30 DIAGNOSIS — M5416 Radiculopathy, lumbar region: Secondary | ICD-10-CM | POA: Diagnosis not present

## 2023-07-03 DIAGNOSIS — Z79899 Other long term (current) drug therapy: Secondary | ICD-10-CM | POA: Diagnosis not present

## 2023-07-07 DIAGNOSIS — J9601 Acute respiratory failure with hypoxia: Secondary | ICD-10-CM | POA: Diagnosis not present

## 2023-07-07 DIAGNOSIS — J206 Acute bronchitis due to rhinovirus: Secondary | ICD-10-CM | POA: Diagnosis not present

## 2023-07-21 ENCOUNTER — Ambulatory Visit: Payer: Self-pay | Admitting: Nurse Practitioner

## 2023-07-21 NOTE — Telephone Encounter (Signed)
  Chief Complaint: itchy rash - possible med reaction Symptoms: itchy rash Frequency: Saturday night Pertinent Negatives: Patient denies facial swelling, sob Disposition: [] ED /[] Urgent Care (no appt availability in office) / [x] Appointment(In office/virtual)/ []  [place] [facility]/ [] Home Care/ [] Refused Recommended Disposition /[] [place] [facility]/ []  Follow-up with PCP Additional Notes: Pt started new dose of Ozempic 0.5 mg on Saturday. By Saturday evening her legs started itching. Pt reports that itchiness has continued and increased. Pt thinks it is from the Ozempic. Pt does not report welts, difficulty breathing  or facial swelling.  Pt will try Benadryl and hydrocortisone cream tonight. Pt will monitor s/s and call EMS if needed. Appt for tomorrow.    Copied from CRM [id number]. Topic: Clinical - Red Word Triage >> Jul 21, 2023  5:28 PM Tiffany H wrote: Red Word that prompted transfer to Nurse Triage: Patient's been titrated to 0.5mg  of Ozempic. Since taking new dosage, patient has started itching around the top of the leg itching, her butt, backs of legs, back of the knee.   Please assist. Reason for Disposition  SEVERE itching (i.e., interferes with sleep, normal activities or school)  Answer Assessment - Initial Assessment Questions 1. APPEARANCE of RASH: "Describe the rash." (e.g., spots, blisters, raised areas, skin peeling, scaly)     Fairly large 2. SIZE: "How big are the spots?" (e.g., tip of pen, eraser, coin; inches, centimeters)     rash 3. LOCATION: "Where is the rash located?"     Legs, buttocks, back of legs 4. COLOR: "What color is the rash?" (Note: It is difficult to assess rash color in people with darker-colored skin. When this situation occurs, simply ask the caller to describe what they see.)      5. ONSET: "When did the rash begin?"     Saturday night 6. FEVER: "Do you have a fever?" If Yes, ask: "What is your temperature, how was it measured, and when  did it start?"     no 7. ITCHING: "Does the rash itch?" If Yes, ask: "How bad is the itch?" (Scale 1-10; or mild, moderate, severe)     moderate 8. CAUSE: "What do you think is causing the rash?"     Possible medication reaction 9. MEDICINE FACTORS: "Have you started any new medicines within the last 2 weeks?" (e.g., antibiotics)      Increased ozempic dosage 10. OTHER SYMPTOMS: "Do you have any other symptoms?" (e.g., dizziness, headache, sore throat, joint pain)       no  Protocols used: Rash or Redness - [facility]

## 2023-07-22 ENCOUNTER — Ambulatory Visit: Payer: 59 | Admitting: Nurse Practitioner

## 2023-07-24 NOTE — Telephone Encounter (Signed)
 Patient states she took Benadryl and the rash is cleared up and gone. Patient states she will take another Ozempic shot on Saturday.

## 2023-07-24 NOTE — Telephone Encounter (Signed)
 Please call pt to check how she is doing

## 2023-07-28 DIAGNOSIS — E1169 Type 2 diabetes mellitus with other specified complication: Secondary | ICD-10-CM | POA: Diagnosis not present

## 2023-07-28 DIAGNOSIS — F17210 Nicotine dependence, cigarettes, uncomplicated: Secondary | ICD-10-CM | POA: Diagnosis not present

## 2023-07-28 DIAGNOSIS — M5416 Radiculopathy, lumbar region: Secondary | ICD-10-CM | POA: Diagnosis not present

## 2023-07-28 DIAGNOSIS — E559 Vitamin D deficiency, unspecified: Secondary | ICD-10-CM | POA: Diagnosis not present

## 2023-07-28 DIAGNOSIS — Z79899 Other long term (current) drug therapy: Secondary | ICD-10-CM | POA: Diagnosis not present

## 2023-07-28 DIAGNOSIS — E7800 Pure hypercholesterolemia, unspecified: Secondary | ICD-10-CM | POA: Diagnosis not present

## 2023-07-28 DIAGNOSIS — R5383 Other fatigue: Secondary | ICD-10-CM | POA: Diagnosis not present

## 2023-07-31 DIAGNOSIS — Z79899 Other long term (current) drug therapy: Secondary | ICD-10-CM | POA: Diagnosis not present

## 2023-08-03 ENCOUNTER — Telehealth: Payer: Self-pay | Admitting: Nurse Practitioner

## 2023-08-04 DIAGNOSIS — J206 Acute bronchitis due to rhinovirus: Secondary | ICD-10-CM | POA: Diagnosis not present

## 2023-08-04 DIAGNOSIS — J9601 Acute respiratory failure with hypoxia: Secondary | ICD-10-CM | POA: Diagnosis not present

## 2023-08-12 ENCOUNTER — Ambulatory Visit: Payer: Self-pay | Admitting: Nurse Practitioner

## 2023-08-12 NOTE — Telephone Encounter (Signed)
 Copied from CRM [id number]. Topic: Clinical - Red Word Triage >> Aug 12, 2023  1:26 PM Martinique E wrote: [facility] that prompted transfer to Nurse Triage: Patient experiencing a rash on her back, chest, and left arm (unaware if this is an allergic reaction). Patient states the rash is welts that are red, inflamed, and itchy. Rash started 08/10/2023.  Chief Complaint: rash to back, chest, and left arm, red and inflamed, itchy Symptoms: see above Frequency: constant Pertinent Negatives: Patient denies fever, sob, joint pain Disposition: [] ED /[] Urgent Care (no appt availability in office) / [x] Appointment(In office/virtual)/ []  [place] [facility]/ [] Home Care/ [] Refused Recommended Disposition /[] [place] [facility]/ []  Follow-up with PCP Additional Notes: apt made for tomorrow; care advice given, denies questions; instructed to go to ER if becomes worse.   Reason for Disposition  Mild widespread rash  (Exception: Heat rash lasting 3 days or less.)  Answer Assessment - Initial Assessment Questions 1. APPEARANCE of RASH: "Describe the rash." (e.g., spots, blisters, raised areas, skin peeling, scaly)     Rash to back, chest and left arm 2. SIZE: "How big are the spots?" (e.g., tip of pen, eraser, coin; inches, centimeters)     Welts, red and inflamed, scratch marks is what they look like 3. LOCATION: "Where is the rash located?"     See above 4. COLOR: "What color is the rash?" (Note: It is difficult to assess rash color in people with darker-colored skin. When this situation occurs, simply ask the caller to describe what they see.)     red 5. ONSET: "When did the rash begin?"     08/10/23 6. FEVER: "Do you have a fever?" If Yes, ask: "What is your temperature, how was it measured, and when did it start?"     denies 7. ITCHING: "Does the rash itch?" If Yes, ask: "How bad is the itch?" (Scale 1-10; or mild, moderate, severe)     Barely itching 8. CAUSE: "What do you think is causing the  rash?"     unknown 9. MEDICINE FACTORS: "Have you started any new medicines within the last 2 weeks?" (e.g., antibiotics)      denies 10. OTHER SYMPTOMS: "Do you have any other symptoms?" (e.g., dizziness, headache, sore throat, joint pain)       denies 11. PREGNANCY: "Is there any chance you are pregnant?" "When was your last menstrual period?"       na  Protocols used: Rash or Redness - [facility]

## 2023-08-13 ENCOUNTER — Ambulatory Visit (INDEPENDENT_AMBULATORY_CARE_PROVIDER_SITE_OTHER): Admitting: Internal Medicine

## 2023-08-13 ENCOUNTER — Encounter: Payer: Self-pay | Admitting: Internal Medicine

## 2023-08-13 VITALS — BP 128/78 | HR 84 | Temp 97.9°F | Ht 68.0 in | Wt 186.8 lb

## 2023-08-13 DIAGNOSIS — R21 Rash and other nonspecific skin eruption: Secondary | ICD-10-CM | POA: Diagnosis not present

## 2023-08-13 MED ORDER — BETAMETHASONE DIPROPIONATE 0.05 % EX CREA
TOPICAL_CREAM | Freq: Two times a day (BID) | CUTANEOUS | 0 refills | Status: AC
Start: 2023-08-13 — End: ?

## 2023-08-13 NOTE — Progress Notes (Signed)
 Acute Office Visit  Subjective:     Patient ID: Helen Benson, female    DOB: 23-Jan-1956, 68 y.o.   MRN: [id number]  Chief Complaint  Patient presents with   Acute Visit    Rash on chest, back, bilateral arm & legs since Sunday  Itchy    HPI Patient is in today for a rash beginning over her back/chest/arms and her legs which started on Sunday.  Patient states that she recently changed her detergent to 1 with a new scent.  Since then, patient has noted increased itching and developed a rash which is red and itchy.  No fevers or chills.  No oral lesions.  No wheezing.  No lightheadedness or syncope.  No recent viral infection.  Patient states that she tried using calamine lotion without improvement.  She then tried hydrocortisone with improvement in the redness over some of her rashes.  She also states that she started Ozempic in December but has been on this medication since then and has not changed her dose recently.  She denies any other complaints at this time.  Review of Systems  Constitutional: Negative.   Respiratory: Negative.    Cardiovascular: Negative.   Musculoskeletal: Negative.   Skin:  Positive for itching and rash.  Neurological: Negative.         Objective:    BP 128/78   Pulse 84   Temp 97.9 F (36.6 C)   Ht 5\' 8"  (1.727 m)   Wt 186 lb 12.8 oz (84.7 kg)   SpO2 97%   BMI 28.40 kg/m    Physical Exam Constitutional:      Appearance: Normal appearance.  HENT:     Head: Normocephalic and atraumatic.     Mouth/Throat:     Pharynx: Oropharynx is clear. No oropharyngeal exudate or posterior oropharyngeal erythema.  Cardiovascular:     Rate and Rhythm: Normal rate and regular rhythm.     Heart sounds: Normal heart sounds.  Pulmonary:     Breath sounds: Normal breath sounds. No wheezing or rales.  Skin:    Findings: Erythema and rash present. No ecchymosis. Rash is macular and papular. Rash is not vesicular.          Comments: Erythematous  maculopapular rash noted in multiple areas over her bilateral arms, lower back as well as upper back.  Excoriations noted as well.  Neurological:     Mental Status: She is alert and oriented to person, place, and time.  Psychiatric:        Mood and Affect: Mood normal.        Behavior: Behavior normal.     No results found for any visits on 08/13/23.      Assessment & Plan:   Problem List Items Addressed This Visit       Musculoskeletal and Integument   Rash - Primary   -Patient presents today with an itchy rash beginning on Sunday over her back/chest/arms as well as a little bit over the legs -She states that the rash is red and had no improvement with calamine lotion but did improve with hydrocortisone cream -On exam, patient was noted to have an erythematous raised rash with excoriations over her back - upper (near the scapula bilaterally) and lower as well as over her arms and chest (over her sternum) -I suspect that this is secondary to an allergic reaction to her change in detergent -I have prescribed betamethasone cream and patient will increase her Allegra to twice daily  to help with the rash -She will wash all her clothes and bed sheets in her old detergent before using them -I also advised her to use calamine lotion as well as cool compresses or cool showers to help with her itching -Patient to contact us if her rash is not improving in the next 2 to 3 days.      Relevant Medications   betamethasone dipropionate 0.05 % cream    Meds ordered this encounter  Medications   betamethasone dipropionate 0.05 % cream    Sig: Apply topically 2 (two) times daily.    Dispense:  30 g    Refill:  0    No follow-ups on file.  Earl Lagos, MD

## 2023-08-13 NOTE — Assessment & Plan Note (Signed)
-  Patient presents today with an itchy rash beginning on Sunday over her back/chest/arms as well as a little bit over the legs -She states that the rash is red and had no improvement with calamine lotion but did improve with hydrocortisone cream -On exam, patient was noted to have an erythematous raised rash with excoriations over her back - upper (near the scapula bilaterally) and lower as well as over her arms and chest (over her sternum) -I suspect that this is secondary to an allergic reaction to her change in detergent -I have prescribed betamethasone cream and patient will increase her Allegra to twice daily to help with the rash -She will wash all her clothes and bed sheets in her old detergent before using them -I also advised her to use calamine lotion as well as cool compresses or cool showers to help with her itching -Patient to contact us if her rash is not improving in the next 2 to 3 days.

## 2023-08-13 NOTE — Patient Instructions (Signed)
-  It was a pleasure meeting you today -I suspect that your skin rash is an allergy secondary to the change in your detergent -Please go back to your old detergent and wash all the clothes and bed sheets/comforters with your old detergent -I have prescribed betamethasone to be applied to affected areas twice daily till the rash resolves -Increase your Allegra to 2 times daily till rash resolves and then go back to once daily -You can use calamine lotion to help with itching -You can try cold showers or cold compresses to help with itching as well -If the rash is not improving or is worsening despite the above measures please contact us for follow-up

## 2023-08-27 DIAGNOSIS — E1169 Type 2 diabetes mellitus with other specified complication: Secondary | ICD-10-CM | POA: Diagnosis not present

## 2023-08-27 DIAGNOSIS — R5383 Other fatigue: Secondary | ICD-10-CM | POA: Diagnosis not present

## 2023-08-27 DIAGNOSIS — Z79899 Other long term (current) drug therapy: Secondary | ICD-10-CM | POA: Diagnosis not present

## 2023-08-27 DIAGNOSIS — F17210 Nicotine dependence, cigarettes, uncomplicated: Secondary | ICD-10-CM | POA: Diagnosis not present

## 2023-08-27 DIAGNOSIS — M5416 Radiculopathy, lumbar region: Secondary | ICD-10-CM | POA: Diagnosis not present

## 2023-08-29 ENCOUNTER — Ambulatory Visit: Payer: 59 | Admitting: Nurse Practitioner

## 2023-08-29 DIAGNOSIS — Z79899 Other long term (current) drug therapy: Secondary | ICD-10-CM | POA: Diagnosis not present

## 2023-09-01 ENCOUNTER — Other Ambulatory Visit: Payer: Self-pay | Admitting: Nurse Practitioner

## 2023-09-01 DIAGNOSIS — E119 Type 2 diabetes mellitus without complications: Secondary | ICD-10-CM

## 2023-09-02 ENCOUNTER — Ambulatory Visit: Admitting: Nurse Practitioner

## 2023-09-04 ENCOUNTER — Telehealth: Payer: Self-pay | Admitting: Nurse Practitioner

## 2023-09-04 DIAGNOSIS — J9601 Acute respiratory failure with hypoxia: Secondary | ICD-10-CM | POA: Diagnosis not present

## 2023-09-04 DIAGNOSIS — J206 Acute bronchitis due to rhinovirus: Secondary | ICD-10-CM | POA: Diagnosis not present

## 2023-09-04 NOTE — Telephone Encounter (Signed)
 Copied from CRM [id number]. Topic: Clinical - Medication Refill >> Sep 04, 2023  9:51 AM Darl Householder wrote: Most Recent Primary Care Visit:  Provider: Earl Lagos  Department: [facility]  Visit Type: ACUTE  Date: 08/13/2023  Medication: cHIROCHALIDONE 50 MG   Has the patient contacted their pharmacy? Yes (Agent: If no, request that the patient contact the pharmacy for the refill. If patient does not wish to contact the pharmacy document the reason why and proceed with request.) (Agent: If yes, when and what did the pharmacy advise?)  Is this the correct pharmacy for this prescription? Yes If no, delete pharmacy and type the correct one.  This is the patient's preferred pharmacy:  [facility] - [place], [place] - [address] [place] [address] Phone: [phone] Fax: [phone]   Has the prescription been filled recently? No  Is the patient out of the medication? Yes  Has the patient been seen for an appointment in the last year OR does the patient have an upcoming appointment? Yes  Can we respond through MyChart? Yes  Agent: Please be advised that Rx refills may take up to 3 business days. We ask that you follow-up with your pharmacy.

## 2023-09-05 ENCOUNTER — Ambulatory Visit: Admitting: Nurse Practitioner

## 2023-09-08 NOTE — Telephone Encounter (Signed)
 Copied from CRM [id number]. Topic: Clinical - Medication Refill >> Sep 04, 2023  9:51 AM Danae Duncans wrote: Most Recent Primary Care Visit:  Provider: Jeffie Mingle  Department: [facility]  Visit Type: ACUTE  Date: 08/13/2023  Medication: cHIROCHALIDONE 50 MG   Has the patient contacted their pharmacy? Yes (Agent: If no, request that the patient contact the pharmacy for the refill. If patient does not wish to contact the pharmacy document the reason why and proceed with request.) (Agent: If yes, when and what did the pharmacy advise?)  Is this the correct pharmacy for this prescription? Yes If no, delete pharmacy and type the correct one.  This is the patient's preferred pharmacy:  [facility] - [place], [place] - [address] [place] [address] Phone: [phone] Fax: [phone]   Has the prescription been filled recently? No  Is the patient out of the medication? Yes  Has the patient been seen for an appointment in the last year OR does the patient have an upcoming appointment? Yes  Can we respond through MyChart? Yes  Agent: Please be advised that Rx refills may take up to 3 business days. We ask that you follow-up with your pharmacy. >> Sep 08, 2023 12:16 PM Emylou G wrote: Ethyl Hering w/[facility] called [phone] Checking status of this refill.

## 2023-09-09 ENCOUNTER — Other Ambulatory Visit: Payer: Self-pay | Admitting: Nurse Practitioner

## 2023-09-09 ENCOUNTER — Telehealth: Payer: Self-pay

## 2023-09-09 MED ORDER — CHLORTHALIDONE 25 MG PO TABS: 25.0000 mg | ORAL_TABLET | Freq: Every day | ORAL | 1 refills | Status: DC

## 2023-09-09 NOTE — Progress Notes (Signed)
 Chlorthalidone 25 mg has been refilled not increasing to 50 mg due to  h/o hypokalemia and hypercalcemia. Decreased to 25 mg by her nephrologist.

## 2023-09-09 NOTE — Telephone Encounter (Signed)
 Spoke with Patient regarding the Chlorthalidone and she understands and is agreeable.

## 2023-09-09 NOTE — Telephone Encounter (Signed)
 Please inform pt that Chlorthalidone 25 mg has been refilled do not increase to 50 mg h/o hypokalemia and hypercalcemia.

## 2023-09-09 NOTE — Telephone Encounter (Signed)
 Copied from CRM [id number]. Topic: General - Other >> Sep 09, 2023  8:41 AM Caliyah H wrote: Reason for CRM: Patient returned a call regarding a missed phone call from this morning.   Returned Patient's call to let her know that Tona Francis refilled the Chlorthalidone 25 mg but do not increase to 50 mg due to history of hypokalemia and hypercalcemia. Nephrologist decreased the Chlorthalidone to 25 mg. Patient is agreeable to stay at the 25 mg of Chlorthalidone.

## 2023-09-09 NOTE — Telephone Encounter (Signed)
 Attempted to call Patient no answer or voicemail. Will try again later today.

## 2023-10-02 DIAGNOSIS — E1169 Type 2 diabetes mellitus with other specified complication: Secondary | ICD-10-CM | POA: Diagnosis not present

## 2023-10-02 DIAGNOSIS — Z79899 Other long term (current) drug therapy: Secondary | ICD-10-CM | POA: Diagnosis not present

## 2023-10-02 DIAGNOSIS — M5416 Radiculopathy, lumbar region: Secondary | ICD-10-CM | POA: Diagnosis not present

## 2023-10-02 DIAGNOSIS — R5383 Other fatigue: Secondary | ICD-10-CM | POA: Diagnosis not present

## 2023-10-02 DIAGNOSIS — F17210 Nicotine dependence, cigarettes, uncomplicated: Secondary | ICD-10-CM | POA: Diagnosis not present

## 2023-10-07 DIAGNOSIS — Z79899 Other long term (current) drug therapy: Secondary | ICD-10-CM | POA: Diagnosis not present

## 2023-10-21 ENCOUNTER — Ambulatory Visit
Admission: RE | Admit: 2023-10-21 | Discharge: 2023-10-21 | Disposition: A | Discharge status: Home / Self Care | Source: Ambulatory Visit | Attending: Nurse Practitioner | Admitting: Nurse Practitioner

## 2023-10-21 DIAGNOSIS — Z1231 Encounter for screening mammogram for malignant neoplasm of breast: Secondary | ICD-10-CM | POA: Insufficient documentation

## 2023-10-22 ENCOUNTER — Encounter

## 2023-10-24 ENCOUNTER — Other Ambulatory Visit: Payer: Self-pay | Admitting: Nurse Practitioner

## 2023-10-24 DIAGNOSIS — R928 Other abnormal and inconclusive findings on diagnostic imaging of breast: Secondary | ICD-10-CM

## 2023-10-30 DIAGNOSIS — E1169 Type 2 diabetes mellitus with other specified complication: Secondary | ICD-10-CM | POA: Diagnosis not present

## 2023-10-30 DIAGNOSIS — F17210 Nicotine dependence, cigarettes, uncomplicated: Secondary | ICD-10-CM | POA: Diagnosis not present

## 2023-10-30 DIAGNOSIS — M5416 Radiculopathy, lumbar region: Secondary | ICD-10-CM | POA: Diagnosis not present

## 2023-10-30 DIAGNOSIS — R030 Elevated blood-pressure reading, without diagnosis of hypertension: Secondary | ICD-10-CM | POA: Diagnosis not present

## 2023-10-30 DIAGNOSIS — Z79899 Other long term (current) drug therapy: Secondary | ICD-10-CM | POA: Diagnosis not present

## 2023-10-31 ENCOUNTER — Inpatient Hospital Stay: Admission: RE | Admit: 2023-10-31 | Source: Ambulatory Visit

## 2023-10-31 ENCOUNTER — Other Ambulatory Visit

## 2023-11-03 DIAGNOSIS — Z79899 Other long term (current) drug therapy: Secondary | ICD-10-CM | POA: Diagnosis not present

## 2023-11-06 ENCOUNTER — Ambulatory Visit
Admission: RE | Admit: 2023-11-06 | Discharge: 2023-11-06 | Disposition: A | Discharge status: Home / Self Care | Source: Ambulatory Visit | Attending: Nurse Practitioner | Admitting: Nurse Practitioner

## 2023-11-06 DIAGNOSIS — R928 Other abnormal and inconclusive findings on diagnostic imaging of breast: Secondary | ICD-10-CM | POA: Insufficient documentation

## 2023-11-06 DIAGNOSIS — N6321 Unspecified lump in the left breast, upper outer quadrant: Secondary | ICD-10-CM | POA: Diagnosis not present

## 2023-11-06 DIAGNOSIS — R92333 Mammographic heterogeneous density, bilateral breasts: Secondary | ICD-10-CM | POA: Diagnosis not present

## 2023-11-17 ENCOUNTER — Other Ambulatory Visit: Payer: Self-pay | Admitting: Nurse Practitioner

## 2023-11-17 DIAGNOSIS — E119 Type 2 diabetes mellitus without complications: Secondary | ICD-10-CM

## 2023-11-25 DIAGNOSIS — E1169 Type 2 diabetes mellitus with other specified complication: Secondary | ICD-10-CM | POA: Diagnosis not present

## 2023-11-25 DIAGNOSIS — F17210 Nicotine dependence, cigarettes, uncomplicated: Secondary | ICD-10-CM | POA: Diagnosis not present

## 2023-11-25 DIAGNOSIS — R030 Elevated blood-pressure reading, without diagnosis of hypertension: Secondary | ICD-10-CM | POA: Diagnosis not present

## 2023-11-25 DIAGNOSIS — M5416 Radiculopathy, lumbar region: Secondary | ICD-10-CM | POA: Diagnosis not present

## 2023-11-25 DIAGNOSIS — Z79899 Other long term (current) drug therapy: Secondary | ICD-10-CM | POA: Diagnosis not present

## 2023-11-27 DIAGNOSIS — Z79899 Other long term (current) drug therapy: Secondary | ICD-10-CM | POA: Diagnosis not present

## 2023-12-01 ENCOUNTER — Other Ambulatory Visit: Payer: Self-pay | Admitting: Nurse Practitioner

## 2023-12-09 ENCOUNTER — Encounter: Payer: Self-pay | Admitting: Nurse Practitioner

## 2023-12-09 ENCOUNTER — Ambulatory Visit: Admitting: Nurse Practitioner

## 2023-12-09 VITALS — BP 140/84 | HR 76 | Temp 97.5°F | Ht 68.0 in | Wt 192.2 lb

## 2023-12-09 DIAGNOSIS — Z1211 Encounter for screening for malignant neoplasm of colon: Secondary | ICD-10-CM

## 2023-12-09 DIAGNOSIS — E785 Hyperlipidemia, unspecified: Secondary | ICD-10-CM

## 2023-12-09 DIAGNOSIS — E139 Other specified diabetes mellitus without complications: Secondary | ICD-10-CM | POA: Diagnosis not present

## 2023-12-09 DIAGNOSIS — I10 Essential (primary) hypertension: Secondary | ICD-10-CM | POA: Diagnosis not present

## 2023-12-09 DIAGNOSIS — R911 Solitary pulmonary nodule: Secondary | ICD-10-CM

## 2023-12-09 DIAGNOSIS — Z716 Tobacco abuse counseling: Secondary | ICD-10-CM

## 2023-12-09 DIAGNOSIS — E119 Type 2 diabetes mellitus without complications: Secondary | ICD-10-CM

## 2023-12-09 MED ORDER — AMLODIPINE BESYLATE 5 MG PO TABS: 5.0000 mg | ORAL_TABLET | Freq: Every day | ORAL | 1 refills | Status: DC

## 2023-12-09 MED ORDER — NICOTINE 14 MG/24HR TD PT24: 14.0000 mg | MEDICATED_PATCH | Freq: Every day | TRANSDERMAL | 1 refills | Status: DC

## 2023-12-09 MED ORDER — SEMAGLUTIDE (1 MG/DOSE) 4 MG/3ML ~~LOC~~ SOPN: 1.0000 mg | PEN_INJECTOR | SUBCUTANEOUS | 4 refills | Status: DC

## 2023-12-09 MED ORDER — LEVOTHYROXINE SODIUM 75 MCG PO TABS: 75.0000 ug | ORAL_TABLET | Freq: Every day | ORAL | 2 refills | Status: DC

## 2023-12-09 MED ORDER — ATENOLOL 25 MG PO TABS: 25.0000 mg | ORAL_TABLET | Freq: Every day | ORAL | 1 refills | Status: DC

## 2023-12-09 MED ORDER — CHLORTHALIDONE 25 MG PO TABS: 25.0000 mg | ORAL_TABLET | Freq: Every day | ORAL | 1 refills | Status: DC

## 2023-12-09 MED ORDER — LOSARTAN POTASSIUM 100 MG PO TABS: 100.0000 mg | ORAL_TABLET | Freq: Every day | ORAL | 3 refills | Status: DC

## 2023-12-09 MED ORDER — FARXIGA 10 MG PO TABS: 10.0000 mg | ORAL_TABLET | Freq: Every day | ORAL | 3 refills | Status: AC

## 2023-12-09 NOTE — Progress Notes (Signed)
 Established Patient Office Visit  Subjective:  Patient ID: Helen Benson, female    DOB: Jun 29, 1955  Age: 68 y.o. MRN: [id number]  CC:  Chief Complaint  Patient presents with   Medical Management of Chronic Issues  Discussed the use of a AI scribe software for clinical note transcription with the patient, who gave verbal consent to proceed.   HPI  Helen Benson is a 68 year old female who presents for a regular appointment and medication refills.  Her hemoglobin A1c 7.3 on 02/28/2023.  She is on Ozempic  0.5 mg and Farxiga  for diabetes. She reports increased hunger. She takes Crestor  for cholesterol.  She is followed by [facility] pain management on oxycodone  and  Xanax  0.5 mg.  Her blood pressure is managed with amlodipine , atenolol  25 mg, and losartan , which she ran out of yesterday. She also takes chlorthalidone  25 mg.   She is interested in quitting smoking, currently smoking two to three cigarettes a day, and wants to use nicotine  patches.  Patient denies chest pain, shortness of breath or cough.  CT angiography chest with contrast on 10/23:  Bilateral pulmonary nodules are noted, the largest measuring 6 mm in the left lower lobe.   HPI   Past Medical History:  Diagnosis Date   Erythrocytosis    Hyperlipidemia    Hypertension    Kidney damage    Type 2 diabetes mellitus (HCC)     Past Surgical History:  Procedure Laterality Date   BREAST CYST ASPIRATION Bilateral    Bilat. many on both per pt   BREAST CYST EXCISION Left    age 69   JOINT REPLACEMENT      Family History  Problem Relation Age of Onset   Breast cancer Sister 11   Breast cancer Other 95       neice    Social History   Socioeconomic History   Marital status: Single    Spouse name: Not on file   Number of children: Not on file   Years of education: Not on file   Highest education level: 9th grade  Occupational History   Not on file  Tobacco Use   Smoking status: Some Days     Current packs/day: 0.00    Average packs/day: 1.5 packs/day for 35.0 years (52.5 ttl pk-yrs)    Types: Cigarettes    Start date: 02/27/1987    Last attempt to quit: 02/26/2022    Years since quitting: 1.8   Smokeless tobacco: Never   Tobacco comments:    2-3 cigarette a day  Substance and Sexual Activity   Alcohol  use: No   Drug use: Never   Sexual activity: Yes  Other Topics Concern   Not on file  Social History Narrative   Not on file   Social Drivers of Health   Financial Resource Strain: Low Risk  (12/06/2023)   Overall Financial Resource Strain (CARDIA)    Difficulty of Paying Living Expenses: Not hard at all  Food Insecurity: Food Insecurity Present (12/06/2023)   Hunger Vital Sign    Worried About Running Out of Food in the Last Year: Sometimes true    Ran Out of Food in the Last Year: Never true  Transportation Needs: Unmet Transportation Needs (12/06/2023)   PRAPARE - Administrator, Civil Service (Medical): Yes    Lack of Transportation (Non-Medical): No  Physical Activity: Sufficiently Active (12/06/2023)   Exercise Vital Sign    Days of Exercise per Week: 7  days    Minutes of Exercise per Session: 30 min  Stress: No Stress Concern Present (12/06/2023)   [employer] of Occupational Health - Occupational Stress Questionnaire    Feeling of Stress: Only a little  Social Connections: Socially Isolated (12/06/2023)   Social Connection and Isolation Panel    Frequency of Communication with Friends and Family: More than three times a week    Frequency of Social Gatherings with Friends and Family: Twice a week    Attends Religious Services: Never    Database administrator or Organizations: No    Attends Engineer, structural: Not on file    Marital Status: Divorced  Intimate Partner Violence: Not At Risk (06/18/2023)   Humiliation, Afraid, Rape, and Kick questionnaire    Fear of Current or Ex-Partner: No    Emotionally Abused: No    Physically  Abused: No    Sexually Abused: No     Outpatient Medications Prior to Visit  Medication Sig Dispense Refill   ALPRAZolam  (XANAX ) 0.5 MG tablet TAKE 1 TABLET BY MOUTH TWICE DAILY AS NEEDED FOR ANXIETY 60 tablet 0   betamethasone  dipropionate 0.05 % cream Apply topically 2 (two) times daily. 30 g 0   Cholecalciferol (VITAMIN D -3 PO) Take 1 capsule by mouth daily at 2 PM.     Loratadine (ALLERGY RELIEF 24-HR PO) Take 1 tablet by mouth daily at 2 PM.     Misc. Devices (FINGERTIP PULSE OXIMETER) MISC 1 each by Does not apply route as directed. 1 each 0   naloxone (NARCAN) nasal spray 4 mg/0.1 mL      oxyCODONE  (ROXICODONE ) 15 MG immediate release tablet Take 15 mg by mouth.     OZEMPIC , 0.25 OR 0.5 MG/DOSE, 2 MG/3ML SOPN INJECT 0.5MG  SUBCUTANEOUSLY ONCE A WEEK 3 mL 2   rosuvastatin  (CRESTOR ) 20 MG tablet Take 0.5 tablets (10 mg total) by mouth daily.     valACYclovir  (VALTREX ) 500 MG tablet Take 1 tablet (500 mg total) by mouth 3 (three) times daily. 21 tablet 0   amLODipine  (NORVASC ) 5 MG tablet Take 1 tablet by mouth daily.     atenolol  (TENORMIN ) 25 MG tablet Take 1 tablet (25 mg total) by mouth daily. 90 tablet 3   chlorthalidone  (HYGROTON ) 25 MG tablet TAKE 1 TABLET BY MOUTH ONCE DAILY 30 tablet 0   levothyroxine  (SYNTHROID ) 75 MCG tablet TAKE 1 TABLET BY MOUTH ONCE DAILY ON AN EMPTY STOMACH. WAIT 30 MINUTES BEFORE TAKING OTHER MEDS. 90 tablet 1   losartan  (COZAAR ) 100 MG tablet Take 1 tablet (100 mg total) by mouth daily. 90 tablet 1   FARXIGA  10 MG TABS tablet Take 1 tablet (10 mg total) by mouth daily. 90 tablet 1   No facility-administered medications prior to visit.    Allergies  Allergen Reactions   Gabapentin Other (See Comments)    Had psychotic episodes      ROS Review of Systems Negative unless indicated in HPI.    Objective:    Physical Exam Constitutional:      Appearance: Normal appearance.  HENT:     Mouth/Throat:     Mouth: Mucous membranes are moist.   Eyes:     Conjunctiva/sclera: Conjunctivae normal.     Pupils: Pupils are equal, round, and reactive to light.  Cardiovascular:     Rate and Rhythm: Normal rate and regular rhythm.     Pulses: Normal pulses.     Heart sounds: Normal heart sounds.  Pulmonary:  Effort: Pulmonary effort is normal.     Breath sounds: Normal breath sounds.  Abdominal:     General: Bowel sounds are normal.     Palpations: Abdomen is soft.  Musculoskeletal:     Cervical back: Normal range of motion. No tenderness.  Skin:    General: Skin is warm.     Findings: No bruising.  Neurological:     General: No focal deficit present.     Mental Status: She is alert and oriented to person, place, and time. Mental status is at baseline.  Psychiatric:        Mood and Affect: Mood normal.        Behavior: Behavior normal.        Thought Content: Thought content normal.        Judgment: Judgment normal.     BP (!) 140/84   Pulse 76   Temp (!) 97.5 F (36.4 C)   Ht 5' 8 (1.727 m)   Wt 192 lb 3.2 oz (87.2 kg)   SpO2 97%   BMI 29.22 kg/m  Wt Readings from Last 3 Encounters:  12/23/23 192 lb 6.4 oz (87.3 kg)  12/09/23 192 lb 3.2 oz (87.2 kg)  08/13/23 186 lb 12.8 oz (84.7 kg)     Health Maintenance  Topic Date Due   Zoster Vaccines- Shingrix (1 of 2) Never done   Colonoscopy  Never done   Hepatitis B Vaccines (3 of 3 - Risk 3-dose series) 06/18/2017   DTaP/Tdap/Td (2 - Td or Tdap) 11/25/2020   Lung Cancer Screening  02/27/2023   FOOT EXAM  04/06/2023   OPHTHALMOLOGY EXAM  10/18/2023   Pneumococcal Vaccine: 50+ Years (1 of 2 - PCV) 05/22/2024 (Originally 08[birth year]20[birth year]1974)   INFLUENZA VACCINE  12/26/2023   HEMOGLOBIN A1C  06/10/2024   Medicare Annual Wellness (AWV)  06/17/2024   Diabetic kidney evaluation - eGFR measurement  12/08/2024   Diabetic kidney evaluation - Urine ACR  12/08/2024   MAMMOGRAM  10/20/2025   DEXA SCAN  Completed   Hepatitis C Screening  Completed   HPV VACCINES  Aged Out    Meningococcal B Vaccine  Aged Out   COVID-19 Vaccine  Discontinued       Topic Date Due   Hepatitis B Vaccines (3 of 3 - Risk 3-dose series) 06/18/2017    Lab Results  Component Value Date   TSH 1.71 05/23/2023   Lab Results  Component Value Date   WBC 9.7 12/09/2023   HGB 15.2 (H) 12/09/2023   HCT 45.4 12/09/2023   MCV 93.6 12/09/2023   PLT 266.0 12/09/2023   Lab Results  Component Value Date   NA 140 12/09/2023   K 3.9 12/09/2023   CO2 31 12/09/2023   GLUCOSE 96 12/09/2023   BUN 17 12/09/2023   CREATININE 1.02 12/09/2023   BILITOT 0.5 12/09/2023   ALKPHOS 63 12/09/2023   AST 15 12/09/2023   ALT 17 12/09/2023   PROT 7.1 12/09/2023   ALBUMIN 4.4 12/09/2023   CALCIUM  10.0 12/09/2023   ANIONGAP 7 03/05/2022   EGFR 59 (L) 01/03/2022   GFR 56.61 (L) 12/09/2023   Lab Results  Component Value Date   CHOL 139 12/09/2023   Lab Results  Component Value Date   HDL 36.00 (L) 12/09/2023   Lab Results  Component Value Date   LDLCALC 70 12/09/2023   Lab Results  Component Value Date   TRIG 163.0 (H) 12/09/2023   Lab Results  Component Value Date  CHOLHDL 4 12/09/2023   Lab Results  Component Value Date   HGBA1C 6.2 12/09/2023      Assessment & Plan:  Diabetes 1.5, managed as type 2 (HCC) Assessment & Plan: Last A1c 7.3 on 10 /4/24.  Compliant with medication. -Will check hemoglobin A1c. - Continue Ozempic  0.5 mg and Farxiga  10 mg daily.   Orders: -     Microalbumin / creatinine urine ratio -     Hemoglobin A1c -     Comprehensive metabolic panel with GFR  Dyslipidemia Assessment & Plan: Continue Crestor . -Check lipid panel.   Orders: -     Lipid panel  Primary hypertension Assessment & Plan: Blood pressure is elevated today.  Patient said that she has not taken her medication. -Continue amlodipine , atenolol , chlorthalidone  and losartan . -Will continue to monitor  Orders: -     Losartan  Potassium; Take 1 tablet (100 mg total) by mouth  daily.  Dispense: 90 tablet; Refill: 3 -     CBC with Differential/Platelet  Lung nodule seen on imaging study Assessment & Plan: Multiple nodules in bilateral lungs, largest 6 mm nodule in left lung. - Will refer to pulmonology to monitor lung nodules.  Orders: -     Pulmonary Visit  Encounter for screening colonoscopy -     Ambulatory referral to Gastroenterology  Encounter for smoking cessation counseling Assessment & Plan: Smokes 2-3 cigarettes/day, interested in nicotine  patches for cessation. - Prescribe nicotine  patches.   Other orders -     Farxiga ; Take 1 tablet (10 mg total) by mouth daily.  Dispense: 90 tablet; Refill: 3 -     Semaglutide  (1 MG/DOSE); Inject 1 mg as directed once a week.  Dispense: 3 mL; Refill: 4 -     amLODIPine  Besylate; Take 1 tablet (5 mg total) by mouth daily.  Dispense: 90 tablet; Refill: 1 -     Atenolol ; Take 1 tablet (25 mg total) by mouth daily.  Dispense: 90 tablet; Refill: 1 -     Chlorthalidone ; Take 1 tablet (25 mg total) by mouth daily.  Dispense: 90 tablet; Refill: 1 -     Levothyroxine  Sodium; Take 1 tablet (75 mcg total) by mouth daily before breakfast.  Dispense: 90 tablet; Refill: 2 -     Nicotine ; Place 1 patch (14 mg total) onto the skin daily.  Dispense: 90 patch; Refill: 1    Follow-up: Return in about 6 months (around 06/10/2024) for chronic management.   Liliyana Thobe, NP

## 2023-12-10 LAB — COMPREHENSIVE METABOLIC PANEL WITH GFR
ALT: 17 U/L (ref 0–35)
AST: 15 U/L (ref 0–37)
Albumin: 4.4 g/dL (ref 3.5–5.2)
Alkaline Phosphatase: 63 U/L (ref 39–117)
BUN: 17 mg/dL (ref 6–23)
CO2: 31 meq/L (ref 19–32)
Calcium: 10 mg/dL (ref 8.4–10.5)
Chloride: 99 meq/L (ref 96–112)
Creatinine, Ser: 1.02 mg/dL (ref 0.40–1.20)
GFR: 56.61 mL/min — ABNORMAL LOW (ref >60.00)
Glucose, Bld: 96 mg/dL (ref 70–99)
Potassium: 3.9 meq/L (ref 3.5–5.1)
Sodium: 140 meq/L (ref 135–145)
Total Bilirubin: 0.5 mg/dL (ref 0.2–1.2)
Total Protein: 7.1 g/dL (ref 6.0–8.3)

## 2023-12-10 LAB — CBC WITH DIFFERENTIAL/PLATELET
Basophils Absolute: 0.1 K/uL (ref 0.0–0.1)
Basophils Relative: 1.3 % (ref 0.0–3.0)
Eosinophils Absolute: 0.2 K/uL (ref 0.0–0.7)
Eosinophils Relative: 2 % (ref 0.0–5.0)
HCT: 45.4 % (ref 36.0–46.0)
Hemoglobin: 15.2 g/dL — ABNORMAL HIGH (ref 12.0–15.0)
Lymphocytes Relative: 20.9 % (ref 12.0–46.0)
Lymphs Abs: 2 K/uL (ref 0.7–4.0)
MCHC: 33.5 g/dL (ref 30.0–36.0)
MCV: 93.6 fl (ref 78.0–100.0)
Monocytes Absolute: 0.8 K/uL (ref 0.1–1.0)
Monocytes Relative: 8.2 % (ref 3.0–12.0)
Neutro Abs: 6.5 K/uL (ref 1.4–7.7)
Neutrophils Relative %: 67.6 % (ref 43.0–77.0)
Platelets: 266 K/uL (ref 150.0–400.0)
RBC: 4.85 Mil/uL (ref 3.87–5.11)
RDW: 12.9 % (ref 11.5–15.5)
WBC: 9.7 K/uL (ref 4.0–10.5)

## 2023-12-10 LAB — MICROALBUMIN / CREATININE URINE RATIO
Creatinine,U: 110 mg/dL
Microalb Creat Ratio: 10.3 mg/g (ref 0.0–30.0)
Microalb, Ur: 1.1 mg/dL (ref 0.0–1.9)

## 2023-12-10 LAB — LIPID PANEL
Cholesterol: 139 mg/dL (ref 0–200)
HDL: 36 mg/dL — ABNORMAL LOW (ref >39.00)
LDL Cholesterol: 70 mg/dL (ref 0–99)
NonHDL: 102.95
Total CHOL/HDL Ratio: 4
Triglycerides: 163 mg/dL — ABNORMAL HIGH (ref 0.0–149.0)
VLDL: 32.6 mg/dL (ref 0.0–40.0)

## 2023-12-10 LAB — HEMOGLOBIN A1C: Hgb A1c MFr Bld: 6.2 % (ref 4.6–6.5)

## 2023-12-23 ENCOUNTER — Encounter: Payer: Self-pay | Admitting: Nurse Practitioner

## 2023-12-23 ENCOUNTER — Ambulatory Visit: Payer: Self-pay

## 2023-12-23 ENCOUNTER — Telehealth: Payer: Self-pay

## 2023-12-23 ENCOUNTER — Ambulatory Visit: Admitting: Nurse Practitioner

## 2023-12-23 VITALS — BP 116/80 | HR 72 | Temp 98.0°F | Ht 68.0 in | Wt 192.4 lb

## 2023-12-23 DIAGNOSIS — F17210 Nicotine dependence, cigarettes, uncomplicated: Secondary | ICD-10-CM | POA: Diagnosis not present

## 2023-12-23 DIAGNOSIS — Z79899 Other long term (current) drug therapy: Secondary | ICD-10-CM | POA: Diagnosis not present

## 2023-12-23 DIAGNOSIS — M5416 Radiculopathy, lumbar region: Secondary | ICD-10-CM | POA: Diagnosis not present

## 2023-12-23 DIAGNOSIS — R9431 Abnormal electrocardiogram [ECG] [EKG]: Secondary | ICD-10-CM

## 2023-12-23 DIAGNOSIS — E1169 Type 2 diabetes mellitus with other specified complication: Secondary | ICD-10-CM | POA: Diagnosis not present

## 2023-12-23 DIAGNOSIS — R0602 Shortness of breath: Secondary | ICD-10-CM | POA: Diagnosis not present

## 2023-12-23 NOTE — Telephone Encounter (Signed)
 Called [facility] on [address]. To request the EKG they did on the Patient this AM.

## 2023-12-23 NOTE — Telephone Encounter (Signed)
 Please call [facility] pain management to fax the EKG done this morning.

## 2023-12-23 NOTE — Progress Notes (Signed)
 Established Patient Office Visit  Subjective:  Patient ID: Helen Benson, female    DOB: [birth year]  Age: 68 y.o. MRN: [id number]  CC:  Chief Complaint  Patient presents with   Acute Visit    Needs cardiology referral per pain management    Discussed the use of a AI scribe software for clinical note transcription with the patient, who gave verbal consent to proceed.  HPI  Helen Buren Whickeraccompanied with her daughter due to abnormal EKG at [facility].  She denies any chest pain or shortness of breath.  Patient states that she went to the pain clinic for regular follow-up.    Eco 02/2022: Echocardiogram revealed LVEF of 70-75%, no regional wall motion abnormality. There is mitral valve severe annular calcification without evidence of regurgitation.  Stress test 03/26/2022: Normal pharmacologic myocardial perfusion stress test without evidence of significant ischemia or scar. Normal left ventricular systolic function.  HPI   Past Medical History:  Diagnosis Date   Erythrocytosis    Hyperlipidemia    Hypertension    Kidney damage    Type 2 diabetes mellitus (HCC)     Past Surgical History:  Procedure Laterality Date   BREAST CYST ASPIRATION Bilateral    Bilat. many on both per pt   BREAST CYST EXCISION Left    age 54   JOINT REPLACEMENT      Family History  Problem Relation Age of Onset   Breast cancer Sister 78   Breast cancer Other 37       neice    Social History   Socioeconomic History   Marital status: Single    Spouse name: Not on file   Number of children: Not on file   Years of education: Not on file   Highest education level: 9th grade  Occupational History   Not on file  Tobacco Use   Smoking status: Some Days    Current packs/day: 0.00    Average packs/day: 1.5 packs/day for 35.0 years (52.5 ttl pk-yrs)    Types: Cigarettes    Start date: 02/27/1987    Last attempt to quit: 02/26/2022    Years since quitting: 1.8   Smokeless  tobacco: Never   Tobacco comments:    2-3 cigarette a day  Substance and Sexual Activity   Alcohol  use: No   Drug use: Never   Sexual activity: Yes  Other Topics Concern   Not on file  Social History Narrative   Not on file   Social Drivers of Health   Financial Resource Strain: Low Risk  (12/06/2023)   Overall Financial Resource Strain (CARDIA)    Difficulty of Paying Living Expenses: Not hard at all  Food Insecurity: Food Insecurity Present (12/06/2023)   Hunger Vital Sign    Worried About Running Out of Food in the Last Year: Sometimes true    Ran Out of Food in the Last Year: Never true  Transportation Needs: Unmet Transportation Needs (12/06/2023)   PRAPARE - Administrator, Civil Service (Medical): Yes    Lack of Transportation (Non-Medical): No  Physical Activity: Sufficiently Active (12/06/2023)   Exercise Vital Sign    Days of Exercise per Week: 7 days    Minutes of Exercise per Session: 30 min  Stress: No Stress Concern Present (12/06/2023)   [employer] of Occupational Health - Occupational Stress Questionnaire    Feeling of Stress: Only a little  Social Connections: Socially Isolated (12/06/2023)   Social Connection and Isolation  Panel    Frequency of Communication with Friends and Family: More than three times a week    Frequency of Social Gatherings with Friends and Family: Twice a week    Attends Religious Services: Never    Database administrator or Organizations: No    Attends Engineer, structural: Not on file    Marital Status: Divorced  Intimate Partner Violence: Not At Risk (06/18/2023)   Humiliation, Afraid, Rape, and Kick questionnaire    Fear of Current or Ex-Partner: No    Emotionally Abused: No    Physically Abused: No    Sexually Abused: No     Outpatient Medications Prior to Visit  Medication Sig Dispense Refill   ALPRAZolam  (XANAX ) 0.5 MG tablet TAKE 1 TABLET BY MOUTH TWICE DAILY AS NEEDED FOR ANXIETY 60 tablet 0    amLODipine  (NORVASC ) 5 MG tablet Take 1 tablet (5 mg total) by mouth daily. 90 tablet 1   atenolol  (TENORMIN ) 25 MG tablet Take 1 tablet (25 mg total) by mouth daily. 90 tablet 1   chlorthalidone  (HYGROTON ) 25 MG tablet Take 1 tablet (25 mg total) by mouth daily. 90 tablet 1   Cholecalciferol (VITAMIN D -3 PO) Take 1 capsule by mouth daily at 2 PM.     FARXIGA  10 MG TABS tablet Take 1 tablet (10 mg total) by mouth daily. 90 tablet 3   levothyroxine  (SYNTHROID ) 75 MCG tablet Take 1 tablet (75 mcg total) by mouth daily before breakfast. 90 tablet 2   Loratadine (ALLERGY RELIEF 24-HR PO) Take 1 tablet by mouth daily at 2 PM.     losartan  (COZAAR ) 100 MG tablet Take 1 tablet (100 mg total) by mouth daily. 90 tablet 3   Misc. Devices (FINGERTIP PULSE OXIMETER) MISC 1 each by Does not apply route as directed. 1 each 0   naloxone (NARCAN) nasal spray 4 mg/0.1 mL      nicotine  (NICODERM CQ  - DOSED IN MG/24 HOURS) 14 mg/24hr patch Place 1 patch (14 mg total) onto the skin daily. 90 patch 1   oxyCODONE  (ROXICODONE ) 15 MG immediate release tablet Take 15 mg by mouth.     OZEMPIC , 0.25 OR 0.5 MG/DOSE, 2 MG/3ML SOPN INJECT 0.5MG  SUBCUTANEOUSLY ONCE A WEEK 3 mL 2   rosuvastatin  (CRESTOR ) 20 MG tablet Take 0.5 tablets (10 mg total) by mouth daily.     Semaglutide , 1 MG/DOSE, 4 MG/3ML SOPN Inject 1 mg as directed once a week. 3 mL 4   valACYclovir  (VALTREX ) 500 MG tablet Take 1 tablet (500 mg total) by mouth 3 (three) times daily. 21 tablet 0   betamethasone  dipropionate 0.05 % cream Apply topically 2 (two) times daily. 30 g 0   No facility-administered medications prior to visit.    Allergies  Allergen Reactions   Gabapentin Other (See Comments)    Had psychotic episodes      ROS Review of Systems Negative unless indicated in HPI.    Objective:    Physical Exam Constitutional:      Appearance: Normal appearance.  HENT:     Mouth/Throat:     Mouth: Mucous membranes are moist.  Eyes:      Conjunctiva/sclera: Conjunctivae normal.     Pupils: Pupils are equal, round, and reactive to light.  Cardiovascular:     Rate and Rhythm: Normal rate and regular rhythm.     Pulses: Normal pulses.     Heart sounds: Normal heart sounds.  Pulmonary:     Effort: Pulmonary effort  is normal.     Breath sounds: Normal breath sounds.  Abdominal:     Palpations: Abdomen is soft.  Musculoskeletal:     Cervical back: Normal range of motion. No tenderness.  Skin:    General: Skin is warm.     Findings: No bruising.  Neurological:     General: No focal deficit present.     Mental Status: She is alert and oriented to person, place, and time. Mental status is at baseline.  Psychiatric:        Mood and Affect: Mood normal.        Behavior: Behavior normal.        Thought Content: Thought content normal.        Judgment: Judgment normal.     BP 116/80   Pulse 72   Temp 98 F (36.7 C)   Ht 5' 8 (1.727 m)   Wt 192 lb 6.4 oz (87.3 kg)   SpO2 98%   BMI 29.25 kg/m  Wt Readings from Last 3 Encounters:  12/23/23 192 lb 6.4 oz (87.3 kg)  12/09/23 192 lb 3.2 oz (87.2 kg)  08/13/23 186 lb 12.8 oz (84.7 kg)     Health Maintenance  Topic Date Due   Zoster Vaccines- Shingrix (1 of 2) Never done   Colonoscopy  Never done   Hepatitis B Vaccines (3 of 3 - Risk 3-dose series) 06/18/2017   DTaP/Tdap/Td (2 - Td or Tdap) 11/25/2020   Lung Cancer Screening  02/27/2023   FOOT EXAM  04/06/2023   OPHTHALMOLOGY EXAM  10/18/2023   INFLUENZA VACCINE  12/26/2023   Pneumococcal Vaccine: 50+ Years (1 of 2 - PCV) 05/22/2024 (Originally 08/20/1974)   HEMOGLOBIN A1C  06/10/2024   Medicare Annual Wellness (AWV)  06/17/2024   Diabetic kidney evaluation - eGFR measurement  12/08/2024   Diabetic kidney evaluation - Urine ACR  12/08/2024   MAMMOGRAM  10/20/2025   DEXA SCAN  Completed   Hepatitis C Screening  Completed   HPV VACCINES  Aged Out   Meningococcal B Vaccine  Aged Out   COVID-19 Vaccine   Discontinued       Topic Date Due   Hepatitis B Vaccines (3 of 3 - Risk 3-dose series) 06/18/2017    Lab Results  Component Value Date   TSH 1.71 05/23/2023   Lab Results  Component Value Date   WBC 9.7 12/09/2023   HGB 15.2 (H) 12/09/2023   HCT 45.4 12/09/2023   MCV 93.6 12/09/2023   PLT 266.0 12/09/2023   Lab Results  Component Value Date   NA 140 12/09/2023   K 3.9 12/09/2023   CO2 31 12/09/2023   GLUCOSE 96 12/09/2023   BUN 17 12/09/2023   CREATININE 1.02 12/09/2023   BILITOT 0.5 12/09/2023   ALKPHOS 63 12/09/2023   AST 15 12/09/2023   ALT 17 12/09/2023   PROT 7.1 12/09/2023   ALBUMIN 4.4 12/09/2023   CALCIUM  10.0 12/09/2023   ANIONGAP 7 03/05/2022   EGFR 59 (L) 01/03/2022   GFR 56.61 (L) 12/09/2023   Lab Results  Component Value Date   CHOL 139 12/09/2023   Lab Results  Component Value Date   HDL 36.00 (L) 12/09/2023   Lab Results  Component Value Date   LDLCALC 70 12/09/2023   Lab Results  Component Value Date   TRIG 163.0 (H) 12/09/2023   Lab Results  Component Value Date   CHOLHDL 4 12/09/2023   Lab Results  Component Value Date  HGBA1C 6.2 12/09/2023      Assessment & Plan:  Abnormal EKG Assessment & Plan: Sinus rhythm with no acute ischemic changes.  Given risk factors, history of hypertension, diabetes and hyperlipidemia needs further cardiac workup and evaluation.  Will get EKG from the pain management. Patient is agreeable for the referral.   Orders: -     Ambulatory referral to Cardiology -     EKG 12-Lead    Follow-up: No follow-ups on file.   Tanina Barb, NP

## 2023-12-23 NOTE — Telephone Encounter (Signed)
 FYI Only or Action Required?: Action required by provider: referral request and lab or test result follow-up needed.  Patient was last seen in primary care on 12/09/2023 by Vincente Saber, NP.  Called Nurse Triage reporting No chief complaint on file..  Symptoms began today.  Interventions attempted: Nothing.  Symptoms are: stable.  Triage Disposition: Call PCP Now  Patient/caregiver understands and will follow disposition?: Yes     Copied from CRM #[id number]. Topic: Clinical - Red Word Triage >> Dec 23, 2023 10:54 AM Deleta RAMAN wrote: Red Word that prompted transfer to Nurse Triage: patient states she went to doctor visit on today an EKG was done showing she has had a heart attack patient is not sure when Reason for Disposition  [1] Follow-up call from patient regarding patient's clinical status AND [2] information urgent  Answer Assessment - Initial Assessment Questions 1. REASON FOR CALL or QUESTION: What is your reason for calling today? or How can I best      I went to see my Pain Management Doctor this morning, and he did an EKG and told me I had had a Heart Attack, I don't know when this happened.  2. CALLER: Document the source of call. (e.g., laboratory staff, caregiver or patient).     CHARM Drape (Patient)  Protocols used: PCP Call - No Triage-A-AH

## 2023-12-23 NOTE — Telephone Encounter (Signed)
 Helen Benson, triage nurse, called to state patient was seen in pain management today, and when they did her EKG, they discovered she had had a heart attack at some point.  Patient was told she needs a referral to see a cardiologist.  Patient has been scheduled for an appointment to see Chelsea Aurora, NP, today.

## 2023-12-24 DIAGNOSIS — Z716 Tobacco abuse counseling: Secondary | ICD-10-CM | POA: Insufficient documentation

## 2023-12-24 DIAGNOSIS — R911 Solitary pulmonary nodule: Secondary | ICD-10-CM | POA: Insufficient documentation

## 2023-12-24 NOTE — Assessment & Plan Note (Signed)
 Multiple nodules in bilateral lungs, largest 6 mm nodule in left lung. - Will refer to pulmonology to monitor lung nodules.

## 2023-12-24 NOTE — Assessment & Plan Note (Signed)
 Blood pressure is elevated today.  Patient said that she has not taken her medication. -Continue amlodipine , atenolol , chlorthalidone  and losartan . -Will continue to monitor

## 2023-12-24 NOTE — Assessment & Plan Note (Signed)
 Smokes 2-3 cigarettes/day, interested in nicotine  patches for cessation. - Prescribe nicotine  patches.

## 2023-12-24 NOTE — Assessment & Plan Note (Signed)
Continue Crestor. Check lipid panel.

## 2023-12-24 NOTE — Assessment & Plan Note (Signed)
 Last A1c 7.3 on 10 /4/24.  Compliant with medication. -Will check hemoglobin A1c. - Continue Ozempic  0.5 mg and Farxiga  10 mg daily.

## 2023-12-25 DIAGNOSIS — Z79899 Other long term (current) drug therapy: Secondary | ICD-10-CM | POA: Diagnosis not present

## 2023-12-31 ENCOUNTER — Ambulatory Visit: Admitting: Student in an Organized Health Care Education/Training Program

## 2024-01-04 DIAGNOSIS — R9431 Abnormal electrocardiogram [ECG] [EKG]: Secondary | ICD-10-CM | POA: Insufficient documentation

## 2024-01-04 NOTE — Assessment & Plan Note (Addendum)
 Sinus rhythm with no acute ischemic changes.  Given risk factors, history of hypertension, diabetes and hyperlipidemia needs further cardiac workup and evaluation.  Will get EKG from the pain management. Patient is agreeable for the referral.

## 2024-01-07 ENCOUNTER — Telehealth: Payer: Self-pay | Admitting: Nurse Practitioner

## 2024-01-08 NOTE — Telephone Encounter (Signed)
 Called pt and she has an appointment with cardiologist 9/12.

## 2024-01-20 ENCOUNTER — Ambulatory Visit: Admitting: Pulmonary Disease

## 2024-01-23 DIAGNOSIS — F17210 Nicotine dependence, cigarettes, uncomplicated: Secondary | ICD-10-CM | POA: Diagnosis not present

## 2024-01-23 DIAGNOSIS — I10 Essential (primary) hypertension: Secondary | ICD-10-CM | POA: Diagnosis not present

## 2024-01-23 DIAGNOSIS — R030 Elevated blood-pressure reading, without diagnosis of hypertension: Secondary | ICD-10-CM | POA: Diagnosis not present

## 2024-01-23 DIAGNOSIS — E119 Type 2 diabetes mellitus without complications: Secondary | ICD-10-CM | POA: Diagnosis not present

## 2024-01-23 DIAGNOSIS — Z780 Asymptomatic menopausal state: Secondary | ICD-10-CM | POA: Diagnosis not present

## 2024-01-23 DIAGNOSIS — M5416 Radiculopathy, lumbar region: Secondary | ICD-10-CM | POA: Diagnosis not present

## 2024-01-23 DIAGNOSIS — Z87891 Personal history of nicotine dependence: Secondary | ICD-10-CM | POA: Diagnosis not present

## 2024-01-23 DIAGNOSIS — Z79899 Other long term (current) drug therapy: Secondary | ICD-10-CM | POA: Diagnosis not present

## 2024-01-23 DIAGNOSIS — E1169 Type 2 diabetes mellitus with other specified complication: Secondary | ICD-10-CM | POA: Diagnosis not present

## 2024-01-27 ENCOUNTER — Ambulatory Visit: Admitting: Pulmonary Disease

## 2024-01-29 DIAGNOSIS — Z79899 Other long term (current) drug therapy: Secondary | ICD-10-CM | POA: Diagnosis not present

## 2024-02-04 NOTE — Progress Notes (Deleted)
  Cardiology Office Note   Date:  02/04/2024  ID:  Helen Benson, DOB 07/07/1955, MRN [id number] PCP: Vincente Saber, NP  [facility] Providers Cardiologist:  None { Click to update primary MD,subspecialty MD or APP then REFRESH:1}    History of Present Illness Helen Benson is a 68 y.o. female PMH HTN, HLD, obesity, tobacco use who presents for further evaluation management of abnormal ECG.  ***. Last LDL 122 02/05/24.  Relevant CVD History -Normal SPECT MPI 03/2022; on the CT attenuation scan there was noted to be CAC, aortic atherosclerosis, and MAC - TTE 02/2022 LVEF 70 to 75%, grade 2 diastolic dysfunction, severe MAC with mean gradient 5 mmHg and MVA 2.1, likely mild to moderate stenosis   ROS: Pt denies any chest discomfort, jaw pain, arm pain, palpitations, syncope, presyncope, orthopnea, PND, or LE edema.  Studies Reviewed I have independently reviewed the patient's ECG, ***.  Physical Exam VS:  There were no vitals taken for this visit.       Wt Readings from Last 3 Encounters:  12/23/23 192 lb 6.4 oz (87.3 kg)  12/09/23 192 lb 3.2 oz (87.2 kg)  08/13/23 186 lb 12.8 oz (84.7 kg)    GEN: No acute distress. NECK: No JVD; No carotid bruits. CARDIAC: ***RRR, no murmurs, rubs, gallops. RESPIRATORY:  Clear to auscultation. EXTREMITIES:  Warm and well-perfused. No edema.  ASSESSMENT AND PLAN Abnormal ECG Severe MAC Moderate calcific MS CAC Aortic atherosclerosis HTN HLD        {Are you ordering a CV Procedure (e.g. stress test, cath, DCCV, TEE, etc)?   Press F2        :[id number]}  Dispo: ***  Signed, Caron Poser, MD

## 2024-02-05 DIAGNOSIS — I10 Essential (primary) hypertension: Secondary | ICD-10-CM | POA: Diagnosis not present

## 2024-02-05 DIAGNOSIS — E119 Type 2 diabetes mellitus without complications: Secondary | ICD-10-CM | POA: Diagnosis not present

## 2024-02-05 DIAGNOSIS — N182 Chronic kidney disease, stage 2 (mild): Secondary | ICD-10-CM | POA: Diagnosis not present

## 2024-02-06 ENCOUNTER — Ambulatory Visit

## 2024-02-23 ENCOUNTER — Ambulatory Visit

## 2024-02-23 DIAGNOSIS — I10 Essential (primary) hypertension: Secondary | ICD-10-CM | POA: Diagnosis not present

## 2024-02-23 DIAGNOSIS — E1169 Type 2 diabetes mellitus with other specified complication: Secondary | ICD-10-CM | POA: Diagnosis not present

## 2024-02-23 DIAGNOSIS — F17210 Nicotine dependence, cigarettes, uncomplicated: Secondary | ICD-10-CM | POA: Diagnosis not present

## 2024-02-23 DIAGNOSIS — E119 Type 2 diabetes mellitus without complications: Secondary | ICD-10-CM | POA: Diagnosis not present

## 2024-02-23 DIAGNOSIS — R030 Elevated blood-pressure reading, without diagnosis of hypertension: Secondary | ICD-10-CM | POA: Diagnosis not present

## 2024-02-23 NOTE — Progress Notes (Unsigned)
  Cardiology Office Note   Date:  02/23/2024  ID:  Helen Benson, DOB Jul 04, 1955, MRN [id number] PCP: Vincente Saber, NP  [facility] Providers Cardiologist:  None { Click to update primary MD,subspecialty MD or APP then REFRESH:1}    History of Present Illness Helen Benson is a 68 y.o. female PMH HTN, HLD, obesity, tobacco use who presents for further evaluation management of abnormal ECG.  ***. Last LDL 122 02/05/24.  Relevant CVD History -Normal SPECT MPI 03/2022; on the CT attenuation scan there was noted to be CAC, aortic atherosclerosis, and MAC - TTE 02/2022 LVEF 70 to 75%, grade 2 diastolic dysfunction, severe MAC with mean gradient 5 mmHg and MVA 2.1, likely mild to moderate stenosis   ROS: Pt denies any chest discomfort, jaw pain, arm pain, palpitations, syncope, presyncope, orthopnea, PND, or LE edema.  Studies Reviewed I have independently reviewed the patient's ECG, ***.  Physical Exam VS:  There were no vitals taken for this visit.       Wt Readings from Last 3 Encounters:  12/23/23 192 lb 6.4 oz (87.3 kg)  12/09/23 192 lb 3.2 oz (87.2 kg)  08/13/23 186 lb 12.8 oz (84.7 kg)    GEN: No acute distress. NECK: No JVD; No carotid bruits. CARDIAC: ***RRR, no murmurs, rubs, gallops. RESPIRATORY:  Clear to auscultation. EXTREMITIES:  Warm and well-perfused. No edema.  ASSESSMENT AND PLAN Abnormal ECG Severe MAC Moderate calcific MS CAC Aortic atherosclerosis HTN HLD        {Are you ordering a CV Procedure (e.g. stress test, cath, DCCV, TEE, etc)?   Press F2        :[id number]}  Dispo: ***  Signed, Caron Poser, MD

## 2024-02-24 ENCOUNTER — Ambulatory Visit

## 2024-03-08 ENCOUNTER — Telehealth: Payer: Self-pay | Admitting: Nurse Practitioner

## 2024-03-08 NOTE — Telephone Encounter (Signed)
 Copied from CRM [id number]. Topic: Appointments - Scheduling Inquiry for Clinic >> Mar 08, 2024  1:55 PM Mesmerise C wrote: Reason for CRM: Patient would like to schedule a bone density test patient would like a call back to schedule

## 2024-03-09 ENCOUNTER — Other Ambulatory Visit: Payer: Self-pay

## 2024-03-09 DIAGNOSIS — M5416 Radiculopathy, lumbar region: Secondary | ICD-10-CM

## 2024-03-09 DIAGNOSIS — M25521 Pain in right elbow: Secondary | ICD-10-CM

## 2024-03-09 DIAGNOSIS — M545 Low back pain, unspecified: Secondary | ICD-10-CM

## 2024-03-09 NOTE — Telephone Encounter (Signed)
 Patient had a Dexa scan on 05/27/20. Can we order a bone density test or does she need an appointment with you first?

## 2024-03-09 NOTE — Telephone Encounter (Signed)
 Please order bone density for her.

## 2024-03-10 ENCOUNTER — Other Ambulatory Visit: Payer: Self-pay

## 2024-03-10 DIAGNOSIS — M5416 Radiculopathy, lumbar region: Secondary | ICD-10-CM

## 2024-03-10 DIAGNOSIS — M25521 Pain in right elbow: Secondary | ICD-10-CM

## 2024-03-10 DIAGNOSIS — M545 Low back pain, unspecified: Secondary | ICD-10-CM

## 2024-03-10 NOTE — Telephone Encounter (Signed)
 Called Patient she is aware of the bone density scan that Charanpreet Kaur ordered and she has the phone number to call and schedule.

## 2024-03-10 NOTE — Telephone Encounter (Unsigned)
 Copied from CRM #[id number]. Topic: Appointments - Scheduling Inquiry for Clinic >> Mar 10, 2024 10:18 AM Precious C wrote: Reason for CRM: Patient called back requesting to schedule a bone density test patient would like a call back to schedule or to have order put in. Also her pain management doctor said she couldn't come back until she has had a bone density test.  Callback: [phone]

## 2024-03-16 NOTE — Progress Notes (Unsigned)
  Cardiology Office Note   Date:  03/18/2024  ID:  MARLIN BRYS, DOB 02/17/1956, MRN [id number] PCP: Vincente Saber, NP  [facility] Providers Cardiologist:  Caron Poser, MD     History of Present Illness Helen Benson is a 68 y.o. female PMH HTN, HLD, obesity, tobacco use who presents for further evaluation management of abnormal ECG.  Patient reports she is overall feeling well.  She denies any chest discomfort or shortness of breath.  She denies any orthopnea or LE edema.  Last LDL 122 02/05/24.  Relevant CVD History -Normal SPECT MPI 03/2022; on the CT attenuation scan there was noted to be CAC, aortic atherosclerosis, and MAC - TTE 02/2022 LVEF 70 to 75%, grade 2 diastolic dysfunction, severe MAC with mean gradient 5 mmHg and MVA 2.1, likely mild to moderate stenosis   ROS: Pt denies any chest discomfort, jaw pain, arm pain, palpitations, syncope, presyncope, orthopnea, PND, or LE edema.  Studies Reviewed I have independently reviewed the patient's ECG, previous cardiac testing, previous medical records.  Physical Exam VS:  There were no vitals taken for this visit.       Wt Readings from Last 3 Encounters:  12/23/23 192 lb 6.4 oz (87.3 kg)  12/09/23 192 lb 3.2 oz (87.2 kg)  08/13/23 186 lb 12.8 oz (84.7 kg)    GEN: No acute distress. NECK: No JVD; No carotid bruits. CARDIAC: RRR, no murmurs, rubs, gallops. RESPIRATORY:  Clear to auscultation. EXTREMITIES:  Warm and well-perfused. No edema.  ASSESSMENT AND PLAN  Severe MAC Moderate calcific MS Echocardiogram 02/2022 showed preserved LV function with severe MAC and mild to moderate calcific MS.  She is asymptomatic for the time being.  No signs or symptoms of heart failure.  Plan: - Will plan for repeat echo to monitor severity of mitral stenosis; will plan to uptitrate beta-blocker as needed  Abnormal ECG Patient presents for abnormal ECG.  ECG shows septal Q waves which have been present for  quite some time.  She had a SPECT 03/2022 which showed normal perfusion (Q waves present at that time).  No evidence of infarct or ischemia on that exam.  She also has no chest discomfort or dyspnea.  Echocardiogram did not show anterior wall motion abnormality.  Since she is asymptomatic, no further coronary testing indicated.  CAC Aortic atherosclerosis HLD Seen on prior CT attenuation scan.  Denies any chest discomfort or other symptoms of angina.  Last LDL 122 01/2024.  LDL goal less than 70.  Plan: - Continue ASA 81 mg daily - Increase Crestor  to 20 mg daily; she has been nonadherent to the 10 mg dose.  I encouraged her to continue taking.  Lipid panel can be rechecked in the next 3 to 6 months. - She may ultimately need Repatha, but we will see how far we get with this dose increase - As above, no further coronary testing indicated in the absence of symptoms.  If she does develop symptoms, I would recommend a stress PET to further stratify since a coronary CTA would be difficult to interpret given her significant calcium  burden.        Dispo: RTC 1 year or sooner as needed  Signed, Caron Poser, MD

## 2024-03-18 ENCOUNTER — Ambulatory Visit

## 2024-03-18 VITALS — BP 110/64 | HR 70 | Ht 63.0 in | Wt 192.0 lb

## 2024-03-18 DIAGNOSIS — I700 Atherosclerosis of aorta: Secondary | ICD-10-CM

## 2024-03-18 DIAGNOSIS — E782 Mixed hyperlipidemia: Secondary | ICD-10-CM

## 2024-03-18 DIAGNOSIS — I2510 Atherosclerotic heart disease of native coronary artery without angina pectoris: Secondary | ICD-10-CM | POA: Diagnosis not present

## 2024-03-18 DIAGNOSIS — I342 Nonrheumatic mitral (valve) stenosis: Secondary | ICD-10-CM

## 2024-03-18 DIAGNOSIS — R9431 Abnormal electrocardiogram [ECG] [EKG]: Secondary | ICD-10-CM | POA: Diagnosis not present

## 2024-03-18 DIAGNOSIS — E785 Hyperlipidemia, unspecified: Secondary | ICD-10-CM | POA: Diagnosis not present

## 2024-03-18 DIAGNOSIS — I3481 Nonrheumatic mitral (valve) annulus calcification: Secondary | ICD-10-CM

## 2024-03-18 MED ORDER — ASPIRIN 81 MG PO TBEC: 81.0000 mg | DELAYED_RELEASE_TABLET | Freq: Every day | ORAL | Status: AC

## 2024-03-18 MED ORDER — ROSUVASTATIN CALCIUM 20 MG PO TABS: 20.0000 mg | ORAL_TABLET | Freq: Every day | ORAL | 3 refills | Status: AC

## 2024-03-18 NOTE — Patient Instructions (Signed)
 Medication Instructions:  Your physician recommends the following medication changes.  START TAKING: Aspirin  81 mg by mouth once daily  Rosuvastatin  (CRESTOR ) 20 mg by mouth once daily   *If you need a refill on your cardiac medications before your next appointment, please call your pharmacy*  Lab Work:  No labs ordered today  If you have labs (blood work) drawn today and your tests are completely normal, you will receive your results only by: MyChart Message (if you have MyChart) OR A paper copy in the mail If you have any lab test that is abnormal or we need to change your treatment, we will call you to review the results.  Testing/Procedures:  Your physician has requested that you have an echocardiogram. Echocardiography is a painless test that uses sound waves to create images of your heart. It provides your doctor with information about the size and shape of your heart and how well your heart's chambers and valves are working.   You may receive an ultrasound enhancing agent through an IV if needed to better visualize your heart during the echo. This procedure takes approximately one hour.  There are no restrictions for this procedure.  This will take place at [address] [facility], [place] [address]  Please note: We ask at that you not bring children with you during ultrasound (echo/ vascular) testing. Due to room size and safety concerns, children are not allowed in the ultrasound rooms during exams. Our front office staff cannot provide observation of children in our lobby area while testing is being conducted. An adult accompanying a patient to their appointment will only be allowed in the ultrasound room at the discretion of the ultrasound technician under special circumstances. We apologize for any inconvenience.   Follow-Up: At [facility], you and your health needs are our priority.  As part of our continuing mission to provide you with  exceptional heart care, our providers are all part of one team.  This team includes your primary Cardiologist (physician) and Advanced Practice Providers or APPs (Physician Assistants and Nurse Practitioners) who all work together to provide you with the care you need, when you need it.  Your next appointment:   1 year(s)  Provider:   You may see Caron Poser, MD or one of the following Advanced Practice Providers on your designated Care Team:   Lonni Meager, NP Lesley Maffucci, PA-C Bernardino Bring, PA-C Cadence Tallas[place], PA-C Tylene Lunch, NP Barnie Hila, NP   We recommend signing up for the patient portal called MyChart.  Sign up information is provided on this After Visit Summary.  MyChart is used to connect with patients for Virtual Visits (Telemedicine).  Patients are able to view lab/test results, encounter notes, upcoming appointments, etc.  Non-urgent messages can be sent to your provider as well.   To learn more about what you can do with MyChart, go to [email or web address].

## 2024-03-23 ENCOUNTER — Ambulatory Visit
Admission: RE | Admit: 2024-03-23 | Discharge: 2024-03-23 | Disposition: A | Discharge status: Home / Self Care | Source: Ambulatory Visit | Attending: Nurse Practitioner | Admitting: Nurse Practitioner

## 2024-03-23 DIAGNOSIS — Z780 Asymptomatic menopausal state: Secondary | ICD-10-CM | POA: Insufficient documentation

## 2024-03-23 DIAGNOSIS — M25521 Pain in right elbow: Secondary | ICD-10-CM | POA: Diagnosis not present

## 2024-03-23 DIAGNOSIS — M8588 Other specified disorders of bone density and structure, other site: Secondary | ICD-10-CM | POA: Insufficient documentation

## 2024-03-23 DIAGNOSIS — Z13820 Encounter for screening for osteoporosis: Secondary | ICD-10-CM | POA: Insufficient documentation

## 2024-03-23 DIAGNOSIS — M5416 Radiculopathy, lumbar region: Secondary | ICD-10-CM | POA: Insufficient documentation

## 2024-03-23 DIAGNOSIS — M545 Low back pain, unspecified: Secondary | ICD-10-CM | POA: Diagnosis not present

## 2024-03-24 DIAGNOSIS — Z79899 Other long term (current) drug therapy: Secondary | ICD-10-CM | POA: Diagnosis not present

## 2024-03-24 DIAGNOSIS — G8929 Other chronic pain: Secondary | ICD-10-CM | POA: Diagnosis not present

## 2024-03-24 DIAGNOSIS — M5416 Radiculopathy, lumbar region: Secondary | ICD-10-CM | POA: Diagnosis not present

## 2024-03-24 DIAGNOSIS — Z87891 Personal history of nicotine dependence: Secondary | ICD-10-CM | POA: Diagnosis not present

## 2024-03-24 DIAGNOSIS — M542 Cervicalgia: Secondary | ICD-10-CM | POA: Diagnosis not present

## 2024-03-24 DIAGNOSIS — M549 Dorsalgia, unspecified: Secondary | ICD-10-CM | POA: Diagnosis not present

## 2024-03-24 DIAGNOSIS — I10 Essential (primary) hypertension: Secondary | ICD-10-CM | POA: Diagnosis not present

## 2024-03-24 DIAGNOSIS — F17210 Nicotine dependence, cigarettes, uncomplicated: Secondary | ICD-10-CM | POA: Diagnosis not present

## 2024-03-24 DIAGNOSIS — E1169 Type 2 diabetes mellitus with other specified complication: Secondary | ICD-10-CM | POA: Diagnosis not present

## 2024-03-26 DIAGNOSIS — Z79899 Other long term (current) drug therapy: Secondary | ICD-10-CM | POA: Diagnosis not present

## 2024-04-05 ENCOUNTER — Other Ambulatory Visit: Payer: Self-pay | Admitting: Nurse Practitioner

## 2024-04-11 ENCOUNTER — Ambulatory Visit: Payer: Self-pay | Admitting: Nurse Practitioner

## 2024-04-11 NOTE — Progress Notes (Signed)
 Please schedule patient to discuss bone density result

## 2024-04-29 ENCOUNTER — Ambulatory Visit: Payer: Self-pay

## 2024-04-29 NOTE — Telephone Encounter (Signed)
 Patient is scheduled for 04/30/24 at 3:00.

## 2024-04-29 NOTE — Telephone Encounter (Signed)
 FYI Only or Action Required?: FYI only for provider: appointment scheduled on 12.8.25.  Patient was last seen in primary care on 12/23/2023 by Vincente Saber, NP.  Called Nurse Triage reporting Wound Infection.  Symptoms began about a month ago.  Interventions attempted: OTC medications: neosporin.  Symptoms are: unchanged.  Triage Disposition: See PCP When Office is Open (Within 3 Days)  Patient/caregiver understands and will follow disposition?: Yes     Copied from CRM #[id number]. Topic: Clinical - Red Word Triage >> Apr 29, 2024  9:51 AM Harlene ORN wrote: Red Word that prompted transfer to Nurse Triage: bleeding blister on inner upper thigh for a month. Not healing. Reason for Disposition  [1] After 14 days AND [2] wound isn't healed  Answer Assessment - Initial Assessment Questions Blister on thigh where it meets groin x 1 month, bleeding and clear drainage, won't heal. Size of a time. States she does not wear panties at night only during the day. Pt has been putting neosporin on it. Denies, redness, fever, warmth.    1. LOCATION: Where is the wound located?    Left upper thigh by groin 2. WOUND APPEARANCE: What does the wound look like?      Like a blister 3. SIZE: If redness is present, ask: What is the size of the red area? (Inches, centimeters, or compare to size of a coin)      dime 4. SPREAD: What's changed in the last day?  Do you see any red streaks coming from the wound?     nothing 5. ONSET: When did it start to look infected?      Been there one month, no signs of infection, not healing 6. MECHANISM: How did the wound start, what was the cause?     Unknown 7. PAIN: Do you have any pain?  If Yes, ask: How bad is the pain?  (e.g., Scale 1-10; mild, moderate, or severe)     2-3 8. FEVER: Do you have a fever? If Yes, ask: What is your temperature, how was it measured, and when did it start?     denies 9. OTHER SYMPTOMS: Do you have any other  symptoms? (e.g., shaking chills, weakness, rash elsewhere on body)     denies  Protocols used: Wound Infection Suspected-A-AH

## 2024-04-30 ENCOUNTER — Encounter: Payer: Self-pay | Admitting: Nurse Practitioner

## 2024-04-30 ENCOUNTER — Ambulatory Visit: Admitting: Nurse Practitioner

## 2024-04-30 VITALS — BP 112/72 | HR 83 | Temp 97.6°F | Ht 63.0 in | Wt 196.0 lb

## 2024-04-30 DIAGNOSIS — Z23 Encounter for immunization: Secondary | ICD-10-CM

## 2024-04-30 DIAGNOSIS — L02214 Cutaneous abscess of groin: Secondary | ICD-10-CM | POA: Diagnosis not present

## 2024-04-30 MED ORDER — CHLORTHALIDONE 25 MG PO TABS: 25.0000 mg | ORAL_TABLET | Freq: Every day | ORAL | 1 refills | Status: AC

## 2024-04-30 MED ORDER — MUPIROCIN 2 % EX OINT: 1.0000 | TOPICAL_OINTMENT | Freq: Two times a day (BID) | CUTANEOUS | 0 refills | Status: AC

## 2024-04-30 MED ORDER — FLUCONAZOLE 150 MG PO TABS: 150.0000 mg | ORAL_TABLET | Freq: Once | ORAL | 0 refills | Status: AC

## 2024-04-30 MED ORDER — CEPHALEXIN 500 MG PO CAPS: 500.0000 mg | ORAL_CAPSULE | Freq: Two times a day (BID) | ORAL | 0 refills | Status: DC

## 2024-04-30 MED ORDER — ATENOLOL 25 MG PO TABS: 25.0000 mg | ORAL_TABLET | Freq: Every day | ORAL | 1 refills | Status: AC

## 2024-04-30 NOTE — Telephone Encounter (Signed)
 completed

## 2024-04-30 NOTE — Telephone Encounter (Signed)
 Thank you. Pt seen in the office

## 2024-05-03 ENCOUNTER — Ambulatory Visit: Admitting: Family Medicine

## 2024-05-05 DIAGNOSIS — L02214 Cutaneous abscess of groin: Secondary | ICD-10-CM | POA: Insufficient documentation

## 2024-05-05 NOTE — Progress Notes (Signed)
 Established Patient Office Visit  Subjective:  Patient ID: Helen Benson, female    DOB: 05-Feb-1956  Age: 68 y.o. MRN: [id number]  CC:  Chief Complaint  Patient presents with   Acute Visit    Left groin wound may be infected Bleeding and clear drainage on 04/29/24 Using neosporin   Discussed the use of AI scribe software for clinical note transcription with the patient, who gave verbal consent to proceed.  History of Present Illness   Helen Benson is a 68 year old female with diabetes who presents with a non-healing blister on her left groin.  She has had this blister for about a month, likely from friction from underwear. It has not healed and stays red with intermittent minor bleeding and clear drainage. She applies Neosporin intermittently, which seems to help. It is not currently bleeding or painful but is irritating when touched. She denies fever, chills, or pain limiting activity.  She has diabetes treated with Ozempic . Her A1c was 6.2 four months ago. This is her first wound that has not healed promptly.     Past Medical History:  Diagnosis Date   Erythrocytosis    Hyperlipidemia    Hypertension    Kidney damage    Type 2 diabetes mellitus (HCC)     Past Surgical History:  Procedure Laterality Date   BREAST CYST ASPIRATION Bilateral    Bilat. many on both per pt   BREAST CYST EXCISION Left    age 17   JOINT REPLACEMENT      Family History  Problem Relation Age of Onset   Breast cancer Sister 57   Breast cancer Other 106       neice    Social History   Socioeconomic History   Marital status: Single    Spouse name: Not on file   Number of children: Not on file   Years of education: Not on file   Highest education level: 9th grade  Occupational History   Not on file  Tobacco Use   Smoking status: Some Days    Current packs/day: 0.00    Average packs/day: 1.5 packs/day for 35.0 years (52.5 ttl pk-yrs)    Types: Cigarettes    Start date:  02/27/1987    Last attempt to quit: 02/26/2022    Years since quitting: 2.1   Smokeless tobacco: Never   Tobacco comments:    2-3 cigarette a day  Vaping Use   Vaping status: Never Used  Substance and Sexual Activity   Alcohol  use: No   Drug use: Never   Sexual activity: Yes  Other Topics Concern   Not on file  Social History Narrative   Not on file   Social Drivers of Health   Financial Resource Strain: Low Risk  (12/06/2023)   Overall Financial Resource Strain (CARDIA)    Difficulty of Paying Living Expenses: Not hard at all  Food Insecurity: Food Insecurity Present (12/06/2023)   Hunger Vital Sign    Worried About Running Out of Food in the Last Year: Sometimes true    Ran Out of Food in the Last Year: Never true  Transportation Needs: Unmet Transportation Needs (12/06/2023)   PRAPARE - Administrator, Civil Service (Medical): Yes    Lack of Transportation (Non-Medical): No  Physical Activity: Sufficiently Active (12/06/2023)   Exercise Vital Sign    Days of Exercise per Week: 7 days    Minutes of Exercise per Session: 30 min  Stress: No  Stress Concern Present (12/06/2023)   [employer] of Occupational Health - Occupational Stress Questionnaire    Feeling of Stress: Only a little  Social Connections: Socially Isolated (12/06/2023)   Social Connection and Isolation Panel    Frequency of Communication with Friends and Family: More than three times a week    Frequency of Social Gatherings with Friends and Family: Twice a week    Attends Religious Services: Never    Database Administrator or Organizations: No    Attends Engineer, Structural: Not on file    Marital Status: Divorced  Intimate Partner Violence: Not At Risk (06/18/2023)   Humiliation, Afraid, Rape, and Kick questionnaire    Fear of Current or Ex-Partner: No    Emotionally Abused: No    Physically Abused: No    Sexually Abused: No     Outpatient Medications Prior to Visit  Medication  Sig Dispense Refill   ALPRAZolam  (XANAX ) 0.5 MG tablet TAKE 1 TABLET BY MOUTH TWICE DAILY AS NEEDED FOR ANXIETY 60 tablet 0   aspirin  EC 81 MG tablet Take 1 tablet (81 mg total) by mouth daily. Swallow whole.     betamethasone  dipropionate 0.05 % cream Apply topically 2 (two) times daily. 30 g 0   Cholecalciferol (VITAMIN D -3 PO) Take 1 capsule by mouth daily at 2 PM.     FARXIGA  10 MG TABS tablet Take 1 tablet (10 mg total) by mouth daily. 90 tablet 3   Loratadine (ALLERGY RELIEF 24-HR PO) Take 1 tablet by mouth daily at 2 PM.     naloxone (NARCAN) nasal spray 4 mg/0.1 mL      olmesartan (BENICAR) 40 MG tablet Take 40 mg by mouth daily.     oxyCODONE  (ROXICODONE ) 15 MG immediate release tablet Take 15 mg by mouth.     OZEMPIC , 1 MG/DOSE, 4 MG/3ML SOPN INJECT 1MG  SUBCUTANEOUSLY ONCE A WEEK 3 mL 4   rosuvastatin  (CRESTOR ) 20 MG tablet Take 1 tablet (20 mg total) by mouth daily. 90 tablet 3   spironolactone (ALDACTONE) 25 MG tablet Take 25 mg by mouth daily.     atenolol  (TENORMIN ) 25 MG tablet Take 1 tablet (25 mg total) by mouth daily. 90 tablet 1   chlorthalidone  (HYGROTON ) 25 MG tablet Take 1 tablet (25 mg total) by mouth daily. 90 tablet 1   Misc. Devices (FINGERTIP PULSE OXIMETER) MISC 1 each by Does not apply route as directed. (Patient not taking: Reported on 03/18/2024) 1 each 0   valACYclovir  (VALTREX ) 500 MG tablet Take 1 tablet (500 mg total) by mouth 3 (three) times daily. (Patient taking differently: Take 500 mg by mouth as needed.) 21 tablet 0   amLODipine  (NORVASC ) 5 MG tablet Take 1 tablet (5 mg total) by mouth daily. (Patient not taking: Reported on 03/18/2024) 90 tablet 1   levothyroxine  (SYNTHROID ) 75 MCG tablet Take 1 tablet (75 mcg total) by mouth daily before breakfast. 90 tablet 2   losartan  (COZAAR ) 100 MG tablet Take 1 tablet (100 mg total) by mouth daily. (Patient not taking: Reported on 03/18/2024) 90 tablet 3   nicotine  (NICODERM CQ  - DOSED IN MG/24 HOURS) 14 mg/24hr  patch Place 1 patch (14 mg total) onto the skin daily. (Patient not taking: Reported on 03/18/2024) 90 patch 1   OZEMPIC , 0.25 OR 0.5 MG/DOSE, 2 MG/3ML SOPN INJECT 0.5MG  SUBCUTANEOUSLY ONCE A WEEK 3 mL 2   No facility-administered medications prior to visit.    Allergies  Allergen Reactions  Gabapentin Other (See Comments)    Had psychotic episodes      ROS Review of Systems Negative unless indicated in HPI.    Objective:    Physical Exam Constitutional:      Appearance: Normal appearance.  Cardiovascular:     Rate and Rhythm: Normal rate and regular rhythm.     Pulses: Normal pulses.     Heart sounds: Normal heart sounds.  Pulmonary:     Effort: Pulmonary effort is normal.     Breath sounds: Normal breath sounds. No wheezing.  Skin:    Findings: Lesion present.  Neurological:     General: No focal deficit present.     Mental Status: She is alert and oriented to person, place, and time.  Psychiatric:        Mood and Affect: Mood normal.     BP 112/72   Pulse 83   Temp 97.6 F (36.4 C)   Ht 5' 3 (1.6 m)   Wt 196 lb (88.9 kg)   SpO2 97%   BMI 34.72 kg/m  Wt Readings from Last 3 Encounters:  04/30/24 196 lb (88.9 kg)  03/18/24 192 lb (87.1 kg)  12/23/23 192 lb 6.4 oz (87.3 kg)     Health Maintenance  Topic Date Due   Zoster Vaccines- Shingrix (1 of 2) Never done   Colonoscopy  Never done   DTaP/Tdap/Td (2 - Td or Tdap) 11/25/2020   Lung Cancer Screening  02/27/2023   FOOT EXAM  04/06/2023   OPHTHALMOLOGY EXAM  10/18/2023   Medicare Annual Wellness (AWV)  06/17/2024   Pneumococcal Vaccine: 50+ Years (1 of 2 - PCV) 05/22/2024 (Originally 08/20/1974)   HEMOGLOBIN A1C  06/10/2024   Diabetic kidney evaluation - eGFR measurement  12/08/2024   Diabetic kidney evaluation - Urine ACR  12/08/2024   Mammogram  10/20/2025   Influenza Vaccine  Completed   Bone Density Scan  Completed   Hepatitis C Screening  Completed   Meningococcal B Vaccine  Aged Out    Hepatitis B Vaccines 19-59 Average Risk  Discontinued   COVID-19 Vaccine  Discontinued    There are no preventive care reminders to display for this patient.  Lab Results  Component Value Date   TSH 1.71 05/23/2023   Lab Results  Component Value Date   WBC 9.7 12/09/2023   HGB 15.2 (H) 12/09/2023   HCT 45.4 12/09/2023   MCV 93.6 12/09/2023   PLT 266.0 12/09/2023   Lab Results  Component Value Date   NA 140 12/09/2023   K 3.9 12/09/2023   CO2 31 12/09/2023   GLUCOSE 96 12/09/2023   BUN 17 12/09/2023   CREATININE 1.02 12/09/2023   BILITOT 0.5 12/09/2023   ALKPHOS 63 12/09/2023   AST 15 12/09/2023   ALT 17 12/09/2023   PROT 7.1 12/09/2023   ALBUMIN 4.4 12/09/2023   CALCIUM  10.0 12/09/2023   ANIONGAP 7 03/05/2022   EGFR 59 (L) 01/03/2022   GFR 56.61 (L) 12/09/2023   Lab Results  Component Value Date   CHOL 139 12/09/2023   Lab Results  Component Value Date   HDL 36.00 (L) 12/09/2023   Lab Results  Component Value Date   LDLCALC 70 12/09/2023   Lab Results  Component Value Date   TRIG 163.0 (H) 12/09/2023   Lab Results  Component Value Date   CHOLHDL 4 12/09/2023   Lab Results  Component Value Date   HGBA1C 6.2 12/09/2023      Assessment & Plan:  Assessment & Plan Immunization due  Orders:   Flu vaccine trivalent PF, 6mos and older(Flulaval,Afluria,Fluarix,Fluzone)  Abscess of groin, left Small abscess to the left groin with the small central opening and mild surrounding erythema. -Will start on cephalexin  500 mg twice a day for 10 days and Bactroban  ointment -Also start on Diflucan  to prevent yeast infection, patient requested. -Follow-up in a week     Tobacco use     Lung nodule seen on imaging study Patient agreeable for CT for evaluation. -Referral sent to pulmonology Orders:   Ambulatory referral to Pulmonology     Follow-up: Return in about 1 week (around 05/07/2024) for wound check and 2 month regular follow up.    Avon Molock, NP

## 2024-05-05 NOTE — Assessment & Plan Note (Addendum)
 Small abscess to the left groin with the small central opening and mild surrounding erythema. -Will start on cephalexin  500 mg twice a day for 10 days and Bactroban  ointment -Also start on Diflucan  to prevent yeast infection, patient requested. -Follow-up in a week

## 2024-05-05 NOTE — Assessment & Plan Note (Signed)
 Patient agreeable for CT for evaluation. -Referral sent to pulmonology Orders:   Ambulatory referral to Pulmonology

## 2024-05-07 ENCOUNTER — Ambulatory Visit

## 2024-05-12 ENCOUNTER — Ambulatory Visit: Admitting: Pulmonary Disease

## 2024-05-28 ENCOUNTER — Ambulatory Visit: Payer: Self-pay

## 2024-05-28 ENCOUNTER — Ambulatory Visit
Admission: EM | Admit: 2024-05-28 | Discharge: 2024-05-28 | Disposition: A | Discharge status: Home / Self Care | Attending: Family Medicine | Admitting: Family Medicine

## 2024-05-28 ENCOUNTER — Encounter: Payer: Self-pay | Admitting: Emergency Medicine

## 2024-05-28 DIAGNOSIS — J069 Acute upper respiratory infection, unspecified: Secondary | ICD-10-CM | POA: Diagnosis not present

## 2024-05-28 LAB — POCT INFLUENZA A/B
Influenza A, POC: NEGATIVE
Influenza B, POC: NEGATIVE

## 2024-05-28 LAB — POC SOFIA SARS ANTIGEN FIA: SARS Coronavirus 2 Ag: NEGATIVE

## 2024-05-28 LAB — POCT RAPID STREP A (OFFICE): Rapid Strep A Screen: NEGATIVE

## 2024-05-28 MED ORDER — PROMETHAZINE-DM 6.25-15 MG/5ML PO SYRP: 5.0000 mL | ORAL_SOLUTION | Freq: Three times a day (TID) | ORAL | 0 refills | Status: AC | PRN

## 2024-05-28 NOTE — Discharge Instructions (Addendum)
 You tested negative for COVID, flu, and strep throat.  You may start Promethazine DM as needed for your cough.  Please of this medication make you drowsy.  Do not drink alcohol  or drive while on this medication. Lots of rest and fluids.  Please follow-up with your PCP in 2 to 3 days for recheck.  Please go to the emergency room for any worsening symptoms.  Hope you feel better soon

## 2024-05-28 NOTE — ED Triage Notes (Signed)
Patient c/o cough and chest congestion that started yesterday.  Patient denies fevers.  

## 2024-05-28 NOTE — Telephone Encounter (Signed)
.  FYI Only or Action Required?: FYI only for provider: UC advised.  Patient was last seen in primary care on 04/30/2024 by Vincente Saber, NP.  Called Nurse Triage reporting Cough.  Symptoms began yesterday.  Interventions attempted: Nothing.  Symptoms are: gradually worsening.  Triage Disposition: See Physician Within 24 Hours  Patient/caregiver understands and will follow disposition?: Yes Reason for Disposition  [1] Continuous (nonstop) coughing interferes with work or school AND [2] no improvement using cough treatment per Care Advice  Answer Assessment - Initial Assessment Questions Patient reports 2 years ago had rhinovirus leading to SOB and was hospitalized. Granddaughter dx with the flu 3 days before christmas, patient was around her on 05/20/24. Advised UC for symptoms and for flu/covid testing, patient wanting cough medicine called in. Educated importance of being evaluated in person, patient agreeable.   1. ONSET: When did the cough begin?      Yesterday  2. SEVERITY: How bad is the cough today?      Getting worse  3. SPUTUM: Describe the color of your sputum (e.g., none, dry cough; clear, white, yellow, green)     White, chalky looking  4. HEMOPTYSIS: Are you coughing up any blood? If Yes, ask: How much? (e.g., flecks, streaks, tablespoons, etc.)     Denies  5. DIFFICULTY BREATHING: Are you having difficulty breathing? If Yes, ask: How bad is it? (e.g., mild, moderate, severe)      Not yet  6. FEVER: Do you have a fever? If Yes, ask: What is your temperature, how was it measured, and when did it start?     Denies  7. CARDIAC HISTORY: Do you have any history of heart disease? (e.g., heart attack, congestive heart failure)      Denies  8. LUNG HISTORY: Do you have any history of lung disease?  (e.g., pulmonary embolus, asthma, emphysema)     Denies  9. PE RISK FACTORS: Do you have a history of blood clots? (or: recent major surgery,  recent prolonged travel, bedridden)     DVT 2022  10. OTHER SYMPTOMS: Do you have any other symptoms? (e.g., runny nose, wheezing, chest pain)       Chest feels on fire when coughing. Nasal congestion that cleared when getting up this morning.  Protocols used: Cough - Acute Productive-A-AH  Copied from CRM #[id number]. Topic: Clinical - Red Word Triage >> May 28, 2024 10:35 AM Berneda FALCON wrote: Red Word that prompted transfer to Nurse Triage: Patient states she has a cough that began last night, but has no fever. Causing pain in the chest.Only other symptom was head feels stopped up but stopped this morning
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
## 2024-06-02 ENCOUNTER — Ambulatory Visit: Admitting: Internal Medicine

## 2024-06-02 ENCOUNTER — Ambulatory Visit: Admitting: Student in an Organized Health Care Education/Training Program

## 2024-06-14 ENCOUNTER — Ambulatory Visit: Admitting: Internal Medicine

## 2024-06-15 ENCOUNTER — Ambulatory Visit: Admitting: Nurse Practitioner

## 2024-06-18 NOTE — Progress Notes (Signed)
 Helen Benson                                          MRN: [id number]   06/18/2024   The [facility] Quality Team Specialist reviewed this patient medical record for the purposes of chart review for care gap closure. The following were reviewed: chart review for care gap closure-glycemic status assessment.    [facility] Quality Team

## 2024-06-23 ENCOUNTER — Telehealth: Payer: Self-pay | Admitting: *Deleted

## 2024-06-23 ENCOUNTER — Ambulatory Visit: Payer: 59 | Admitting: *Deleted

## 2024-06-23 VITALS — Ht 63.0 in | Wt 198.0 lb

## 2024-06-23 DIAGNOSIS — Z0000 Encounter for general adult medical examination without abnormal findings: Secondary | ICD-10-CM | POA: Diagnosis not present

## 2024-06-23 DIAGNOSIS — Z1211 Encounter for screening for malignant neoplasm of colon: Secondary | ICD-10-CM

## 2024-06-23 NOTE — Patient Instructions (Addendum)
 [name],  Thank you for taking the time for your Medicare Wellness Visit. I appreciate your continued commitment to your health goals. Please review the care plan we discussed, and feel free to reach out if I can assist you further.  Please note that Annual Wellness Visits do not include a physical exam. Some assessments may be limited, especially if the visit was conducted virtually. If needed, we may recommend an in-person follow-up with your provider.  Ongoing Care Seeing your primary care provider every 3 to 6 months helps us  monitor your health and provide consistent, personalized care.   Remember to call and schedule a diabetic eye exam and your lung cancer screening.  Make sure to update your tetanus and shingles vaccines at your pharmacy.   Managing Pain Without Opioids Opioids are strong medicines used to treat moderate to severe pain. For some people, especially those who have long-term (chronic) pain, opioids may not be the best choice for pain management due to: Side effects like nausea, constipation, and sleepiness. The risk of addiction (opioid use disorder). The longer you take opioids, the greater your risk of addiction. Pain that lasts for more than 3 months is called chronic pain. Managing chronic pain usually requires more than one approach and is often provided by a team of health care providers working together (multidisciplinary approach). Pain management may be done at a pain management center or pain clinic. How to manage pain without the use of opioids Use non-opioid medicines Non-opioid medicines for pain may include: Over-the-counter or prescription non-steroidal anti-inflammatory drugs (NSAIDs). These may be the first medicines used for pain. They work well for muscle and bone pain, and they reduce swelling. Acetaminophen . This over-the-counter medicine may work well for milder pain but not swelling. Antidepressants. These may be used to treat chronic pain. A  certain type of antidepressant (tricyclics) is often used. These medicines are given in lower doses for pain than when used for depression. Anticonvulsants. These are usually used to treat seizures but may also reduce nerve (neuropathic) pain. Muscle relaxants. These relieve pain caused by sudden muscle tightening (spasms). You may also use a pain medicine that is applied to the skin as a patch, cream, or gel (topical analgesic), such as a numbing medicine. These may cause fewer side effects than medicines taken by mouth. Do certain therapies as directed Some therapies can help with pain management. They include: Physical therapy. You will do exercises to gain strength and flexibility. A physical therapist may teach you exercises to move and stretch parts of your body that are weak, stiff, or painful. You can learn these exercises at physical therapy visits and practice them at home. Physical therapy may also involve: Massage. Heat wraps or applying heat or cold to affected areas. Electrical signals that interrupt pain signals (transcutaneous electrical nerve stimulation, TENS). Weak lasers that reduce pain and swelling (low-level laser therapy). Signals from your body that help you learn to regulate pain (biofeedback). Occupational therapy. This helps you to learn ways to function at home and work with less pain. Recreational therapy. This involves trying new activities or hobbies, such as a physical activity or drawing. Mental health therapy, including: Cognitive behavioral therapy (CBT). This helps you learn coping skills for dealing with pain. Acceptance and commitment therapy (ACT) to change the way you think and react to pain. Relaxation therapies, including muscle relaxation exercises and mindfulness-based stress reduction. Pain management counseling. This may be individual, family, or group counseling.  Receive medical treatments Medical treatments  for pain management include: Nerve  block injections. These may include a pain blocker and anti-inflammatory medicines. You may have injections: Near the spine to relieve chronic back or neck pain. Into joints to relieve back or joint pain. Into nerve areas that supply a painful area to relieve body pain. Into muscles (trigger point injections) to relieve some painful muscle conditions. A medical device placed near your spine to help block pain signals and relieve nerve pain or chronic back pain (spinal cord stimulation device). Acupuncture. Follow these instructions at home Medicines Take over-the-counter and prescription medicines only as told by your health care provider. If you are taking pain medicine, ask your health care providers about possible side effects to watch out for. Do not drive or use heavy machinery while taking prescription opioid pain medicine. Lifestyle  Do not use drugs or alcohol  to reduce pain. If you drink alcohol , limit how much you have to: 0-1 drink a day for women who are not pregnant. 0-2 drinks a day for men. Know how much alcohol  is in a drink. In the [place] one drink equals one 12 oz bottle of beer (355 mL), one 5 oz glass of wine (148 mL), or one 1 oz glass of hard liquor (44 mL). Do not use any products that contain nicotine  or tobacco. These products include cigarettes, chewing tobacco, and vaping devices, such as e-cigarettes. If you need help quitting, ask your health care provider. Eat a healthy diet and maintain a healthy weight. Poor diet and excess weight may make pain worse. Eat foods that are high in fiber. These include fresh fruits and vegetables, whole grains, and beans. Limit foods that are high in fat and processed sugars, such as fried and sweet foods. Exercise regularly. Exercise lowers stress and may help relieve pain. Ask your health care provider what activities and exercises are safe for you. If your health care provider approves, join an exercise class that combines  movement and stress reduction. Examples include yoga and tai chi. Get enough sleep. Lack of sleep may make pain worse. Lower stress as much as possible. Practice stress reduction techniques as told by your therapist. General instructions Work with all your pain management providers to find the treatments that work best for you. You are an important member of your pain management team. There are many things you can do to reduce pain on your own. Consider joining an online or in-person support group for people who have chronic pain. Keep all follow-up visits. This is important. Where to find more information You can find more information about managing pain without opioids from: American Academy of Pain Medicine: painmed.org Institute for Chronic Pain: instituteforchronicpain.org American Chronic Pain Association: theacpa.org Contact a health care provider if: You have side effects from pain medicine. Your pain gets worse or does not get better with treatments or home therapy. You are struggling with anxiety or depression. Summary Many types of pain can be managed without opioids. Chronic pain may respond better to pain management without opioids. Pain is best managed when you and a team of health care providers work together. Pain management without opioids may include non-opioid medicines, medical treatments, physical therapy, mental health therapy, and lifestyle changes. Tell your health care providers if your pain gets worse or is not being managed well enough. This information is not intended to replace advice given to you by your health care provider. Make sure you discuss any questions you have with your health care provider. Document Revised: 08/23/2020 Document  Reviewed: 08/23/2020 Elsevier Patient Education  2024 [employer] Inc[employer]  Referrals If a referral was made during today's visit and you haven't received any updates within two weeks, please contact the referred provider directly  to check on the status.  Recommended Screenings:  Health Maintenance  Topic Date Due   Pneumococcal Vaccine for age over 46 (1 of 2 - PCV) Never done   Zoster (Shingles) Vaccine (1 of 2) Never done   Colon Cancer Screening  Never done   DTaP/Tdap/Td vaccine (2 - Td or Tdap) 11/25/2020   Screening for Lung Cancer  02/27/2023   Complete foot exam   04/06/2023   Eye exam for diabetics  10/18/2023   Kidney health urinalysis for diabetes  06/10/2024   Hemoglobin A1C  06/10/2024   Yearly kidney function blood test for diabetes  12/08/2024   Medicare Annual Wellness Visit  06/23/2025   Breast Cancer Screening  10/20/2025   Flu Shot  Completed   Osteoporosis screening with Bone Density Scan  Completed   Hepatitis C Screening  Completed   Meningitis B Vaccine  Aged Out   Hepatitis B Vaccine  Discontinued   COVID-19 Vaccine  Discontinued       06/23/2024   11:42 AM  Advanced Directives  Does Patient Have a Medical Advance Directive? No  Would patient like information on creating a medical advance directive? --    Vision: Annual vision screenings are recommended for early detection of glaucoma, cataracts, and diabetic retinopathy. These exams can also reveal signs of chronic conditions such as diabetes and high blood pressure.  Dental: Annual dental screenings help detect early signs of oral cancer, gum disease, and other conditions linked to overall health, including heart disease and diabetes.  Please see the attached documents for additional preventive care recommendations.

## 2024-06-23 NOTE — Progress Notes (Signed)
 "  Chief Complaint  Patient presents with   Medicare Wellness     Subjective:   Helen Benson is a 69 y.o. female who presents for a Medicare Annual Wellness Visit.  Visit info / Clinical Intake: Medicare Wellness Visit Type:: Subsequent Annual Wellness Visit Persons participating in visit and providing information:: patient Medicare Wellness Visit Mode:: Telephone If telephone:: video declined Since this visit was completed virtually, some vitals may be partially provided or unavailable. Missing vitals are due to the limitations of the virtual format.: Unable to obtain vitals - no equipment (machine old not working properly) If Telephone or Video please confirm:: I connected with patient using audio/video enable telemedicine. I verified patient identity with two identifiers, discussed telehealth limitations, and patient agreed to proceed. Patient Location:: Home Provider Location:: Office/Home Interpreter Needed?: No Pre-visit prep was completed: yes AWV questionnaire completed by patient prior to visit?: no Living arrangements:: (!) lives alone Patient's Overall Health Status Rating: good Typical amount of pain: some (arthritis for years) Does pain affect daily life?: no Are you currently prescribed opioids?: (!) yes  Dietary Habits and Nutritional Risks How many meals a day?: 2 Eats fruit and vegetables daily?: yes Most meals are obtained by: preparing own meals In the last 2 weeks, have you had any of the following?: none Diabetic:: (!) yes Any non-healing wounds?: no How often do you check your BS?: 1 Would you like to be referred to a Nutritionist or for Diabetic Management? : no  Functional Status Activities of Daily Living (to include ambulation/medication): Independent Ambulation: Independent Medication Administration: Independent Home Management (perform basic housework or laundry): Independent Manage your own finances?: yes Primary transportation is:  driving Concerns about vision?: no *vision screening is required for WTM* Concerns about hearing?: no  Fall Screening Falls in the past year?: 0 Number of falls in past year: 0 Was there an injury with Fall?: 0 Fall Risk Category Calculator: 0 Patient Fall Risk Level: Low Fall Risk  Fall Risk Patient at Risk for Falls Due to: No Fall Risks Fall risk Follow up: Falls evaluation completed; Falls prevention discussed  Home and Transportation Safety: All rugs have non-skid backing?: N/A, no rugs All stairs or steps have railings?: yes Grab bars in the bathtub or shower?: yes Have non-skid surface in bathtub or shower?: yes Good home lighting?: yes Regular seat belt use?: yes Hospital stays in the last year:: no  Cognitive Assessment Difficulty concentrating, remembering, or making decisions? : no Will 6CIT or Mini Cog be Completed: yes What year is it?: 0 points What month is it?: 0 points Give patient an address phrase to remember (5 components): [address] [place] About what time is it?: 0 points Count backwards from 20 to 1: 0 points Say the months of the year in reverse: 0 points Repeat the address phrase from earlier: 2 points 6 CIT Score: 2 points  Advance Directives (For Healthcare) Does Patient Have a Medical Advance Directive?: No Would patient like information on creating a medical advance directive?: -- (will plan on picking up at the office next visit)  Reviewed/Updated  Reviewed/Updated: Reviewed All (Medical, Surgical, Family, Medications, Allergies, Care Teams, Patient Goals)    Allergies (verified) Gabapentin   Current Medications (verified) Outpatient Encounter Medications as of 06/23/2024  Medication Sig   ALPRAZolam  (XANAX ) 0.5 MG tablet TAKE 1 TABLET BY MOUTH TWICE DAILY AS NEEDED FOR ANXIETY   aspirin  EC 81 MG tablet Take 1 tablet (81 mg total) by mouth daily.  Swallow whole.   atenolol  (TENORMIN ) 25 MG tablet Take 1 tablet (25 mg total) by  mouth daily.   chlorthalidone  (HYGROTON ) 25 MG tablet Take 1 tablet (25 mg total) by mouth daily.   Cholecalciferol (VITAMIN D -3 PO) Take 1 capsule by mouth daily at 2 PM.   FARXIGA  10 MG TABS tablet Take 1 tablet (10 mg total) by mouth daily.   Loratadine (ALLERGY RELIEF 24-HR PO) Take 1 tablet by mouth daily at 2 PM. (Patient taking differently: Take 1 tablet by mouth daily as needed.)   Misc. Devices (FINGERTIP PULSE OXIMETER) MISC 1 each by Does not apply route as directed.   naloxone (NARCAN) nasal spray 4 mg/0.1 mL    olmesartan (BENICAR) 40 MG tablet Take 40 mg by mouth daily.   oxyCODONE  (ROXICODONE ) 15 MG immediate release tablet Take 15 mg by mouth.   OZEMPIC , 1 MG/DOSE, 4 MG/3ML SOPN INJECT 1MG  SUBCUTANEOUSLY ONCE A WEEK   rosuvastatin  (CRESTOR ) 20 MG tablet Take 1 tablet (20 mg total) by mouth daily.   spironolactone (ALDACTONE) 25 MG tablet Take 25 mg by mouth daily.   valACYclovir  (VALTREX ) 500 MG tablet Take 1 tablet (500 mg total) by mouth 3 (three) times daily.   betamethasone  dipropionate 0.05 % cream Apply topically 2 (two) times daily. (Patient not taking: Reported on 06/23/2024)   mupirocin  ointment (BACTROBAN ) 2 % Apply 1 Application topically 2 (two) times daily. (Patient not taking: Reported on 06/23/2024)   promethazine -dextromethorphan  (PROMETHAZINE -DM) 6.25-15 MG/5ML syrup Take 5 mLs by mouth 3 (three) times daily as needed for cough. (Patient not taking: Reported on 06/23/2024)   [DISCONTINUED] cephALEXin  (KEFLEX ) 500 MG capsule Take 1 capsule (500 mg total) by mouth 2 (two) times daily. (Patient not taking: Reported on 06/23/2024)   No facility-administered encounter medications on file as of 06/23/2024.    History: Past Medical History:  Diagnosis Date   Erythrocytosis    Hyperlipidemia    Hypertension    Kidney damage    Type 2 diabetes mellitus (HCC)    Past Surgical History:  Procedure Laterality Date   BREAST CYST ASPIRATION Bilateral    Bilat. many on  both per pt   BREAST CYST EXCISION Left    age 69   JOINT REPLACEMENT     Family History  Problem Relation Age of Onset   Breast cancer Sister 27   Breast cancer Other 6       neice   Social History   Occupational History   Not on file  Tobacco Use   Smoking status: Some Days    Current packs/day: 0.00    Average packs/day: 1.5 packs/day for 35.0 years (52.5 ttl pk-yrs)    Types: Cigarettes    Start date: 02/27/1987    Last attempt to quit: 02/26/2022    Years since quitting: 2.3   Smokeless tobacco: Never   Tobacco comments:    3 cigarette a day  Vaping Use   Vaping status: Never Used  Substance and Sexual Activity   Alcohol  use: No   Drug use: Never   Sexual activity: Yes   Tobacco Counseling Ready to quit: Yes Counseling given: Yes Tobacco comments: 3 cigarette a day  SDOH Screenings   Food Insecurity: No Food Insecurity (06/23/2024)  Recent Concern: Food Insecurity - Food Insecurity Present (06/10/2024)  Housing: Low Risk (06/23/2024)  Transportation Needs: No Transportation Needs (06/23/2024)  Recent Concern: Transportation Needs - Unmet Transportation Needs (06/10/2024)  Utilities: Not At Risk (06/23/2024)  Alcohol  Screen: Low  Risk (06/23/2024)  Depression (PHQ2-9): Low Risk (06/23/2024)  Financial Resource Strain: Low Risk (06/23/2024)  Physical Activity: Insufficiently Active (06/23/2024)  Social Connections: Socially Isolated (06/23/2024)  Stress: No Stress Concern Present (06/23/2024)  Tobacco Use: High Risk (06/23/2024)  Health Literacy: Adequate Health Literacy (06/23/2024)   See flowsheets for full screening details  Depression Screen PHQ 2 & 9 Depression Scale- Over the past 2 weeks, how often have you been bothered by any of the following problems? Little interest or pleasure in doing things: 0 Feeling down, depressed, or hopeless (PHQ Adolescent also includes...irritable): 0 PHQ-2 Total Score: 0 Trouble falling or staying asleep, or sleeping too much:  0 Feeling tired or having little energy: 2 Poor appetite or overeating (PHQ Adolescent also includes...weight loss): 0 Feeling bad about yourself - or that you are a failure or have let yourself or your family down: 0 Trouble concentrating on things, such as reading the newspaper or watching television (PHQ Adolescent also includes...like school work): 0 Moving or speaking so slowly that other people could have noticed. Or the opposite - being so fidgety or restless that you have been moving around a lot more than usual: 0 Thoughts that you would be better off dead, or of hurting yourself in some way: 0 PHQ-9 Total Score: 2 If you checked off any problems, how difficult have these problems made it for you to do your work, take care of things at home, or get along with other people?: Not difficult at all     Goals Addressed             This Visit's Progress    Patient Stated       Wants to lose weight             Objective:    Today's Vitals   06/23/24 1130  Weight: 198 lb (89.8 kg)  Height: 5' 3 (1.6 m)   Body mass index is 35.07 kg/m.  Hearing/Vision screen Hearing Screening - Comments:: No issues Vision Screening - Comments:: Glasses, [facility], overdue, Patient will call and schedule an eye appointment. Immunizations and Health Maintenance Health Maintenance  Topic Date Due   Pneumococcal Vaccine: 50+ Years (1 of 2 - PCV) Never done   Zoster Vaccines- Shingrix (1 of 2) Never done   Colonoscopy  Never done   DTaP/Tdap/Td (2 - Td or Tdap) 11/25/2020   Lung Cancer Screening  02/27/2023   FOOT EXAM  04/06/2023   OPHTHALMOLOGY EXAM  10/18/2023   Diabetic kidney evaluation - Urine ACR  06/10/2024   HEMOGLOBIN A1C  06/10/2024   Diabetic kidney evaluation - eGFR measurement  12/08/2024   Medicare Annual Wellness (AWV)  06/23/2025   Mammogram  10/20/2025   Influenza Vaccine  Completed   Bone Density Scan  Completed   Hepatitis C Screening  Completed    Meningococcal B Vaccine  Aged Out   Hepatitis B Vaccines 19-59 Average Risk  Discontinued   COVID-19 Vaccine  Discontinued        Assessment/Plan:  This is a routine wellness examination for Aleja.  Patient Care Team: Vincente Saber, NP as PCP - General (Nurse Practitioner) Argentina Clap, MD as PCP - Cardiology (Cardiology) [facility], [facility])  I have personally reviewed and noted the following in the patients chart:   Medical and social history Use of alcohol , tobacco or illicit drugs  Current medications and supplements including opioid prescriptions. Functional ability and status Nutritional status Physical activity Advanced directives List of  other physicians Hospitalizations, surgeries, and ER visits in previous 12 months Vitals Screenings to include cognitive, depression, and falls Referrals and appointments  No orders of the defined types were placed in this encounter.  In addition, I have reviewed and discussed with patient certain preventive protocols, quality metrics, and best practice recommendations. A written personalized care plan for preventive services as well as general preventive health recommendations were provided to patient.   Angeline Fredericks, LPN   [id number]/71/7973   Return in 1 year (on 06/23/2025).  After Visit Summary: (MyChart) Due to this being a telephonic visit, the after visit summary with patients personalized plan was offered to patient via MyChart   Nurse Notes: Discussed the need to update pneumonia, tetanus and shingle vaccines.  Patient needs foot exam, urine ACR and Hemoglobin A1C at next office visit. Patient stated that she was scheduled for a lung cancer screening and had to cancel due to the bad weather. Patient stated that she will call and reschedule lung cancer screening. Patient stated that she was scheduled for colonoscopy but never got a prep to do prior to the procedure. Phone note sent to PCP regarding colonoscopy.  "

## 2024-06-23 NOTE — Telephone Encounter (Signed)
 Performed AWV by telephone today.  Patient stated that she was scheduled for a colonoscopy recently in [place] but can not remember the name of the doctor. Patient stated that she never got the colon prep so she called and cancelled the procedure. Patient stated that she would like another referral but to a GI doctor in [place].

## 2024-06-24 ENCOUNTER — Other Ambulatory Visit: Payer: Self-pay | Admitting: Nurse Practitioner

## 2024-06-24 NOTE — Telephone Encounter (Signed)
 Noted! Thank you

## 2024-06-25 NOTE — Telephone Encounter (Signed)
 Refilled levothyroxine .

## 2024-07-06 ENCOUNTER — Ambulatory Visit: Admitting: Nurse Practitioner

## 2025-06-27 ENCOUNTER — Ambulatory Visit
